# Patient Record
Sex: Female | Born: 1965
Health system: Southern US, Community
[De-identification: ages and names within clinical notes are randomized; demographics above are authoritative.]

## PROBLEM LIST (undated history)

## (undated) DIAGNOSIS — R7303 Prediabetes: Secondary | ICD-10-CM

## (undated) DIAGNOSIS — R718 Other abnormality of red blood cells: Secondary | ICD-10-CM

## (undated) DIAGNOSIS — J45909 Unspecified asthma, uncomplicated: Secondary | ICD-10-CM

## (undated) DIAGNOSIS — E669 Obesity, unspecified: Secondary | ICD-10-CM

## (undated) DIAGNOSIS — D649 Anemia, unspecified: Secondary | ICD-10-CM

## (undated) DIAGNOSIS — R0600 Dyspnea, unspecified: Secondary | ICD-10-CM

## (undated) DIAGNOSIS — E039 Hypothyroidism, unspecified: Secondary | ICD-10-CM

## (undated) DIAGNOSIS — R739 Hyperglycemia, unspecified: Secondary | ICD-10-CM

## (undated) DIAGNOSIS — F329 Major depressive disorder, single episode, unspecified: Secondary | ICD-10-CM

## (undated) DIAGNOSIS — I10 Essential (primary) hypertension: Secondary | ICD-10-CM

## (undated) DIAGNOSIS — Z9221 Personal history of antineoplastic chemotherapy: Secondary | ICD-10-CM

## (undated) DIAGNOSIS — M62838 Other muscle spasm: Secondary | ICD-10-CM

## (undated) HISTORY — DX: Major depressive disorder, single episode, unspecified: F32.9

## (undated) HISTORY — DX: Hypothyroidism, unspecified: E03.9

## (undated) HISTORY — DX: Essential (primary) hypertension: I10

## (undated) HISTORY — DX: Obesity, unspecified: E66.9

## (undated) HISTORY — DX: Other abnormality of red blood cells: R71.8

## (undated) HISTORY — DX: Prediabetes: R73.03

## (undated) HISTORY — DX: Hyperglycemia, unspecified: R73.9

## (undated) HISTORY — DX: Unspecified asthma, uncomplicated: J45.909

## (undated) HISTORY — PX: ABDOMINAL HYSTERECTOMY: SHX81

---
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
## 2007-05-08 ENCOUNTER — Encounter: Admission: RE | Admit: 2007-05-08 | Discharge: 2007-05-08 | Payer: Self-pay | Admitting: Family Medicine

## 2007-07-31 ENCOUNTER — Encounter (INDEPENDENT_AMBULATORY_CARE_PROVIDER_SITE_OTHER): Payer: Self-pay | Admitting: Obstetrics & Gynecology

## 2007-07-31 ENCOUNTER — Ambulatory Visit (HOSPITAL_COMMUNITY): Admission: RE | Admit: 2007-07-31 | Discharge: 2007-08-01 | Payer: Self-pay | Admitting: Obstetrics & Gynecology

## 2011-02-22 NOTE — Op Note (Signed)
Veronica Velazquez, Veronica Velazquez             ACCOUNT NO.:  [id number]   MEDICAL RECORD NO.:  [id number]          PATIENT TYPE:  AMB   LOCATION:  DAY                          FACILITY:  [facility]   PHYSICIAN:  Genia Del, M.D.DATE OF BIRTH:  24-Mar-1966   DATE OF PROCEDURE:  07/31/2007  DATE OF DISCHARGE:                               OPERATIVE REPORT   PREOPERATIVE DIAGNOSIS:  Uterine myomas, menorrhagia, left pelvic pains  and right vaginal cyst.   POSTOPERATIVE DIAGNOSIS:  Uterine myomas, menorrhagia, left pelvic pains  and right vaginal cyst.   PROCEDURE:  Total laparoscopic hysterectomy assisted with Da vinci robot  and removal of right vaginal cyst.   SURGEON:  Dr. Genia Del   ASSISTANT:  Lendon Colonel, MD   PROCEDURE:  Under general anesthesia with endotracheal intubation, the  patient is in lithotomy position.  She is prepped with Betadine on the  abdominal, suprapubic, vulvar and vaginal areas and draped as usual.  The bladder is catheterized and the Foley is left in place.  We then do  a vaginal exam revealing an anteverted uterus about 15 cm, mobile, no  adnexal mass.  We put the speculum in the vagina.  The anterior lip of  the cervix was grasped with a tenaculum.  Dilatation of the cervix up to  Hegar dilator #25 easily.  We do a hysterometry which reveals an  intrauterine cavity at 10 cm, then #8 Rumi is used with the [facility] ring.  This is put in place as usual and a stitch is done between the cervix  and the [facility] ring.  We removed the speculum.  Note that the right vaginal  cyst is seen.  It measures about 4-5 cm in diameter.  It will be removed  at the end of the procedure so we go to abdominal time.  We marked the  skin above the umbilicus, to make an incision about 10 cm above the  fundus, we infiltrate Marcaine 0.25% plain.  We make a 1.5 cm incision  with a scalpel.  We opened the aponeurosis with Mayo scissors under  direct vision and the parietal peritoneum  bluntly with a finger.  A  pursestring stitch of Vicryl 0 is done at the aponeurosis.  The Hasson  is inserted and the camera at that level.  We created pneumoperitoneum.  We visualize the abdominopelvic cavities.  No adhesion is present.  The  uterus is large with an intramural myoma at the fundus probably  measuring around 10 cm.  We have other fibroids.  One is very close to  the cervix on the right lateral side and the one is more posteriorly to  the left.  We measured the trocar sites.  We make a W configuration.  Three robotic trocars are inserted in an assistant port.  All those  trocars are inserted under direct vision.  We then docked the robot as  usual and put the instruments in an Endo shear scissor in the right arm.  The bipolar fenestrated clamp in the left arm and the third robotic arm  has the Prograf.  We start at  the console.  We visualize both ureters,  they are normal anatomic position.  Both ovaries are normal in size and  appearance and they will be left in place.  We start on the right side.  We cauterize and section the right round ligament, the right tube and  the right utero-ovarian ligament.  We go down and stop before the  uterine artery.  We proceed the same way on the left side.  We then  opened the visceral peritoneum over the lower uterine segment and  reclined the bladder downward.  We then switched the third robotic arm  instrument to a tenaculum so that the fibroid on the right lateral side  close to the cervix can be grasped and lifted.  This way we can reach  the right uterine artery.  It is cauterized and sectioned.  We then  cauterize and section the left uterine artery.  The uterus is blanching.  We then opened the anterior vagina after pushing the [facility] ring in as much  as possible.  We used the Endo shears scissor for that part.  We go all  around in a circumferential incision and freed the uterus completely  that way. We then leave the uterus in and  close the vagina.  We change  instruments to the needle holders and the fenestrated bipolar.  We used  Vicryl 0 on a CT one.  We close the vagina in figure-of-eights.  Hemostasis is adequate at all levels.  We decide to morcellate the  uterus laparoscopically.  We therefore remove all instruments and  undocked the robot.  We then continued by laparoscopy with the  morcellator.  The uterus is easily morcellated.  The weight is about 600  grams.  We then irrigate and suction the abdominopelvic cavities.  Hemostasis is good at all levels.  We removed the instruments, removed  the trocars under direct vision.  The pursestring stitch is attached at  the supraumbilical incision.  We close the aponeurosis on the assistant  port with a Vicryl 0.  We close the supraumbilical and the assistant  port in a subcuticular stitch of Vicryl 4-0.  We then put Dermabond on  all incisions.  Hemostasis is adequate at those levels.  We then go to  vaginal time.  The occluder is removed from the vagina.  The patient is  repositioned.  We make a vertical incision at the junction of the hymen  and the vulva with the scalpel.  We then used Allises to hold the  vaginal mucosa.  We dissect the cyst which is relatively large measuring  4 to 5 cm.  At one point the cyst ruptures and a mucusy component is  evacuated.  We completely resect the cyst.  It is sent to pathology.  We  complete hemostasis at the base with figure-of-eight with Vicryl 2-0.  The electrocautery was also used for hemostasis.  We do a rectal exam to  assure that we are away from the rectum.  We then resect the excess  vaginal mucosa and close the vaginal mucosa with a locked running suture  of Vicryl 2-0.  Hemostasis was adequate.  The estimated blood loss was  350 mL total.  The count of instruments and gauzes was complete.  The  patient was brought to recovery room in good stable status.      Genia Del, M.D.  Electronically  Signed     ML/MEDQ  D:  07/31/2007  T:  08/01/2007  Job:  [id number] 

## 2011-07-20 LAB — CBC
HCT: 28.6 — ABNORMAL LOW
HCT: 39.3
Hemoglobin: 13.1
Hemoglobin: 9.6 — ABNORMAL LOW
MCHC: 33.2
RBC: 5.35 — ABNORMAL HIGH
WBC: 10.8 — ABNORMAL HIGH
WBC: 8.2

## 2011-07-20 LAB — COMPREHENSIVE METABOLIC PANEL
ALT: 13
AST: 16
CO2: 27
Chloride: 105
GFR calc Af Amer: >60
Sodium: 140
Total Bilirubin: 0.7

## 2011-07-20 LAB — PREGNANCY, URINE: Preg Test, Ur: NEGATIVE

## 2012-04-26 ENCOUNTER — Other Ambulatory Visit: Payer: Self-pay | Admitting: Internal Medicine

## 2012-08-09 ENCOUNTER — Other Ambulatory Visit: Payer: Self-pay | Admitting: Physician Assistant

## 2012-08-10 NOTE — Telephone Encounter (Signed)
Please call patient. Was told with refill in July they need an office visit. What is the follow up plan?

## 2012-08-17 ENCOUNTER — Telehealth: Payer: Self-pay

## 2012-08-17 NOTE — Telephone Encounter (Signed)
Pt is requesting symbicort refills  [phone]  Limited english  Pt is completely out of medication.   [phone] (H)

## 2012-08-18 NOTE — Telephone Encounter (Signed)
Pt husband called and upset because we have returned call on refilled.  Chart is at nurses station for review.  Pt has not been seen since 5/12

## 2012-08-18 NOTE — Telephone Encounter (Signed)
Patient needs an appointment for any additional refills. Not seen here May 2012.

## 2012-08-19 NOTE — Telephone Encounter (Signed)
Advised pt husband that they must be seen first before we can refill

## 2012-09-12 ENCOUNTER — Ambulatory Visit (INDEPENDENT_AMBULATORY_CARE_PROVIDER_SITE_OTHER): Payer: Managed Care, Other (non HMO) | Admitting: Physician Assistant

## 2012-09-12 VITALS — BP 140/84 | HR 86 | Temp 97.5°F | Resp 18 | Ht 61.5 in | Wt 234.0 lb

## 2012-09-12 DIAGNOSIS — E039 Hypothyroidism, unspecified: Secondary | ICD-10-CM

## 2012-09-12 DIAGNOSIS — F329 Major depressive disorder, single episode, unspecified: Secondary | ICD-10-CM

## 2012-09-12 DIAGNOSIS — Z0000 Encounter for general adult medical examination without abnormal findings: Secondary | ICD-10-CM

## 2012-09-12 DIAGNOSIS — J45909 Unspecified asthma, uncomplicated: Secondary | ICD-10-CM | POA: Insufficient documentation

## 2012-09-12 DIAGNOSIS — Z1231 Encounter for screening mammogram for malignant neoplasm of breast: Secondary | ICD-10-CM

## 2012-09-12 DIAGNOSIS — E669 Obesity, unspecified: Secondary | ICD-10-CM

## 2012-09-12 DIAGNOSIS — Z1239 Encounter for other screening for malignant neoplasm of breast: Secondary | ICD-10-CM

## 2012-09-12 DIAGNOSIS — E785 Hyperlipidemia, unspecified: Secondary | ICD-10-CM

## 2012-09-12 DIAGNOSIS — I10 Essential (primary) hypertension: Secondary | ICD-10-CM | POA: Insufficient documentation

## 2012-09-12 DIAGNOSIS — R718 Other abnormality of red blood cells: Secondary | ICD-10-CM | POA: Insufficient documentation

## 2012-09-12 LAB — POCT URINALYSIS DIPSTICK
Bilirubin, UA: NEGATIVE
Ketones, UA: NEGATIVE
Nitrite, UA: NEGATIVE
Protein, UA: NEGATIVE
Spec Grav, UA: >=1.03
Urobilinogen, UA: 0.2
pH, UA: 5.5

## 2012-09-12 LAB — POCT CBC
Granulocyte percent: 58.3 %G (ref 37–80)
Hemoglobin: 15 g/dL (ref 12.2–16.2)
MCH, POC: 24.6 pg — AB (ref 27–31.2)
MPV: 10.7 fL (ref 0–99.8)
POC Granulocyte: 5.4 (ref 2–6.9)
POC LYMPH PERCENT: 34.4 %L (ref 10–50)
POC MID %: 7.3 %M (ref 0–12)
Platelet Count, POC: 417 10*3/uL (ref 142–424)
RDW, POC: 15.8 %
WBC: 9.3 10*3/uL (ref 4.6–10.2)

## 2012-09-12 LAB — POCT UA - MICROSCOPIC ONLY: Casts, Ur, LPF, POC: NEGATIVE

## 2012-09-12 LAB — GLUCOSE, POCT (MANUAL RESULT ENTRY): POC Glucose: 89 mg/dl (ref 70–99)

## 2012-09-12 MED ORDER — SIMVASTATIN 10 MG PO TABS: 10.0000 mg | ORAL_TABLET | Freq: Every day | ORAL | Status: DC

## 2012-09-12 MED ORDER — ATENOLOL-CHLORTHALIDONE 100-25 MG PO TABS: 1.0000 | ORAL_TABLET | Freq: Every day | ORAL | Status: DC

## 2012-09-12 MED ORDER — ALBUTEROL SULFATE HFA 108 (90 BASE) MCG/ACT IN AERS: 2.0000 | INHALATION_SPRAY | RESPIRATORY_TRACT | Status: DC | PRN

## 2012-09-12 MED ORDER — BUDESONIDE-FORMOTEROL FUMARATE 160-4.5 MCG/ACT IN AERO: 2.0000 | INHALATION_SPRAY | Freq: Two times a day (BID) | RESPIRATORY_TRACT | Status: DC

## 2012-09-12 NOTE — Progress Notes (Signed)
Subjective:    Patient ID: Veronica Velazquez, female    DOB: 11-13-1965, 46 y.o.   MRN: [id number]  HPI This 46 y.o. female presents for Wellness Exam.  She is s/p hysterectomy, and does not want any type of vaginal exam. Her husband translates for [place].  Past Medical History  Diagnosis Date  . Hypertension   . Asthma Uses Symbicort "PRN"  . Hypothyroidism Not on treatment  . Depression Not on treatment, doesn't want it  . Obesity   . Microcytosis   . Hyperglycemia    Past Surgical History  Procedure Date  . Abdominal hysterectomy    Prior to Admission medications   Medication Sig Start Date End Date Taking? Authorizing Provider  atenolol-chlorthalidone (TENORETIC) 100-25 MG per tablet Take 1 tablet by mouth daily. 09/12/12  Yes Teriann Livingood S Sussan Meter, PA-C  budesonide-formoterol (SYMBICORT) 160-4.5 MCG/ACT inhaler Inhale 2 puffs into the lungs 2 (two) times daily. Needs office visit 09/12/12  Yes Delano Scardino S Amita Atayde, PA-C  simvastatin (ZOCOR) 10 MG tablet Take 1 tablet (10 mg total) by mouth at bedtime. 09/12/12  Yes Onyekachi Gathright S Naz Denunzio, PA-C  albuterol (PROVENTIL HFA;VENTOLIN HFA) 108 (90 BASE) MCG/ACT inhaler Inhale 2 puffs into the lungs every 4 (four) hours as needed for wheezing (cough, shortness of breath or wheezing.). 09/12/12   Jusiah Aguayo Tessa Lerner, PA-C   No Known Allergies  History   Social History  . Marital Status: Married    Spouse Name: Ghulam    Number of Children: 3  . Years of Education: 7   Occupational History  . homemaker    Social History Main Topics  . Smoking status: Never Smoker   . Smokeless tobacco: Never Used  . Alcohol Use: No  . Drug Use: No  . Sexually Active: Yes -- Female partner(s)   Other Topics Concern  . Not on file   Social History Narrative   Lives with her husband and two daughters.  They care for their 32 year-old granddaughter during the day while her mother (their daughter) works.    Family History  Problem Relation Age of Onset  . Diabetes  Brother     drug-induced    Review of Systems  Constitutional: Negative.   HENT: Negative.   Eyes: Negative.   Respiratory: Positive for cough, shortness of breath and wheezing.        Unclear how often this occurs-perhaps once a month.  However, it sounds as though she deals with a mild level of shortness of breath daily, which has become her "normal."  She uses Symbicort "PRN."  Cardiovascular: Negative.   Gastrointestinal: Positive for constipation. Negative for nausea, vomiting, abdominal pain, diarrhea, blood in stool, abdominal distention, anal bleeding and rectal pain.  Genitourinary: Negative.        Decreased libido since TAH.  She says that she enjoys sex, but is never interested in it.  Her husband would like to increase the frequency of sex, but she is reluctant.  Musculoskeletal: Negative.   Skin: Negative.   Neurological: Negative.   Hematological: Negative.   Psychiatric/Behavioral: Negative.        Objective:   Physical Exam  Vitals reviewed. Constitutional: She is oriented to person, place, and time. Vital signs are normal. She appears well-developed and well-nourished. No distress.  HENT:  Head: Normocephalic and atraumatic.  Right Ear: Hearing, tympanic membrane, external ear and ear canal normal. No foreign bodies.  Left Ear: Hearing, tympanic membrane, external ear and ear canal normal. No foreign  bodies.  Nose: Nose normal.  Mouth/Throat: Uvula is midline, oropharynx is clear and moist and mucous membranes are normal. No oral lesions. Normal dentition. No dental abscesses or uvula swelling. No oropharyngeal exudate.  Eyes: Conjunctivae normal and EOM are normal. Pupils are equal, round, and reactive to light. Right eye exhibits no discharge. Left eye exhibits no discharge. No scleral icterus.  Fundoscopic exam:      The right eye shows no arteriolar narrowing, no AV nicking, no exudate, no hemorrhage and no papilledema. The right eye shows red reflex.The right  eye shows no venous pulsations.      The left eye shows no arteriolar narrowing, no AV nicking, no exudate, no hemorrhage and no papilledema. The left eye shows red reflex.The left eye shows no venous pulsations. Neck: Trachea normal, normal range of motion and full passive range of motion without pain. Neck supple. No spinous process tenderness and no muscular tenderness present. No mass and no thyromegaly present.  Cardiovascular: Normal rate, regular rhythm, normal heart sounds, intact distal pulses and normal pulses.   Pulmonary/Chest: Effort normal and breath sounds normal. She exhibits no tenderness and no retraction. Right breast exhibits no inverted nipple, no mass, no nipple discharge, no skin change and no tenderness. Left breast exhibits no inverted nipple, no mass, no nipple discharge, no skin change and no tenderness. Breasts are symmetrical.  Abdominal: Soft. Normal appearance and bowel sounds are normal. She exhibits no distension and no mass. There is no hepatosplenomegaly. There is no tenderness. There is no rigidity, no rebound, no guarding, no CVA tenderness, no tenderness at McBurney's point and negative Murphy's sign. No hernia.  Musculoskeletal: She exhibits no edema and no tenderness.       Cervical back: Normal.       Thoracic back: Normal.       Lumbar back: Normal.  Lymphadenopathy:       Head (right side): No tonsillar, no preauricular, no posterior auricular and no occipital adenopathy present.       Head (left side): No tonsillar, no preauricular, no posterior auricular and no occipital adenopathy present.    She has no cervical adenopathy.    She has no axillary adenopathy.       Right: No supraclavicular adenopathy present.       Left: No supraclavicular adenopathy present.  Neurological: She is alert and oriented to person, place, and time. She has normal strength and normal reflexes. No cranial nerve deficit. She exhibits normal muscle tone. Coordination and gait  normal.  Skin: Skin is warm, dry and intact. No rash noted. She is not diaphoretic. No cyanosis or erythema. Nails show no clubbing.  Psychiatric: She has a normal mood and affect. Her speech is normal and behavior is normal. Judgment and thought content normal.    Results for orders placed in visit on 09/12/12  POCT CBC      Component Value Range   WBC 9.3  4.6 - 10.2 K/uL   Lymph, poc 3.2  0.6 - 3.4   POC LYMPH PERCENT 34.4  10 - 50 %L   MID (cbc) 0.7  0 - 0.9   POC MID % 7.3  0 - 12 %M   POC Granulocyte 5.4  2 - 6.9   Granulocyte percent 58.3  37 - 80 %G   RBC 6.09 (*) 4.04 - 5.48 M/uL   Hemoglobin 15.0  12.2 - 16.2 g/dL   HCT, POC 57.8 (*) 46.9 - 47.9 %   MCV 79.8 (*)  80 - 97 fL   MCH, POC 24.6 (*) 27 - 31.2 pg   MCHC 30.9 (*) 31.8 - 35.4 g/dL   RDW, POC 16.1     Platelet Count, POC 417  142 - 424 K/uL   MPV 10.7  0 - 99.8 fL  GLUCOSE, POCT (MANUAL RESULT ENTRY)      Component Value Range   POC Glucose 89  70 - 99 mg/dl  POCT UA - MICROSCOPIC ONLY      Component Value Range   WBC, Ur, HPF, POC 7-14     RBC, urine, microscopic 0-1     Bacteria, U Microscopic 1+     Mucus, UA moderate     Epithelial cells, urine per micros 2-6     Crystals, Ur, HPF, POC neg     Casts, Ur, LPF, POC neg     Yeast, UA neg    POCT URINALYSIS DIPSTICK      Component Value Range   Color, UA yellow     Clarity, UA slightly cloudy     Glucose, UA neg     Bilirubin, UA neg     Ketones, UA neg     Spec Grav, UA >=1.030     Blood, UA neg     pH, UA 5.5     Protein, UA neg     Urobilinogen, UA 0.2     Nitrite, UA neg     Leukocytes, UA small (1+)        Assessment & Plan:   1. Routine general medical examination at a health care facility  POCT glucose (manual entry), POCT UA - Microscopic Only, POCT urinalysis dipstick  2. Asthma  albuterol (PROVENTIL HFA;VENTOLIN HFA) 108 (90 BASE) MCG/ACT inhaler, budesonide-formoterol (SYMBICORT) 160-4.5 MCG/ACT inhaler  3. HTN (hypertension)  POCT  CBC, Comprehensive metabolic panel, atenolol-chlorthalidone (TENORETIC) 100-25 MG per tablet  4. Hypothyroidism  TSH  5. Depression  Declines treatment  6. Obesity  Encouraged lifestyle changes  7. Screening for breast cancer  MM Digital Screening  8. Hyperlipidemia  Lipid panel, simvastatin (ZOCOR) 10 MG tablet   Age appropriate anticipatory guidance provided. RTC 6 months, sooner pending lab results.

## 2012-09-12 NOTE — Patient Instructions (Signed)

## 2012-09-13 ENCOUNTER — Telehealth: Payer: Self-pay

## 2012-09-13 DIAGNOSIS — J45909 Unspecified asthma, uncomplicated: Secondary | ICD-10-CM

## 2012-09-13 LAB — COMPREHENSIVE METABOLIC PANEL
BUN: 13 mg/dL (ref 6–23)
Creat: 0.71 mg/dL (ref 0.50–1.10)
Sodium: 139 mEq/L (ref 135–145)

## 2012-09-13 LAB — LIPID PANEL
HDL: 52 mg/dL (ref >39)
LDL Cholesterol: 157 mg/dL — ABNORMAL HIGH (ref 0–99)
Total CHOL/HDL Ratio: 4.4 Ratio
Triglycerides: 110 mg/dL (ref <150)
VLDL: 22 mg/dL (ref 0–40)

## 2012-09-13 NOTE — Telephone Encounter (Signed)
Pt's pharmacy called and needs [place] to call in a 90 day rx for symbicort per her insurance requirement.  [facility] [address]

## 2012-09-14 ENCOUNTER — Encounter: Payer: Self-pay | Admitting: Physician Assistant

## 2012-09-14 MED ORDER — PRAVASTATIN SODIUM 20 MG PO TABS: 20.0000 mg | ORAL_TABLET | Freq: Every day | ORAL | Status: DC

## 2012-09-14 MED ORDER — BUDESONIDE-FORMOTEROL FUMARATE 160-4.5 MCG/ACT IN AERO: 2.0000 | INHALATION_SPRAY | Freq: Two times a day (BID) | RESPIRATORY_TRACT | Status: DC

## 2012-09-14 NOTE — Addendum Note (Signed)
Addended by: Fernande Bras on: 09/14/2012 07:48 PM   Modules accepted: Orders

## 2012-09-14 NOTE — Telephone Encounter (Signed)
Rx changed from 1 with 5 RF to 3 with 1 refill/ Asiah Befort

## 2012-10-05 ENCOUNTER — Other Ambulatory Visit: Payer: Self-pay | Admitting: *Deleted

## 2012-10-05 DIAGNOSIS — E785 Hyperlipidemia, unspecified: Secondary | ICD-10-CM

## 2012-10-05 MED ORDER — SIMVASTATIN 10 MG PO TABS: 10.0000 mg | ORAL_TABLET | Freq: Every day | ORAL | Status: DC

## 2012-10-10 ENCOUNTER — Other Ambulatory Visit: Payer: Self-pay | Admitting: Radiology

## 2012-12-11 ENCOUNTER — Other Ambulatory Visit: Payer: Self-pay

## 2012-12-11 DIAGNOSIS — I10 Essential (primary) hypertension: Secondary | ICD-10-CM

## 2013-03-30 ENCOUNTER — Other Ambulatory Visit: Payer: Self-pay | Admitting: Physician Assistant

## 2013-04-09 ENCOUNTER — Ambulatory Visit: Payer: Managed Care, Other (non HMO)

## 2013-04-09 ENCOUNTER — Ambulatory Visit (INDEPENDENT_AMBULATORY_CARE_PROVIDER_SITE_OTHER): Payer: Managed Care, Other (non HMO) | Admitting: Internal Medicine

## 2013-04-09 VITALS — BP 132/80 | HR 64 | Temp 98.2°F | Resp 18 | Ht 62.0 in | Wt 226.0 lb

## 2013-04-09 DIAGNOSIS — Z609 Problem related to social environment, unspecified: Secondary | ICD-10-CM

## 2013-04-09 DIAGNOSIS — R0689 Other abnormalities of breathing: Secondary | ICD-10-CM

## 2013-04-09 DIAGNOSIS — J45901 Unspecified asthma with (acute) exacerbation: Secondary | ICD-10-CM

## 2013-04-09 DIAGNOSIS — R0989 Other specified symptoms and signs involving the circulatory and respiratory systems: Secondary | ICD-10-CM

## 2013-04-09 DIAGNOSIS — I10 Essential (primary) hypertension: Secondary | ICD-10-CM

## 2013-04-09 DIAGNOSIS — J45909 Unspecified asthma, uncomplicated: Secondary | ICD-10-CM

## 2013-04-09 DIAGNOSIS — R0602 Shortness of breath: Secondary | ICD-10-CM

## 2013-04-09 DIAGNOSIS — Z79899 Other long term (current) drug therapy: Secondary | ICD-10-CM

## 2013-04-09 DIAGNOSIS — Z789 Other specified health status: Secondary | ICD-10-CM

## 2013-04-09 LAB — POCT CBC
Hemoglobin: 14.9 g/dL (ref 12.2–16.2)
MPV: 10.8 fL (ref 0–99.8)
RBC: 5.73 M/uL — AB (ref 4.04–5.48)
WBC: 8.9 10*3/uL (ref 4.6–10.2)

## 2013-04-09 MED ORDER — BUDESONIDE-FORMOTEROL FUMARATE 160-4.5 MCG/ACT IN AERO: 2.0000 | INHALATION_SPRAY | Freq: Two times a day (BID) | RESPIRATORY_TRACT | Status: DC

## 2013-04-09 MED ORDER — ALBUTEROL SULFATE (2.5 MG/3ML) 0.083% IN NEBU
2.5000 mg | INHALATION_SOLUTION | Freq: Once | RESPIRATORY_TRACT | Status: AC
  Administered 2013-04-09: 2.5 mg via RESPIRATORY_TRACT

## 2013-04-09 MED ORDER — ALBUTEROL SULFATE HFA 108 (90 BASE) MCG/ACT IN AERS: 2.0000 | INHALATION_SPRAY | RESPIRATORY_TRACT | Status: DC | PRN

## 2013-04-09 MED ORDER — IPRATROPIUM BROMIDE 0.02 % IN SOLN
0.5000 mg | Freq: Once | RESPIRATORY_TRACT | Status: AC
  Administered 2013-04-09: 0.5 mg via RESPIRATORY_TRACT

## 2013-04-09 MED ORDER — PREDNISONE 10 MG PO TABS: ORAL_TABLET | ORAL | Status: DC

## 2013-04-09 MED ORDER — METHYLPREDNISOLONE ACETATE 80 MG/ML IJ SUSP
80.0000 mg | Freq: Once | INTRAMUSCULAR | Status: AC
  Administered 2013-04-09: 80 mg via INTRAMUSCULAR

## 2013-04-09 NOTE — Patient Instructions (Signed)

## 2013-04-09 NOTE — Progress Notes (Signed)
  Subjective:    Patient ID: Veronica Velazquez, female    DOB: [birth year], 47 y.o.   MRN: [id number]  HPI 47 y.o. Female present today with sob due to asthma. Symptom has been bothering her for the past 3 days. Has tightness in chest.  Patient has used proair inhaler, symbicort today to ease symptoms, helped 2-3 hours before symptoms returned .    Review of Systems     Objective:   Physical Exam        Assessment & Plan:

## 2013-04-09 NOTE — Progress Notes (Signed)
  Subjective:    Patient ID: Veronica Velazquez, female    DOB: [birth year], 47 y.o.   MRN: [id number]  HPI 3d progressive sob, wheezing, speaks little english. Has asthma,  established provider here. She is on 100mg  atenolol a beta blocker, this needs to be reduced. Triggers for asthma are unclear. May have minor repiratory bug, may have allergy. Has these spells about once per month, Ms. Lannette Donath CPE reviewed in detail. Needs close f/up with her to be sure meds are right. Not using symbicort daily as ordered!, overusing proventil Daughter is helpful Review of Systems Htn, obesity, language issues    Objective:   Physical Exam  Vitals reviewed. Constitutional: She is oriented to person, place, and time. She appears well-nourished.  HENT:  Right Ear: External ear normal.  Left Ear: External ear normal.  Nose: Nose normal.  Mouth/Throat: Oropharynx is clear and moist.  Eyes: EOM are normal.  Neck: Neck supple.  Cardiovascular: Normal rate, regular rhythm and normal heart sounds.   Pulmonary/Chest: Tachypnea noted. She is in respiratory distress. She has wheezes. She has rhonchi. She has no rales.  Neurological: She is alert and oriented to person, place, and time. She exhibits normal muscle tone. Coordination normal.  Psychiatric: She has a normal mood and affect. Her behavior is normal.     PFR 80 l/min Nebulizer feels much better now, no tachypnea or cough PFR 110 L/min, looks better thanthis Do 2nd neb Results for orders placed in visit on 04/09/13  POCT CBC      Result Value Range   WBC 8.9  4.6 - 10.2 K/uL   Lymph, poc 2.5  0.6 - 3.4   POC LYMPH PERCENT 28.5  10 - 50 %L   MID (cbc) 0.6  0 - 0.9   POC MID % 6.9  0 - 12 %M   POC Granulocyte 5.7  2 - 6.9   Granulocyte percent 64.6  37 - 80 %G   RBC 5.73 (*) 4.04 - 5.48 M/uL   Hemoglobin 14.9  12.2 - 16.2 g/dL   HCT, POC 40.9  81.1 - 47.9 %   MCV 81.6  80 - 97 fL   MCH, POC 26.0 (*) 27 - 31.2 pg   MCHC 31.8  31.8 - 35.4  g/dL   RDW, POC 91.4     Platelet Count, POC 368  142 - 424 K/uL   MPV 10.8  0 - 99.8 fL  GLUCOSE, POCT (MANUAL RESULT ENTRY)      Result Value Range   POC Glucose 106 (*) 70 - 99 mg/dl   [facility] reading (PRIMARY) by  Dr Perrin Maltese normal cxr      Assessment & Plan:  Reduce Beta blocker to 1/2 atenolol/chlorthal 100/25 qd Depomedrol 120mg /Prednisone taper RF inhalers/Reviewed each and how to use with daughter and mother/patient RTC tomorrow reeval/To ER if worse tonite

## 2013-05-17 ENCOUNTER — Other Ambulatory Visit: Payer: Self-pay | Admitting: Physician Assistant

## 2013-05-20 ENCOUNTER — Other Ambulatory Visit: Payer: Self-pay

## 2013-05-20 MED ORDER — ATENOLOL-CHLORTHALIDONE 100-25 MG PO TABS: 0.5000 | ORAL_TABLET | Freq: Every day | ORAL | Status: DC

## 2013-08-27 ENCOUNTER — Ambulatory Visit: Payer: Self-pay

## 2013-09-29 ENCOUNTER — Other Ambulatory Visit: Payer: Self-pay | Admitting: Physician Assistant

## 2013-10-29 ENCOUNTER — Other Ambulatory Visit: Payer: Self-pay | Admitting: Physician Assistant

## 2014-01-25 ENCOUNTER — Ambulatory Visit (INDEPENDENT_AMBULATORY_CARE_PROVIDER_SITE_OTHER): Payer: Managed Care, Other (non HMO) | Admitting: Family Medicine

## 2014-01-25 VITALS — BP 138/82 | HR 78 | Temp 98.2°F | Resp 16 | Ht 62.0 in | Wt 228.6 lb

## 2014-01-25 DIAGNOSIS — I10 Essential (primary) hypertension: Secondary | ICD-10-CM

## 2014-01-25 DIAGNOSIS — E785 Hyperlipidemia, unspecified: Secondary | ICD-10-CM

## 2014-01-25 DIAGNOSIS — Z131 Encounter for screening for diabetes mellitus: Secondary | ICD-10-CM

## 2014-01-25 DIAGNOSIS — E039 Hypothyroidism, unspecified: Secondary | ICD-10-CM

## 2014-01-25 DIAGNOSIS — J45909 Unspecified asthma, uncomplicated: Secondary | ICD-10-CM

## 2014-01-25 LAB — POCT CBC
Granulocyte percent: 58.4 % (ref 37–80)
HCT, POC: 43.3 % (ref 37.7–47.9)
Hemoglobin: 13.7 g/dL (ref 12.2–16.2)
Lymph, poc: 2.4 (ref 0.6–3.4)
MCH, POC: 25.6 pg — AB (ref 27–31.2)
MCHC: 31.6 g/dL — AB (ref 31.8–35.4)
MCV: 80.7 fL (ref 80–97)
MID (cbc): 0.5 (ref 0–0.9)
MPV: 11.1 fL (ref 0–99.8)
POC Granulocyte: 4 (ref 2–6.9)
POC LYMPH PERCENT: 34.4 %L (ref 10–50)
POC MID %: 7.2 % (ref 0–12)
Platelet Count, POC: 351 10*3/uL (ref 142–424)
RBC: 5.36 M/uL (ref 4.04–5.48)
RDW, POC: 16.1 %
WBC: 6.9 10*3/uL (ref 4.6–10.2)

## 2014-01-25 LAB — POCT GLYCOSYLATED HEMOGLOBIN (HGB A1C): Hemoglobin A1C: 5.6

## 2014-01-25 MED ORDER — ALBUTEROL SULFATE HFA 108 (90 BASE) MCG/ACT IN AERS: 2.0000 | INHALATION_SPRAY | RESPIRATORY_TRACT | Status: DC | PRN

## 2014-01-25 MED ORDER — ATENOLOL-CHLORTHALIDONE 100-25 MG PO TABS: 0.5000 | ORAL_TABLET | Freq: Every day | ORAL | Status: DC

## 2014-01-25 MED ORDER — SIMVASTATIN 10 MG PO TABS: 10.0000 mg | ORAL_TABLET | Freq: Every day | ORAL | Status: DC

## 2014-01-25 MED ORDER — BUDESONIDE-FORMOTEROL FUMARATE 160-4.5 MCG/ACT IN AERO: 2.0000 | INHALATION_SPRAY | Freq: Two times a day (BID) | RESPIRATORY_TRACT | Status: DC

## 2014-01-25 NOTE — Progress Notes (Signed)
Chief Complaint:  Chief Complaint  Patient presents with  . medication refills    traveling to [place]. needs refills to cover 6-9 months; would like a refill on depression meds( long time ago) and levothyroxine (2013)  . medication refills    as well on medication list    HPI: Veronica Velazquez is a 48 y.o. female who is here for: She and her husband will be going to near [place], [place] for 1 month in 2 weeks.   1. HTN-on tenoretic 100/25 mg she takes 2-3 times per week. She does not take BP at home.  She works 11 hour shifts and soemtimes she forgets tot ake her meds because of tiredness.  2. Hyporthyroid -she has been out of her meds for 2-3 years, She was on Levothyroxine 50 mcg daily  3. Asthma sxs - she takes her albuterol inhaler prn, she does not usually take it more than 2-3 days every 4-6 months.  4. She is also noncompliant with lipitor 5. She has anxiety when she goes into a bug building sometimes, seh sits in the rooms she feels scared. She She denise shurting herself, eating more or sleeping more. 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 Hypertension   . Asthma   . Hypothyroidism   . Depression   . Obesity   . Microcytosis   . Hyperglycemia    Past Surgical History  Procedure Laterality Date  . Abdominal hysterectomy     History   Social History  . Marital Status: Married    Spouse Name: Veronica Velazquez    Number of Children: 3  . Years of Education: 7   Occupational History  . homemaker    Social History Main Topics  . Smoking status: Never Smoker   . Smokeless tobacco: Never Used  . Alcohol Use: No  . Drug Use: No  . Sexual Activity: Yes    Partners: Male   Other Topics Concern  . None   Social History Narrative   Lives with her husband and two daughters.  They care for their 86 year-old granddaughter during the day while her mother (their daughter) works.   Family History  Problem Relation  Age of Onset  . Diabetes Brother     drug-induced   No Known Allergies Prior to Admission medications   Medication Sig Start Date End Date Taking? Authorizing Provider  albuterol (PROVENTIL HFA;VENTOLIN HFA) 108 (90 BASE) MCG/ACT inhaler Inhale 2 puffs into the lungs every 4 (four) hours as needed for wheezing (cough, shortness of breath or wheezing.). 04/09/13  Yes Orma Flaming, MD  atenolol-chlorthalidone (TENORETIC) 100-25 MG per tablet Take 0.5 tablets by mouth daily. 05/20/13  Yes Chelle S Jeffery, PA-C  simvastatin (ZOCOR) 10 MG tablet Take 1 tablet (10 mg total) by mouth daily. PATIENT NEEDS OFFICE VISIT FOR ADDITIONAL REFILLS - 2nd NOTICE 10/29/13  Yes Ryan M Dunn, PA-C  budesonide-formoterol [facility]) 160-4.5 MCG/ACT inhaler Inhale 2 puffs into the lungs 2 (two) times daily. 04/09/13   Orma Flaming, MD  pravastatin (PRAVACHOL) 20 MG tablet Take 1 tablet (20 mg total) by mouth daily. 09/14/12   Chelle Janalee Dane, PA-C  predniSONE (DELTASONE) 10 MG tablet 6-6-5-5-4-4-3-3-2-2-1-1 po pc for breathing 04/09/13   Orma Flaming, MD     ROS: The patient denies fevers, chills, night sweats, unintentional weight loss, chest pain, palpitations, wheezing, dyspnea on exertion, nausea, vomiting, abdominal  pain, dysuria, hematuria, melena, numbness, weakness, or tingling.   All other systems have been reviewed and were otherwise negative with the exception of those mentioned in the HPI and as above.    PHYSICAL EXAM: Filed Vitals:   01/25/14 1728  BP: 138/82  Pulse: 78  Temp: 98.2 F (36.8 C)  Resp: 16   Filed Vitals:   01/25/14 1728  Height: 5\' 2"  (1.575 m)  Weight: 228 lb 9.6 oz (103.692 kg)   Body mass index is 41.8 kg/(m^2).  General: Alert, no acute distress HEENT:  Normocephalic, atraumatic, oropharynx patent. EOMI, PERRLA, no thrypidmegaly Cardiovascular:  Regular rate and rhythm, no rubs murmurs or gallops.  No Carotid bruits, radial pulse intact. No pedal edema.  Respiratory:  Clear to auscultation bilaterally.  No wheezes, rales, or rhonchi.  No cyanosis, no use of accessory musculature GI: No organomegaly, abdomen is soft and non-tender, positive bowel sounds.  No masses. Skin: No rashes. Neurologic: Facial musculature symmetric. Psychiatric: Patient is appropriate throughout our interaction. Lymphatic: No cervical lymphadenopathy Musculoskeletal: Gait intact.   LABS: Results for orders placed in visit on 01/25/14  POCT CBC      Result Value Ref Range   WBC 6.9  4.6 - 10.2 K/uL   Lymph, poc 2.4  0.6 - 3.4   POC LYMPH PERCENT 34.4  10 - 50 %L   MID (cbc) 0.5  0 - 0.9   POC MID % 7.2  0 - 12 %M   POC Granulocyte 4.0  2 - 6.9   Granulocyte percent 58.4  37 - 80 %G   RBC 5.36  4.04 - 5.48 M/uL   Hemoglobin 13.7  12.2 - 16.2 g/dL   HCT, POC 43.3  37.7 - 47.9 %   MCV 80.7  80 - 97 fL   MCH, POC 25.6 (*) 27 - 31.2 pg   MCHC 31.6 (*) 31.8 - 35.4 g/dL   RDW, POC 16.1     Platelet Count, POC 351  142 - 424 K/uL   MPV 11.1  0 - 99.8 fL  POCT GLYCOSYLATED HEMOGLOBIN (HGB A1C)      Result Value Ref Range   Hemoglobin A1C 5.6       EKG/XRAY:   Primary read interpreted by Dr. Marin Comment at [facility].   ASSESSMENT/PLAN: Encounter Diagnoses  Name Primary?  . HTN (hypertension) Yes  . Other and unspecified hyperlipidemia   . Unspecified hypothyroidism   . Screening for diabetes mellitus   . Unspecified asthma(493.90)    Noncompliant [name] female who is here for med refills , she will be leaving for [place] and would like her medicines. She will be gone for over 1 month.  Refilled meds Will hold off on thyroid medicine since she has not taken it in 2-3 years and I do not know where her TSH level is F/u prn, otherwise in 3 months for fasting blood work when she return from [place]  Gross sideeffects, risk and benefits, and alternatives of medications d/w patient. Patient is aware that all medications have potential sideeffects and we are unable to predict every  sideeffect or drug-drug interaction that may occur.  Glenford Bayley, DO 01/25/2014 6:49 PM

## 2014-01-26 LAB — COMPREHENSIVE METABOLIC PANEL
AST: 14 U/L (ref 0–37)
Albumin: 4 g/dL (ref 3.5–5.2)
Alkaline Phosphatase: 81 U/L (ref 39–117)
BUN: 13 mg/dL (ref 6–23)
Creat: 0.6 mg/dL (ref 0.50–1.10)

## 2014-01-26 LAB — COMPREHENSIVE METABOLIC PANEL WITH GFR
ALT: 10 U/L (ref 0–35)
CO2: 26 meq/L (ref 19–32)
Calcium: 9.2 mg/dL (ref 8.4–10.5)
Chloride: 104 meq/L (ref 96–112)
Glucose, Bld: 103 mg/dL — ABNORMAL HIGH (ref 70–99)
Potassium: 3.9 meq/L (ref 3.5–5.3)
Sodium: 139 meq/L (ref 135–145)
Total Bilirubin: 0.3 mg/dL (ref 0.2–1.2)
Total Protein: 6.8 g/dL (ref 6.0–8.3)

## 2014-01-26 LAB — TSH: TSH: 7.266 u[IU]/mL — ABNORMAL HIGH (ref 0.350–4.500)

## 2014-01-26 LAB — LDL CHOLESTEROL, DIRECT: Direct LDL: 162 mg/dL — ABNORMAL HIGH

## 2014-01-28 ENCOUNTER — Telehealth: Payer: Self-pay

## 2014-01-28 MED ORDER — FLUTICASONE-SALMETEROL 100-50 MCG/DOSE IN AEPB: 1.0000 | INHALATION_SPRAY | Freq: Two times a day (BID) | RESPIRATORY_TRACT | Status: DC

## 2014-01-28 NOTE — Telephone Encounter (Signed)
Lets try advair, she has been on symbicort prior

## 2014-01-28 NOTE — Addendum Note (Signed)
Addended by: Elwyn Reach A on: 01/28/2014 03:29 PM   Modules accepted: Orders

## 2014-01-28 NOTE — Telephone Encounter (Signed)
Notified pt on VM about change in medication and new Rx sent in.

## 2014-01-28 NOTE — Telephone Encounter (Signed)
PA is needed for symbicort. When I completed form on covermymeds, it advised that pt must have tried/failed or have contraindication for trying both of the preferred alternatives before PA would be approved. Pt, according to EPIC and paper records has only tried Asmanex (which is not even one of the preferred) before symbicort. The two altern are Advair and Dulera. Dr Marin Comment, do you want to Rx one of these?

## 2014-01-29 ENCOUNTER — Telehealth: Payer: Self-pay

## 2014-01-29 DIAGNOSIS — F411 Generalized anxiety disorder: Secondary | ICD-10-CM

## 2014-01-29 DIAGNOSIS — E039 Hypothyroidism, unspecified: Secondary | ICD-10-CM

## 2014-01-29 NOTE — Telephone Encounter (Signed)
PATIENTS DAUGHTER CALLED AND ASKED FOR REFILL ON PRESCRIPTION FOR DEPRESSION MEDICATION AND THYROID MEDICATION, DAUGHTER DOES NOT KNOW WHAT THE NAMES OF THESE MEDICATIONS ARE.

## 2014-01-30 ENCOUNTER — Encounter: Payer: Self-pay | Admitting: Family Medicine

## 2014-01-30 MED ORDER — HYDROXYZINE HCL 10 MG PO TABS: 10.0000 mg | ORAL_TABLET | Freq: Three times a day (TID) | ORAL | Status: DC | PRN

## 2014-01-30 MED ORDER — LEVOTHYROXINE SODIUM 25 MCG PO TABS: ORAL_TABLET | ORAL | Status: DC

## 2014-01-30 NOTE — Telephone Encounter (Signed)
LM on phone re labs and paln. I do not want her to be on depression meds at this time, she has anxiety issues and she is noncomplaint. Will put her on low dose Levothryoxine and see how she does. She was previously on 50 mcg based on a old tattered bottle that she had, I will start her on 25 mcg and see how she does since she is noncompliant and will be traveling. She has never been on depression medicine. We briefly talked about anxiety and I think she has more anxiety about certain things. I can give her  vistaril for now so she can take as needed. In 2 months when she returns for fasting blood owrk for lipids, TSH and also anxiety/depression re-evaluation.

## 2014-01-30 NOTE — Telephone Encounter (Signed)
Pt has not ever been prescribed an antidepressive or thyroid medication through our office.  I do see in the the last OV Dr. Marin Comment was going to refill these medications. Please advise.

## 2014-01-30 NOTE — Telephone Encounter (Signed)
Dr. Marin Comment - this pt is requesting RF of thyroid and depression medication.  TSH was elevated at 4/18 visit, but I don't see where you discussed depression medication, and I don't see any antidepressants on her med list.  Please advise

## 2014-04-29 ENCOUNTER — Other Ambulatory Visit: Payer: Self-pay | Admitting: Family Medicine

## 2014-05-24 ENCOUNTER — Other Ambulatory Visit: Payer: Self-pay | Admitting: Family Medicine

## 2014-06-01 ENCOUNTER — Ambulatory Visit (INDEPENDENT_AMBULATORY_CARE_PROVIDER_SITE_OTHER): Payer: Managed Care, Other (non HMO) | Admitting: Family Medicine

## 2014-06-01 VITALS — BP 130/88 | HR 65 | Temp 97.8°F | Resp 16 | Ht 62.0 in | Wt 224.0 lb

## 2014-06-01 DIAGNOSIS — I10 Essential (primary) hypertension: Secondary | ICD-10-CM

## 2014-06-01 DIAGNOSIS — Z8249 Family history of ischemic heart disease and other diseases of the circulatory system: Secondary | ICD-10-CM

## 2014-06-01 DIAGNOSIS — E785 Hyperlipidemia, unspecified: Secondary | ICD-10-CM

## 2014-06-01 DIAGNOSIS — B351 Tinea unguium: Secondary | ICD-10-CM

## 2014-06-01 DIAGNOSIS — Z23 Encounter for immunization: Secondary | ICD-10-CM

## 2014-06-01 DIAGNOSIS — M79676 Pain in unspecified toe(s): Secondary | ICD-10-CM

## 2014-06-01 DIAGNOSIS — J45909 Unspecified asthma, uncomplicated: Secondary | ICD-10-CM

## 2014-06-01 DIAGNOSIS — M79609 Pain in unspecified limb: Secondary | ICD-10-CM

## 2014-06-01 DIAGNOSIS — E669 Obesity, unspecified: Secondary | ICD-10-CM

## 2014-06-01 LAB — COMPREHENSIVE METABOLIC PANEL
ALK PHOS: 72 U/L (ref 39–117)
ALT: 10 U/L (ref 0–35)
AST: 13 U/L (ref 0–37)
Albumin: 3.9 g/dL (ref 3.5–5.2)
BUN: 10 mg/dL (ref 6–23)
CO2: 25 mEq/L (ref 19–32)
Calcium: 9.4 mg/dL (ref 8.4–10.5)
Chloride: 103 mEq/L (ref 96–112)
Creat: 0.52 mg/dL (ref 0.50–1.10)
Glucose, Bld: 111 mg/dL — ABNORMAL HIGH (ref 70–99)
POTASSIUM: 3.6 meq/L (ref 3.5–5.3)
SODIUM: 137 meq/L (ref 135–145)
TOTAL PROTEIN: 6.6 g/dL (ref 6.0–8.3)
Total Bilirubin: 0.5 mg/dL (ref 0.2–1.2)

## 2014-06-01 LAB — LIPID PANEL
CHOL/HDL RATIO: 3.7 ratio
Cholesterol: 194 mg/dL (ref 0–200)
HDL: 53 mg/dL (ref >39)
LDL Cholesterol: 118 mg/dL — ABNORMAL HIGH (ref 0–99)
Triglycerides: 114 mg/dL (ref <150)
VLDL: 23 mg/dL (ref 0–40)

## 2014-06-01 LAB — TSH: TSH: 4.442 u[IU]/mL (ref 0.350–4.500)

## 2014-06-01 NOTE — Patient Instructions (Addendum)
I am so sorry about the loss of your brother!  We will refer you to cardiology to make sure that your heart is doing well. They will likely do a "stress test" of your heart to look for any early blockages in your heart arteries.  If you are not able to make your cardiology appt you may certainly call and reschedule.    As soon as you are cleared by cardiology I want you to start an exercise program of walking 20- 30 minutes a day (working up to more) most days of the week.  We will also discuss your other labs and any medication changes that are needed.   Assuming your liver blood tests look ok I will call in lamisil for your toenails- you can take this for 12 weeks.   We will likely also have you go ahead and start taking medication for your thyroid, but I will call you with these results first.

## 2014-06-01 NOTE — Progress Notes (Addendum)
[facility] [address] [place] [address] [phone]  Date:  06/01/2014   Name:  Veronica Velazquez   DOB:  03/04/1966   MRN:  [id number]  PCP:  No PCP Per Patient    Chief Complaint: Hypertension   History of Present Illness:  Veronica Velazquez is a 48 y.o. very pleasant female patient who presents with the following:  She is here today for follow-up.  She was here a few months ago for medication refills. She takes medications for HTN. She also has been told she is at risk for DM but does not actually have DM.  There is a family history of DM.  Most recent A1c was 5.6 in April.  Also, she has levothyroxine but does not take it.  Her brother died 3 weeks ago of an MI.  There have been other cases of early CAD in her family.  This has her concerned and she would like to make sure that her heart is ok.   She is fasting this am.   No chest pain as far as we know.  She does not get much exercise.    Veronica Velazquez is from [place], her english is limited.  Her daughter helps with translation today.   Patient Active Problem List   Diagnosis Date Noted  . Asthma 09/12/2012  . HTN (hypertension) 09/12/2012  . Hypothyroidism   . Depression   . Obesity   . Microcytosis     Past Medical History  Diagnosis Date  . Hypertension   . Asthma   . Hypothyroidism   . Depression   . Obesity   . Microcytosis   . Hyperglycemia     Past Surgical History  Procedure Laterality Date  . Abdominal hysterectomy      History  Substance Use Topics  . Smoking status: Never Smoker   . Smokeless tobacco: Never Used  . Alcohol Use: No    Family History  Problem Relation Age of Onset  . Diabetes Brother     drug-induced    No Known Allergies  Medication list has been reviewed and updated.  Current Outpatient Prescriptions on File Prior to Visit  Medication Sig Dispense Refill  . atenolol-chlorthalidone (TENORETIC) 100-25 MG per tablet Take 0.5 tablets by mouth  daily.  90 tablet  1  . Fluticasone-Salmeterol (ADVAIR) 100-50 MCG/DOSE AEPB Inhale 1 puff into the lungs 2 (two) times daily.  3 each  1  . levothyroxine (SYNTHROID, LEVOTHROID) 25 MCG tablet Take 1 tablet (25 mcg total) by mouth daily before breakfast. Do not take with other meds. PATIENT NEEDS OFFICE VISIT/LABS FOR ADDITIONAL REFILLS  30 tablet  0  . levothyroxine (SYNTHROID, LEVOTHROID) 25 MCG tablet TAKE 1 TAB DAILY IN THE MORNING WITH WATER ON AN EMPTY STOMACH, DO NOT TAKE WITH OTHER MEDS  90 tablet  0  . simvastatin (ZOCOR) 10 MG tablet Take 1 tablet (10 mg total) by mouth daily.  90 tablet  1  . albuterol (PROVENTIL HFA;VENTOLIN HFA) 108 (90 BASE) MCG/ACT inhaler Inhale 2 puffs into the lungs every 4 (four) hours as needed for wheezing (cough, shortness of breath or wheezing.).  1 Inhaler  5  . budesonide-formoterol (SYMBICORT) 160-4.5 MCG/ACT inhaler Inhale 2 puffs into the lungs 2 (two) times daily.  3 Inhaler  1  . hydrOXYzine (ATARAX/VISTARIL) 10 MG tablet Take 1 tablet (10 mg total) by mouth 3 (three) times daily as needed. For anxiety. May make you sleepy.  [place]  tablet  2   No current facility-administered medications on file prior to visit.    Review of Systems:  As per HPI- otherwise negative.   Physical Examination: Filed Vitals:   06/01/14 0827  BP: 130/88  Pulse: 65  Temp: 97.8 F (36.6 C)  Resp: 16   Filed Vitals:   06/01/14 0827  Height: 5\' 2"  (1.575 m)  Weight: 224 lb (101.606 kg)   Body mass index is 40.96 kg/(m^2). Ideal Body Weight: Weight in (lb) to have BMI = 25: 136.4  GEN: WDWN, NAD, Non-toxic, A & O x 3, obese, looks well but is still tearful when thinking about the death of her brother.   HEENT: Atraumatic, Normocephalic. Neck supple. No masses, No LAD. Ears and Nose: No external deformity. CV: RRR, No M/G/R. No JVD. No thrill. No extra heart sounds. PULM: CTA B, no wheezes, crackles, rhonchi. No retractions. No resp. distress. No accessory muscle  use. ABD: S, NT, ND EXTR: No c/c/e NEURO Normal gait.  PSYCH: Normally interactive. Conversant. Not depressed or anxious appearing.  Calm demeanor.  She is also concerned about some thickened, crumbling toenails.  Mostly the great toes are affected.  They can be uncomfortable for her   Assessment and Plan: Family history of premature CAD - Plan: Lipid panel, Ambulatory referral to Cardiology  Obesity, unspecified - Plan: TSH, Ambulatory referral to Cardiology  Essential hypertension - Plan: TSH, Ambulatory referral to Cardiology  Immunization due - Plan: Flu Vaccine QUAD 36+ mos IM  Pain due to onychomycosis of toenail - Plan: Comprehensive metabolic panel  Will refer to cardiology for possible stress test given her risk factors and fhx of early CAD BP control is ok, refill medications Check lipids Flu shot.   She would like to take lamisil for fungal toenails- assuming her LFTS are ok I will rx this for her See patient instructions for more details.    Signed Lamar Blinks, MD  [phone]- called 8/24 and spoke with her daughter.  See lab letter.  Ok to use lamisil for her toenails for 12 weeks. Will send in all refills as requested.

## 2014-06-02 ENCOUNTER — Encounter: Payer: Self-pay | Admitting: Family Medicine

## 2014-06-02 MED ORDER — ATENOLOL-CHLORTHALIDONE 100-25 MG PO TABS: 0.5000 | ORAL_TABLET | Freq: Every day | ORAL | Status: DC

## 2014-06-02 MED ORDER — TERBINAFINE HCL 250 MG PO TABS: 250.0000 mg | ORAL_TABLET | Freq: Every day | ORAL | Status: DC

## 2014-06-02 MED ORDER — FLUTICASONE-SALMETEROL 100-50 MCG/DOSE IN AEPB: 1.0000 | INHALATION_SPRAY | Freq: Two times a day (BID) | RESPIRATORY_TRACT | Status: DC

## 2014-06-02 MED ORDER — ALBUTEROL SULFATE HFA 108 (90 BASE) MCG/ACT IN AERS: 2.0000 | INHALATION_SPRAY | RESPIRATORY_TRACT | Status: DC | PRN

## 2014-06-02 MED ORDER — SIMVASTATIN 10 MG PO TABS: 10.0000 mg | ORAL_TABLET | Freq: Every day | ORAL | Status: DC

## 2014-06-02 NOTE — Addendum Note (Signed)
Addended by: Lamar Blinks C on: 06/02/2014 09:29 AM   Modules accepted: Orders, Medications

## 2014-08-28 ENCOUNTER — Other Ambulatory Visit: Payer: Self-pay | Admitting: Family Medicine

## 2014-10-17 ENCOUNTER — Other Ambulatory Visit: Payer: Self-pay | Admitting: Family Medicine

## 2015-02-03 ENCOUNTER — Other Ambulatory Visit: Payer: Self-pay | Admitting: Physician Assistant

## 2015-02-06 ENCOUNTER — Other Ambulatory Visit: Payer: Self-pay | Admitting: Family Medicine

## 2015-03-06 ENCOUNTER — Other Ambulatory Visit: Payer: Self-pay | Admitting: Physician Assistant

## 2015-03-26 ENCOUNTER — Other Ambulatory Visit: Payer: Self-pay | Admitting: Physician Assistant

## 2015-05-15 ENCOUNTER — Ambulatory Visit (INDEPENDENT_AMBULATORY_CARE_PROVIDER_SITE_OTHER): Payer: Managed Care, Other (non HMO) | Admitting: Family Medicine

## 2015-05-15 VITALS — BP 120/68 | HR 69 | Temp 97.4°F | Resp 18 | Ht 62.5 in | Wt 230.0 lb

## 2015-05-15 DIAGNOSIS — Z130 Encounter for screening for diseases of the blood and blood-forming organs and certain disorders involving the immune mechanism: Secondary | ICD-10-CM | POA: Diagnosis not present

## 2015-05-15 DIAGNOSIS — I10 Essential (primary) hypertension: Secondary | ICD-10-CM | POA: Diagnosis not present

## 2015-05-15 DIAGNOSIS — J4520 Mild intermittent asthma, uncomplicated: Secondary | ICD-10-CM

## 2015-05-15 DIAGNOSIS — E785 Hyperlipidemia, unspecified: Secondary | ICD-10-CM

## 2015-05-15 DIAGNOSIS — M791 Myalgia, unspecified site: Secondary | ICD-10-CM

## 2015-05-15 LAB — COMPREHENSIVE METABOLIC PANEL
ALT: 13 U/L (ref 6–29)
AST: 16 U/L (ref 10–35)
Albumin: 3.9 g/dL (ref 3.6–5.1)
Alkaline Phosphatase: 72 U/L (ref 33–130)
BUN: 20 mg/dL (ref 7–25)
CO2: 26 mmol/L (ref 20–31)
Calcium: 9.7 mg/dL (ref 8.6–10.4)
Chloride: 102 mmol/L (ref 98–110)
Creat: 0.63 mg/dL (ref 0.50–1.05)
Glucose, Bld: 90 mg/dL (ref 65–99)
Potassium: 4.2 mmol/L (ref 3.5–5.3)
Sodium: 136 mmol/L (ref 135–146)
Total Bilirubin: 0.4 mg/dL (ref 0.2–1.2)
Total Protein: 6.9 g/dL (ref 6.1–8.1)

## 2015-05-15 LAB — LIPID PANEL
Cholesterol: 240 mg/dL — ABNORMAL HIGH (ref 125–200)
HDL: 55 mg/dL (ref >=46)
LDL Cholesterol: 145 mg/dL — ABNORMAL HIGH (ref <130)
Total CHOL/HDL Ratio: 4.4 Ratio (ref <=5.0)
Triglycerides: 200 mg/dL — ABNORMAL HIGH (ref <150)
VLDL: 40 mg/dL — ABNORMAL HIGH (ref <30)

## 2015-05-15 LAB — CBC
HCT: 38.9 % (ref 36.0–46.0)
Hemoglobin: 13.3 g/dL (ref 12.0–15.0)
MCH: 26 pg (ref 26.0–34.0)
MCHC: 34.2 g/dL (ref 30.0–36.0)
MCV: 76 fL — AB (ref 78.0–100.0)
MPV: 10.2 fL (ref 8.6–12.4)
Platelets: 332 10*3/uL (ref 150–400)
RBC: 5.12 MIL/uL — ABNORMAL HIGH (ref 3.87–5.11)
RDW: 14.9 % (ref 11.5–15.5)
WBC: 8.8 10*3/uL (ref 4.0–10.5)

## 2015-05-15 LAB — CK: CK TOTAL: 102 U/L (ref 7–177)

## 2015-05-15 MED ORDER — LISINOPRIL 5 MG PO TABS: 5.0000 mg | ORAL_TABLET | Freq: Every day | ORAL | Status: DC

## 2015-05-15 MED ORDER — ALBUTEROL SULFATE HFA 108 (90 BASE) MCG/ACT IN AERS: 2.0000 | INHALATION_SPRAY | RESPIRATORY_TRACT | Status: DC | PRN

## 2015-05-15 MED ORDER — ATENOLOL-CHLORTHALIDONE 100-25 MG PO TABS: ORAL_TABLET | ORAL | Status: DC

## 2015-05-15 MED ORDER — SIMVASTATIN 10 MG PO TABS: 10.0000 mg | ORAL_TABLET | Freq: Every day | ORAL | Status: DC

## 2015-05-15 NOTE — Patient Instructions (Signed)
We will refer you to physical therapy to work on your left shoulder stiffness I will be in touch with your labs Continue your current medications for your blood pressure and cholesterol  It would also be a good idea to schedule a complete physical soon to go over your general health   Please call and schedule a mammogram.  There are several options in town, one good choice is:  [facility] ?  Address: [address], [place], [place] [address]  [phone]

## 2015-05-15 NOTE — Progress Notes (Signed)
[facility] [address], [place] [address] [phone]  Date:  05/15/2015   Name:  Veronica Velazquez   DOB:  [birth year]   MRN:  [id number]  PCP:  No PCP Per Patient    Chief Complaint: Medication Refill   History of Present Illness:  Veronica Velazquez is a 49 y.o. very pleasant female patient who presents with the following:  She is here for refills today.  Doing well.  She has had tea and an orange so far today Dr. Einar Gip has her on lisinopril 10 mg as well- needs this refilled She uses albuterol maybe once a month for wheezing- she is NOT using advair She otherwise has no complaints today No recent CPE She also has noted some aching in her bilateral upper arms for a few weeks. No other muscle or body aches   BP Readings from Last 3 Encounters:  05/15/15 120/68  06/01/14 130/88  01/25/14 138/82    Patient Active Problem List   Diagnosis Date Noted  . Asthma 09/12/2012  . HTN (hypertension) 09/12/2012  . Hypothyroidism   . Depression   . Obesity   . Microcytosis     Past Medical History  Diagnosis Date  . Hypertension   . Asthma   . Hypothyroidism   . Depression   . Obesity   . Microcytosis   . Hyperglycemia     Past Surgical History  Procedure Laterality Date  . Abdominal hysterectomy      History  Substance Use Topics  . Smoking status: Never Smoker   . Smokeless tobacco: Never Used  . Alcohol Use: No    Family History  Problem Relation Age of Onset  . Diabetes Brother     drug-induced    No Known Allergies  Medication list has been reviewed and updated.  Current Outpatient Prescriptions on File Prior to Visit  Medication Sig Dispense Refill  . albuterol (PROVENTIL HFA;VENTOLIN HFA) 108 (90 BASE) MCG/ACT inhaler Inhale 2 puffs into the lungs every 4 (four) hours as needed for wheezing (cough, shortness of breath or wheezing.). 1 Inhaler 5  . atenolol-chlorthalidone (TENORETIC) 100-25 MG per tablet TAKE 1/2 TABLET BY  MOUTH EVERY DAY "NO MORE REFILLS WITHOUT OFFICE VISIT" 15 tablet 0  . atenolol-chlorthalidone (TENORETIC) 100-25 MG per tablet TAKE 1/2 TABLET BY MOUTH EVERY DAY (PATIENT NEEDS OFFICE VISIT FOR FURTHER REFILLS) 7 tablet 0  . Fluticasone-Salmeterol (ADVAIR) 100-50 MCG/DOSE AEPB Inhale 1 puff into the lungs 2 (two) times daily. 3 each 3  . hydrOXYzine (ATARAX/VISTARIL) 10 MG tablet Take 1 tablet (10 mg total) by mouth 3 (three) times daily as needed. For anxiety. May make you sleepy. 30 tablet 2  . simvastatin (ZOCOR) 10 MG tablet Take 1 tablet (10 mg total) by mouth daily. 90 tablet 3  . terbinafine (LAMISIL) 250 MG tablet Take 1 tablet (250 mg total) by mouth daily. Take for 12 weeks for toenail fungus 90 tablet 0   No current facility-administered medications on file prior to visit.    Review of Systems:  As per HPI- otherwise negative.   Physical Examination: Filed Vitals:   05/15/15 1327  BP: 120/68  Pulse: 69  Temp: 97.4 F (36.3 C)  Resp: 18   Filed Vitals:   05/15/15 1327  Height: 5' 2.5" (1.588 m)  Weight: 230 lb (104.327 kg)   Body mass index is 41.37 kg/(m^2). Ideal Body Weight: Weight in (lb) to have BMI = 25: 138.6  GEN:  WDWN, NAD, Non-toxic, A & O x 3, obese, looks well HEENT: Atraumatic, Normocephalic. Neck supple. No masses, No LAD. Ears and Nose: No external deformity. CV: RRR, No M/G/R. No JVD. No thrill. No extra heart sounds. PULM: CTA B, no wheezes, crackles, rhonchi. No retractions. No resp. distress. No accessory muscle use. EXTR: No c/c/e NEURO Normal gait.  PSYCH: Normally interactive. Conversant. Not depressed or anxious appearing.  Calm demeanor.  Left shoulder: mild restriction of shoulder flexion and abduction No tenderness with palpation of bilateral arm muscles    Assessment and Plan: Essential hypertension - Plan: atenolol-chlorthalidone (TENORETIC) 100-25 MG per tablet, Comprehensive metabolic panel, Hemoglobin A1c, lisinopril  (PRINIVIL,ZESTRIL) 5 MG tablet  Dyslipidemia - Plan: simvastatin (ZOCOR) 10 MG tablet, Lipid panel  Asthma, chronic, mild intermittent, uncomplicated - Plan: albuterol (PROVENTIL HFA;VENTOLIN HFA) 108 (90 BASE) MCG/ACT inhaler  Muscle ache - Plan: CK, Ambulatory referral to Physical Therapy  Screening for deficiency anemia - Plan: CBC  Refilled her medications, check labs as above Encouraged her to come in for a CPE in the next few months  Check CK to ensure no problem with rhabdo as she is on statin  Signed Lamar Blinks, MD

## 2015-05-16 ENCOUNTER — Encounter: Payer: Self-pay | Admitting: Family Medicine

## 2015-05-16 LAB — HEMOGLOBIN A1C
HEMOGLOBIN A1C: 6.2 % — AB (ref <5.7)
Mean Plasma Glucose: 131 mg/dL — ABNORMAL HIGH (ref <117)

## 2016-01-21 ENCOUNTER — Telehealth: Payer: Self-pay

## 2016-01-21 DIAGNOSIS — I10 Essential (primary) hypertension: Secondary | ICD-10-CM

## 2016-01-21 NOTE — Telephone Encounter (Signed)
Pt is needing a refill on lisinopril-she states that the pharmacy has been sending a request over and we have been denying it but i do not see any record of the request  Best [phone]

## 2016-01-25 MED ORDER — LISINOPRIL 5 MG PO TABS: 5.0000 mg | ORAL_TABLET | Freq: Every day | ORAL | Status: DC

## 2016-01-25 NOTE — Telephone Encounter (Signed)
Rx sent 

## 2016-01-29 ENCOUNTER — Other Ambulatory Visit: Payer: Self-pay | Admitting: Family Medicine

## 2016-02-01 ENCOUNTER — Other Ambulatory Visit: Payer: Self-pay | Admitting: Family Medicine

## 2016-02-02 ENCOUNTER — Other Ambulatory Visit: Payer: Self-pay

## 2016-02-02 DIAGNOSIS — J4520 Mild intermittent asthma, uncomplicated: Secondary | ICD-10-CM

## 2016-02-02 MED ORDER — ALBUTEROL SULFATE HFA 108 (90 BASE) MCG/ACT IN AERS: 2.0000 | INHALATION_SPRAY | RESPIRATORY_TRACT | Status: DC | PRN

## 2016-05-13 ENCOUNTER — Other Ambulatory Visit: Payer: Self-pay

## 2016-05-13 DIAGNOSIS — E785 Hyperlipidemia, unspecified: Secondary | ICD-10-CM

## 2016-05-13 MED ORDER — SIMVASTATIN 10 MG PO TABS: 10.0000 mg | ORAL_TABLET | Freq: Every day | ORAL | 0 refills | Status: DC

## 2016-05-26 ENCOUNTER — Other Ambulatory Visit: Payer: Self-pay | Admitting: Urgent Care

## 2016-05-26 DIAGNOSIS — I10 Essential (primary) hypertension: Secondary | ICD-10-CM

## 2016-07-16 ENCOUNTER — Ambulatory Visit: Payer: Managed Care, Other (non HMO)

## 2016-07-21 ENCOUNTER — Other Ambulatory Visit: Payer: Self-pay | Admitting: Physician Assistant

## 2016-07-21 DIAGNOSIS — I10 Essential (primary) hypertension: Secondary | ICD-10-CM

## 2016-07-21 NOTE — Telephone Encounter (Signed)
05/2015 last ov and labs. Has an appt. For 07/16/16 and she cancelled it.. Daughter sameera contacted and left message mom need ov to get med refilled. Call asap [phone] for appointment.

## 2016-07-22 NOTE — Telephone Encounter (Signed)
Tried to call to schedule appointment. She stated that her daughter was handling it.

## 2016-07-27 ENCOUNTER — Ambulatory Visit (INDEPENDENT_AMBULATORY_CARE_PROVIDER_SITE_OTHER): Payer: Managed Care, Other (non HMO)

## 2016-07-27 ENCOUNTER — Ambulatory Visit (INDEPENDENT_AMBULATORY_CARE_PROVIDER_SITE_OTHER): Payer: Managed Care, Other (non HMO) | Admitting: Physician Assistant

## 2016-07-27 VITALS — BP 130/80 | HR 79 | Temp 98.5°F | Resp 16 | Ht 62.5 in | Wt 249.0 lb

## 2016-07-27 DIAGNOSIS — J4520 Mild intermittent asthma, uncomplicated: Secondary | ICD-10-CM

## 2016-07-27 DIAGNOSIS — Z760 Encounter for issue of repeat prescription: Secondary | ICD-10-CM | POA: Diagnosis not present

## 2016-07-27 DIAGNOSIS — R059 Cough, unspecified: Secondary | ICD-10-CM

## 2016-07-27 DIAGNOSIS — R05 Cough: Secondary | ICD-10-CM

## 2016-07-27 DIAGNOSIS — I10 Essential (primary) hypertension: Secondary | ICD-10-CM

## 2016-07-27 DIAGNOSIS — J40 Bronchitis, not specified as acute or chronic: Secondary | ICD-10-CM | POA: Diagnosis not present

## 2016-07-27 DIAGNOSIS — E785 Hyperlipidemia, unspecified: Secondary | ICD-10-CM | POA: Diagnosis not present

## 2016-07-27 LAB — POCT CBC
Granulocyte percent: 65.7 % (ref 37–80)
HCT, POC: 38.5 % (ref 37.7–47.9)
Hemoglobin: 13.4 g/dL (ref 12.2–16.2)
Lymph, poc: 2.3 (ref 0.6–3.4)
MCH, POC: 25.7 pg — AB (ref 27–31.2)
MCHC: 34.7 g/dL (ref 31.8–35.4)
MCV: 74.3 fL — AB (ref 80–97)
MID (cbc): 0.7 (ref 0–0.9)
MPV: 9.3 fL (ref 0–99.8)
POC Granulocyte: 5.8 (ref 2–6.9)
POC LYMPH PERCENT: 26.5 % (ref 10–50)
POC MID %: 7.8 %M (ref 0–12)
Platelet Count, POC: 315 10*3/uL (ref 142–424)
RBC: 5.19 M/uL (ref 4.04–5.48)
RDW, POC: 14 %
WBC: 8.8 10*3/uL (ref 4.6–10.2)

## 2016-07-27 MED ORDER — SIMVASTATIN 10 MG PO TABS: 10.0000 mg | ORAL_TABLET | Freq: Every day | ORAL | 3 refills | Status: DC

## 2016-07-27 MED ORDER — ATENOLOL-CHLORTHALIDONE 100-25 MG PO TABS: 0.5000 | ORAL_TABLET | Freq: Every day | ORAL | 2 refills | Status: DC

## 2016-07-27 MED ORDER — AZITHROMYCIN 250 MG PO TABS: ORAL_TABLET | ORAL | 0 refills | Status: DC

## 2016-07-27 MED ORDER — LISINOPRIL 5 MG PO TABS: 5.0000 mg | ORAL_TABLET | Freq: Every day | ORAL | 1 refills | Status: DC

## 2016-07-27 MED ORDER — ALBUTEROL SULFATE HFA 108 (90 BASE) MCG/ACT IN AERS: 2.0000 | INHALATION_SPRAY | RESPIRATORY_TRACT | 0 refills | Status: DC | PRN

## 2016-07-27 MED ORDER — HYDROCODONE-HOMATROPINE 5-1.5 MG/5ML PO SYRP: 5.0000 mL | ORAL_SOLUTION | Freq: Two times a day (BID) | ORAL | 0 refills | Status: DC | PRN

## 2016-07-27 NOTE — Patient Instructions (Addendum)
Please schedule an annual appointment in the next three months. You are due for your PAP, mammogram, colonoscopy and routine lab work.  Please see the below information regarding cholesterol. At your last visit, your lipids were elevated. I will contact you with the results of your labs. We may need to increase your current dose of cholesterol medication.  Please work on getting regular exercise, about 20 minutes of walking a day and eating a heart healthy diet.    Fat and Cholesterol Restricted Diet Getting too much fat and cholesterol in your diet may cause health problems. Following this diet helps keep your fat and cholesterol at normal levels. This can keep you from getting sick. WHAT TYPES OF FAT SHOULD I CHOOSE?  Choose monosaturated and polyunsaturated fats. These are found in foods such as olive oil, canola oil, flaxseeds, walnuts, almonds, and seeds.  Eat more omega-3 fats. Good choices include salmon, mackerel, sardines, tuna, flaxseed oil, and ground flaxseeds.  Limit saturated fats. These are in animal products such as meats, butter, and cream. They can also be in plant products such as palm oil, palm kernel oil, and coconut oil.   Avoid foods with partially hydrogenated oils in them. These contain trans fats. Examples of foods that have trans fats are stick margarine, some tub margarines, cookies, crackers, and other baked goods. WHAT GENERAL GUIDELINES DO I NEED TO FOLLOW?   Check food labels. Look for the words "trans fat" and "saturated fat."  When preparing a meal:  Fill half of your plate with vegetables and green salads.  Fill one fourth of your plate with whole grains. Look for the word "whole" as the first word in the ingredient list.  Fill one fourth of your plate with lean protein foods.  Limit fruit to two servings a day. Choose fruit instead of juice.  Eat more foods with soluble fiber. Examples of foods with this type of fiber are apples, broccoli, carrots,  beans, peas, and barley. Try to get 20-30 g (grams) of fiber per day.  Eat more home-cooked foods. Eat less at restaurants and buffets.  Limit or avoid alcohol.  Limit foods high in starch and sugar.  Limit fried foods.  Cook foods without frying them. Baking, boiling, grilling, and broiling are all great options.  Lose weight if you are overweight. Losing even a small amount of weight can help your overall health. It can also help prevent diseases such as diabetes and heart disease. WHAT FOODS CAN I EAT? Grains Whole grains, such as whole wheat or whole grain breads, crackers, cereals, and pasta. Unsweetened oatmeal, bulgur, barley, quinoa, or brown rice. Corn or whole wheat flour tortillas. Vegetables Fresh or frozen vegetables (raw, steamed, roasted, or grilled). Green salads. Fruits All fresh, canned (in natural juice), or frozen fruits. Meat and Other Protein Products Ground beef (85% or leaner), grass-fed beef, or beef trimmed of fat. Skinless chicken or [place]. Ground chicken or [place]. Pork trimmed of fat. All fish and seafood. Eggs. Dried beans, peas, or lentils. Unsalted nuts or seeds. Unsalted canned or dry beans. Dairy Low-fat dairy products, such as skim or 1% milk, 2% or reduced-fat cheeses, low-fat ricotta or cottage cheese, or plain low-fat yogurt. Fats and Oils Tub margarines without trans fats. Light or reduced-fat mayonnaise and salad dressings. Avocado. Olive, canola, sesame, or safflower oils. Natural peanut or almond butter (choose ones without added sugar and oil). The items listed above may not be a complete list of recommended foods or beverages. Contact  your dietitian for more options. WHAT FOODS ARE NOT RECOMMENDED? Grains White bread. White pasta. White rice. Cornbread. Bagels, pastries, and croissants. Crackers that contain trans fat. Vegetables White potatoes. Corn. Creamed or fried vegetables. Vegetables in a cheese sauce. Fruits Dried fruits. Canned  fruit in light or heavy syrup. Fruit juice. Meat and Other Protein Products Fatty cuts of meat. Ribs, chicken wings, bacon, sausage, bologna, salami, chitterlings, fatback, hot dogs, bratwurst, and packaged luncheon meats. Liver and organ meats. Dairy Whole or 2% milk, cream, half-and-half, and cream cheese. Whole milk cheeses. Whole-fat or sweetened yogurt. Full-fat cheeses. Nondairy creamers and whipped toppings. Processed cheese, cheese spreads, or cheese curds. Sweets and Desserts Corn syrup, sugars, honey, and molasses. Candy. Jam and jelly. Syrup. Sweetened cereals. Cookies, pies, cakes, donuts, muffins, and ice cream. Fats and Oils Butter, stick margarine, lard, shortening, ghee, or bacon fat. Coconut, palm kernel, or palm oils. Beverages Alcohol. Sweetened drinks (such as sodas, lemonade, and fruit drinks or punches). The items listed above may not be a complete list of foods and beverages to avoid. Contact your dietitian for more information.   This information is not intended to replace advice given to you by your health care provider. Make sure you discuss any questions you have with your health care provider.   Document Released: 03/27/2012 Document Revised: 10/17/2014 Document Reviewed: 12/26/2013 Elsevier Interactive Patient Education 2016 [employer].    IF you received an x-ray today, you will receive an invoice from [facility]. Please contact [facility] at [phone] with questions or concerns regarding your invoice.   IF you received labwork today, you will receive an invoice from [employer]. Please contact [facility] at [phone] with questions or concerns regarding your invoice.   Our billing staff will not be able to assist you with questions regarding bills from these companies.  You will be contacted with the lab results as soon as they are available. The fastest way to get your results is to activate your My Chart  account. Instructions are located on the last page of this paperwork. If you have not heard from [place] regarding the results in 2 weeks, please contact this office.     We recommend that you schedule a mammogram for breast cancer screening. Typically, you do not need a referral to do this. Please contact a local imaging center to schedule your mammogram.  [facility] - [phone]  *ask for the Radiology Department [facility] ([place]) - [phone] or [phone]  [facility] - [phone] [place] [phone] [facility] - [phone]  *ask for the [facility] - [phone]  *ask for the Radiology Department [facility] - [phone]  *ask for the [place] - [phone]

## 2016-07-27 NOTE — Progress Notes (Signed)
Veronica Velazquez  MRN: [id number] DOB: 1966-08-21  PCP: No PCP Per Patient  Subjective:  Pt is a 50 year old female, history of hypertension, asthma, hyperlipidemia, who presents to clinic for medication refill and cough. Her primary language is Urdu. She is here today with her husband.  Hypertension - Well controlled with Tenoretic 100-25 mg, lisinopril 5 mg. Blood pressure today is 130/80. Reports no side effects from medication. Denies chest pain, headache, lightheadedness, palpitations.   Hyperlipidemia - Simvastatin 10mg . Last labs were drawn in 2016 and were elevated   Asthma - Controlled with albuterol and hydroxyzine 10mg . Only uses it when she is sick, which is every few months.   Cough x 2 weeks. Non-productive. Describes is as "dry". Notes associated SOB. Has gotten worse over the course of two weeks. Denies fever, chills, chest pain, headache. Has not taken anything for it. She would like cough medication.   Review of Systems  Constitutional: Negative for chills, fatigue and fever.  HENT: Positive for sore throat. Negative for congestion, rhinorrhea, sinus pressure and sneezing.   Respiratory: Positive for cough, chest tightness and shortness of breath.   Cardiovascular: Negative for chest pain and palpitations.  Gastrointestinal: Negative for abdominal pain, diarrhea, nausea and vomiting.  Neurological: Negative for dizziness, weakness, light-headedness and headaches.    Patient Active Problem List   Diagnosis Date Noted  . Asthma 09/12/2012  . HTN (hypertension) 09/12/2012  . Hypothyroidism   . Depression   . Obesity   . Microcytosis     Current Outpatient Prescriptions on File Prior to Visit  Medication Sig Dispense Refill  . albuterol (PROVENTIL HFA;VENTOLIN HFA) 108 (90 Base) MCG/ACT inhaler Inhale 2 puffs into the lungs every 4 (four) hours as needed for wheezing (cough, shortness of breath or wheezing.). 1 Inhaler 0  . atenolol-chlorthalidone (TENORETIC)  100-25 MG tablet TAKE 1/2 TABLET BY MOUTH EVERY DAY 45 tablet 0  . hydrOXYzine (ATARAX/VISTARIL) 10 MG tablet Take 1 tablet (10 mg total) by mouth 3 (three) times daily as needed. For anxiety. May make you sleepy. 30 tablet 2  . lisinopril (PRINIVIL,ZESTRIL) 5 MG tablet Take 1 tablet (5 mg total) by mouth daily. 90 tablet 1  . simvastatin (ZOCOR) 10 MG tablet Take 1 tablet (10 mg total) by mouth daily. 30 tablet 0   No current facility-administered medications on file prior to visit.     No Known Allergies  Objective:  BP 130/80 (BP Location: Right Arm, Patient Position: Sitting, Cuff Size: Large)   Pulse 79   Temp 98.5 F (36.9 C) (Oral)   Resp 16   Ht 5' 2.5" (1.588 m)   Wt 249 lb (112.9 kg)   SpO2 97%   BMI 44.82 kg/m   Physical Exam  Constitutional: She is oriented to person, place, and time and well-developed, well-nourished, and in no distress. No distress.  Cardiovascular: Normal rate, regular rhythm and normal heart sounds.   Pulmonary/Chest: Effort normal. No respiratory distress. She has wheezes in the right lower field, the left upper field and the left middle field. She has rhonchi in the left middle field.  Neurological: She is alert and oriented to person, place, and time. GCS score is 15.  Skin: Skin is warm and dry.  Psychiatric: Mood, memory, affect and judgment normal.  Vitals reviewed.  Results for orders placed or performed in visit on 07/27/16  POCT CBC  Result Value Ref Range   WBC 8.8 4.6 - 10.2 K/uL   Lymph, poc  2.3 0.6 - 3.4   POC LYMPH PERCENT 26.5 10 - 50 %L   MID (cbc) 0.7 0 - 0.9   POC MID % 7.8 0 - 12 %M   POC Granulocyte 5.8 2 - 6.9   Granulocyte percent 65.7 37 - 80 %G   RBC 5.19 4.04 - 5.48 M/uL   Hemoglobin 13.4 12.2 - 16.2 g/dL   HCT, POC 38.5 37.7 - 47.9 %   MCV 74.3 (A) 80 - 97 fL   MCH, POC 25.7 (A) 27 - 31.2 pg   MCHC 34.7 31.8 - 35.4 g/dL   RDW, POC 14.0 %   Platelet Count, POC 315 142 - 424 K/uL   MPV 9.3 0 - 99.8 fL   Dg  Chest 2 View  Result Date: 07/27/2016 CLINICAL DATA:  Subacute onset of cough.  Initial encounter. EXAM: CHEST  2 VIEW COMPARISON:  Chest radiograph performed 04/09/2013 FINDINGS: The lungs are well-aerated and clear. There is no evidence of focal opacification, pleural effusion or pneumothorax. The heart is borderline normal in size. No acute osseous abnormalities are seen. IMPRESSION: No acute cardiopulmonary process seen. Electronically Signed   By: Garald Balding M.D.   On: 07/27/2016 18:20    Assessment and Plan :  1. Medication refill 2. Hyperlipidemia, unspecified hyperlipidemia type - Lipid panel - Labs pending. Dicussed with patient importance of controlling lipid levels. Will assess need for medication dose change. Will refill medication at this time. Encouraged her to eat a heart healthy diet low in saturated fats. Information printed out for patient.  Encouraged patient to schedule an annual check-up as she is overdue for health maintenance.  - simvastatin (ZOCOR) 10 MG tablet; Take 1 tablet (10 mg total) by mouth daily.  Dispense: 30 tablet; Refill: 3  3. Essential hypertension - Well controlled, will refill medications.  - COMPLETE METABOLIC PANEL WITH GFR - lisinopril (PRINIVIL,ZESTRIL) 5 MG tablet; Take 1 tablet (5 mg total) by mouth daily.  Dispense: 90 tablet; Refill: 1 - atenolol-chlorthalidone (TENORETIC) 100-25 MG tablet; Take 0.5 tablets by mouth daily.  Dispense: 45 tablet; Refill: 2  4. Mild intermittent asthma without complication - albuterol (PROVENTIL HFA;VENTOLIN HFA) 108 (90 Base) MCG/ACT inhaler; Inhale 2 puffs into the lungs every 4 (four) hours as needed for wheezing (cough, shortness of breath or wheezing.).  Dispense: 1 Inhaler; Refill: 0  5. Cough 6. Bronchitis - POCT CBC - DG Chest 2 View; Future - HYDROcodone-homatropine (HYCODAN) 5-1.5 MG/5ML syrup; Take 5 mLs by mouth every 12 (twelve) hours as needed for cough.  Dispense: 100 mL; Refill: 0 -  azithromycin (ZITHROMAX) 250 MG tablet; Take 2 tabs PO x 1 dose, then 1 tab PO QD x 4 days  Dispense: 6 tablet; Refill: 0    Mercer Pod, PA-C  [facility] 07/27/2016 5:21 PM

## 2016-07-28 LAB — COMPLETE METABOLIC PANEL WITHOUT GFR
Albumin: 3.8 g/dL (ref 3.6–5.1)
BUN: 12 mg/dL (ref 7–25)
Calcium: 9.1 mg/dL (ref 8.6–10.4)
Creat: 0.61 mg/dL (ref 0.50–1.05)
GFR, Est Non African American: >89 mL/min (ref >=60)
Potassium: 4 mmol/L (ref 3.5–5.3)
Sodium: 138 mmol/L (ref 135–146)
Total Bilirubin: 0.3 mg/dL (ref 0.2–1.2)

## 2016-07-28 LAB — LIPID PANEL
Cholesterol: 232 mg/dL — ABNORMAL HIGH (ref 125–200)
HDL: 50 mg/dL (ref >=46)
LDL Cholesterol: 150 mg/dL — ABNORMAL HIGH (ref <130)
Total CHOL/HDL Ratio: 4.6 ratio (ref <=5.0)
Triglycerides: 159 mg/dL — ABNORMAL HIGH (ref <150)
VLDL: 32 mg/dL — ABNORMAL HIGH (ref <30)

## 2016-07-28 LAB — COMPLETE METABOLIC PANEL WITH GFR
ALT: 10 U/L (ref 6–29)
AST: 12 U/L (ref 10–35)
Alkaline Phosphatase: 100 U/L (ref 33–130)
CO2: 27 mmol/L (ref 20–31)
Chloride: 102 mmol/L (ref 98–110)
GFR, Est African American: >89 mL/min (ref >=60)
Glucose, Bld: 185 mg/dL — ABNORMAL HIGH (ref 65–99)
Total Protein: 6.8 g/dL (ref 6.1–8.1)

## 2016-07-31 ENCOUNTER — Encounter: Payer: Self-pay | Admitting: Physician Assistant

## 2016-07-31 MED ORDER — SIMVASTATIN 20 MG PO TABS: 20.0000 mg | ORAL_TABLET | Freq: Every day | ORAL | 1 refills | Status: DC

## 2016-07-31 NOTE — Progress Notes (Signed)
Please call patient and let her know her lipid levels are elevated. She is to start taking 20mg  of Simvistatin... Which is 2 pills (she currently has a 10mg  prescription). I sent in a 20mg  Simvistatin to her pharmacy. Once she fills this prescription, make sure she takes one pill/day. Thank you!

## 2016-08-01 ENCOUNTER — Telehealth: Payer: Self-pay

## 2016-08-01 NOTE — Telephone Encounter (Signed)
Pharmacy confirming simvastatin increased to 20mg  with pharmacy. See lab result note

## 2016-08-05 ENCOUNTER — Telehealth: Payer: Self-pay | Admitting: Emergency Medicine

## 2016-08-05 NOTE — Telephone Encounter (Signed)
-----   Message from [facility], PA-C sent at 07/31/2016  1:56 PM EDT ----- Please call patient and let her know her lipid levels are elevated. She is to start taking 20mg  of Simvistatin... Which is 2 pills (she currently has a 10mg  prescription). I sent in a 20mg  Simvistatin to her pharmacy. Once she fills this prescription , make sure she takes one pill/day. Thank you!

## 2016-12-16 ENCOUNTER — Ambulatory Visit (INDEPENDENT_AMBULATORY_CARE_PROVIDER_SITE_OTHER): Payer: Managed Care, Other (non HMO) | Admitting: Urgent Care

## 2016-12-16 ENCOUNTER — Ambulatory Visit (INDEPENDENT_AMBULATORY_CARE_PROVIDER_SITE_OTHER): Payer: Managed Care, Other (non HMO)

## 2016-12-16 ENCOUNTER — Encounter: Payer: Self-pay | Admitting: Urgent Care

## 2016-12-16 VITALS — BP 164/100 | HR 103 | Temp 98.6°F | Resp 18 | Ht 62.5 in | Wt 245.6 lb

## 2016-12-16 DIAGNOSIS — R05 Cough: Secondary | ICD-10-CM

## 2016-12-16 DIAGNOSIS — I10 Essential (primary) hypertension: Secondary | ICD-10-CM | POA: Diagnosis not present

## 2016-12-16 DIAGNOSIS — R030 Elevated blood-pressure reading, without diagnosis of hypertension: Secondary | ICD-10-CM

## 2016-12-16 DIAGNOSIS — R062 Wheezing: Secondary | ICD-10-CM | POA: Diagnosis not present

## 2016-12-16 DIAGNOSIS — R0602 Shortness of breath: Secondary | ICD-10-CM

## 2016-12-16 DIAGNOSIS — J4530 Mild persistent asthma, uncomplicated: Secondary | ICD-10-CM

## 2016-12-16 DIAGNOSIS — R059 Cough, unspecified: Secondary | ICD-10-CM

## 2016-12-16 LAB — POCT CBC
Granulocyte percent: 85.4 %G — AB (ref 37–80)
HCT, POC: 39.5 % (ref 37.7–47.9)
HEMOGLOBIN: 13.5 g/dL (ref 12.2–16.2)
LYMPH, POC: 1 (ref 0.6–3.4)
MCH, POC: 25.6 pg — AB (ref 27–31.2)
MCHC: 34.3 g/dL (ref 31.8–35.4)
MCV: 74.6 fL — AB (ref 80–97)
MID (cbc): 0.6 (ref 0–0.9)
MPV: 8.6 fL (ref 0–99.8)
PLATELET COUNT, POC: 331 10*3/uL (ref 142–424)
POC GRANULOCYTE: 8.9 — AB (ref 2–6.9)
POC LYMPH %: 9.2 % — AB (ref 10–50)
POC MID %: 5.4 %M (ref 0–12)
RBC: 5.29 M/uL (ref 4.04–5.48)
RDW, POC: 14.8 %
WBC: 10.4 10*3/uL — AB (ref 4.6–10.2)

## 2016-12-16 LAB — GLUCOSE, POCT (MANUAL RESULT ENTRY): POC GLUCOSE: 106 mg/dL — AB (ref 70–99)

## 2016-12-16 MED ORDER — HYDROCODONE-HOMATROPINE 5-1.5 MG/5ML PO SYRP: 5.0000 mL | ORAL_SOLUTION | Freq: Every evening | ORAL | 0 refills | Status: DC | PRN

## 2016-12-16 MED ORDER — BENZONATATE 100 MG PO CAPS: 100.0000 mg | ORAL_CAPSULE | Freq: Three times a day (TID) | ORAL | 0 refills | Status: DC | PRN

## 2016-12-16 MED ORDER — IPRATROPIUM BROMIDE 0.02 % IN SOLN
0.5000 mg | Freq: Once | RESPIRATORY_TRACT | Status: AC
  Administered 2016-12-16: 0.5 mg via RESPIRATORY_TRACT

## 2016-12-16 MED ORDER — LEVALBUTEROL HCL 1.25 MG/0.5ML IN NEBU
1.2500 mg | INHALATION_SOLUTION | Freq: Once | RESPIRATORY_TRACT | Status: AC
  Administered 2016-12-16: 1.25 mg via RESPIRATORY_TRACT

## 2016-12-16 NOTE — Progress Notes (Signed)
MRN: [id number] DOB: 1966/01/18  Subjective:   Veronica Velazquez is a 51 y.o. female presenting for chief complaint of Asthma (short of breath since yesterday )  Reports sudden onset last night of shob, wheezing. Patient used her albuterol inhaler 4 times without relief. Has a history of asthma, uses albuterol seldomly. Denies fever, sore throat, chest pain, n/v, abdominal pain, diaphoresis, lower leg swelling. She did not take her BP medication this morning. Denies smoking cigarettes.   Veronica Velazquez has a current medication list which includes the following prescription(s): albuterol, atenolol-chlorthalidone, hydroxyzine, lisinopril, and simvastatin. Also has No Known Allergies.  Veronica Velazquez  has a past medical history of Asthma; Depression; Hyperglycemia; Hypertension; Hypothyroidism; Microcytosis; and Obesity. Also  has a past surgical history that includes Abdominal hysterectomy.  Objective:   Vitals: BP (!) 164/100 (BP Location: Left Arm, Patient Position: Sitting, Cuff Size: Large)   Pulse (!) 103   Temp 98.6 F (37 C) (Oral)   Resp 18   Ht 5' 2.5" (1.588 m)   Wt 245 lb 9.6 oz (111.4 kg)   SpO2 95%   BMI 44.20 kg/m   Physical Exam  Constitutional: She is oriented to person, place, and time. She appears well-developed and well-nourished.  HENT:  Mouth/Throat: Oropharynx is clear and moist.  Eyes: No scleral icterus.  Cardiovascular: Normal rate, regular rhythm and intact distal pulses.  Exam reveals no gallop and no friction rub.   No murmur heard. Pulmonary/Chest: No respiratory distress. She has wheezes (diffuse wheezing). She has no rales.  Neurological: She is alert and oriented to person, place, and time.  Skin: Skin is warm and dry.   ECG interpretation -   Dg Chest 2 View  Result Date: 12/16/2016 CLINICAL DATA:  Cough for the past month. Shortness of breath and wheezing. EXAM: CHEST  2 VIEW COMPARISON:  07/27/2016; 04/09/2013 FINDINGS: Grossly unchanged cardiac silhouette  and mediastinal contours. Mild tortuosity of the thoracic aorta. No focal airspace opacities. No pleural effusion or pneumothorax. No evidence of edema. No acute osseus abnormalities. IMPRESSION: No acute cardiopulmonary disease. Specifically, no evidence of pneumonia. Electronically Signed   By: Sandi Mariscal M.D.   On: 12/16/2016 11:07   Results for orders placed or performed in visit on 12/16/16 (from the past 24 hour(s))  POCT CBC     Status: Abnormal   Collection Time: 12/16/16 11:01 AM  Result Value Ref Range   WBC 10.4 (A) 4.6 - 10.2 K/uL   Lymph, poc 1.0 0.6 - 3.4   POC LYMPH PERCENT 9.2 (A) 10 - 50 %L   MID (cbc) 0.6 0 - 0.9   POC MID % 5.4 0 - 12 %M   POC Granulocyte 8.9 (A) 2 - 6.9   Granulocyte percent 85.4 (A) 37 - 80 %G   RBC 5.29 4.04 - 5.48 M/uL   Hemoglobin 13.5 12.2 - 16.2 g/dL   HCT, POC 39.5 37.7 - 47.9 %   MCV 74.6 (A) 80 - 97 fL   MCH, POC 25.6 (A) 27 - 31.2 pg   MCHC 34.3 31.8 - 35.4 g/dL   RDW, POC 14.8 %   Platelet Count, POC 331 142 - 424 K/uL   MPV 8.6 0 - 99.8 fL  POCT glucose (manual entry)     Status: Abnormal   Collection Time: 12/16/16 11:25 AM  Result Value Ref Range   POC Glucose 106 (A) 70 - 99 mg/dl   Assessment and Plan :   1. Mild persistent extrinsic asthma without complication  2. Wheezing 3. Shortness of breath 4. Cough - Labs pending, will manage patient conservatively for asthma attack. Schedule albuterol inhaler every 4-6 hours. Use cough suppression medications. RTC in 3 days if no improvement.  5. Essential hypertension 6. Elevated blood pressure reading - Recheck BP in 2 weeks. Emphasized compliance with BP medications.  7. Morbid obesity ([place]) - Obtained glucose in case patient needs short steroid course.  Veronica Eagles, PA-C [facility] [phone] 12/16/2016  10:08 AM

## 2016-12-16 NOTE — Patient Instructions (Addendum)
Asthma, Adult Asthma is a recurring condition in which the airways tighten and narrow. Asthma can make it difficult to breathe. It can cause coughing, wheezing, and shortness of breath. Asthma episodes, also called asthma attacks, range from minor to life-threatening. Asthma cannot be cured, but medicines and lifestyle changes can help control it. What are the causes? Asthma is believed to be caused by inherited (genetic) and environmental factors, but its exact cause is unknown. Asthma may be triggered by allergens, lung infections, or irritants in the air. Asthma triggers are different for each person. Common triggers include:  Animal dander.  Dust mites.  Cockroaches.  Pollen from trees or grass.  Mold.  Smoke.  Air pollutants such as dust, household cleaners, hair sprays, aerosol sprays, paint fumes, strong chemicals, or strong odors.  Cold air, weather changes, and winds (which increase molds and pollens in the air).  Strong emotional expressions such as crying or laughing hard.  Stress.  Certain medicines (such as aspirin) or types of drugs (such as beta-blockers).  Sulfites in foods and drinks. Foods and drinks that may contain sulfites include dried fruit, potato chips, and sparkling grape juice.  Infections or inflammatory conditions such as the flu, a cold, or an inflammation of the nasal membranes (rhinitis).  Gastroesophageal reflux disease (GERD).  Exercise or strenuous activity.  What are the signs or symptoms? Symptoms may occur immediately after asthma is triggered or many hours later. Symptoms include:  Wheezing.  Excessive nighttime or early morning coughing.  Frequent or severe coughing with a common cold.  Chest tightness.  Shortness of breath.  How is this diagnosed? The diagnosis of asthma is made by a review of your medical history and a physical exam. Tests may also be performed. These may include:  Lung function studies. These tests show how  much air you breathe in and out.  Allergy tests.  Imaging tests such as X-rays.  How is this treated? Asthma cannot be cured, but it can usually be controlled. Treatment involves identifying and avoiding your asthma triggers. It also involves medicines. There are 2 classes of medicine used for asthma treatment:  Controller medicines. These prevent asthma symptoms from occurring. They are usually taken every day.  Reliever or rescue medicines. These quickly relieve asthma symptoms. They are used as needed and provide short-term relief.  Your health care provider will help you create an asthma action plan. An asthma action plan is a written plan for managing and treating your asthma attacks. It includes a list of your asthma triggers and how they may be avoided. It also includes information on when medicines should be taken and when their dosage should be changed. An action plan may also involve the use of a device called a peak flow meter. A peak flow meter measures how well the lungs are working. It helps you monitor your condition. Follow these instructions at home:  Take medicines only as directed by your health care provider. Speak with your health care provider if you have questions about how or when to take the medicines.  Use a peak flow meter as directed by your health care provider. Record and keep track of readings.  Understand and use the action plan to help minimize or stop an asthma attack without needing to seek medical care.  Control your home environment in the following ways to help prevent asthma attacks: ? Do not smoke. Avoid being exposed to secondhand smoke. ? Change your heating and air conditioning filter regularly. ? Limit   your use of fireplaces and wood stoves. ? Get rid of pests (such as roaches and mice) and their droppings. ? Throw away plants if you see mold on them. ? Clean your floors and dust regularly. Use unscented cleaning products. ? Try to have someone  else vacuum for you regularly. Stay out of rooms while they are being vacuumed and for a short while afterward. If you vacuum, use a dust mask from a hardware store, a double-layered or microfilter vacuum cleaner bag, or a vacuum cleaner with a HEPA filter. ? Replace carpet with wood, tile, or vinyl flooring. Carpet can trap dander and dust. ? Use allergy-proof pillows, mattress covers, and box spring covers. ? Wash bed sheets and blankets every week in hot water and dry them in a dryer. ? Use blankets that are made of polyester or cotton. ? Clean bathrooms and kitchens with bleach. If possible, have someone repaint the walls in these rooms with mold-resistant paint. Keep out of the rooms that are being cleaned and painted. ? Wash hands frequently. Contact a health care provider if:  You have wheezing, shortness of breath, or a cough even if taking medicine to prevent attacks.  The colored mucus you cough up (sputum) is thicker than usual.  Your sputum changes from clear or white to yellow, green, gray, or bloody.  You have any problems that may be related to the medicines you are taking (such as a rash, itching, swelling, or trouble breathing).  You are using a reliever medicine more than 2-3 times per week.  Your peak flow is still at 50-79% of your personal best after following your action plan for 1 hour.  You have a fever. Get help right away if:  You seem to be getting worse and are unresponsive to treatment during an asthma attack.  You are short of breath even at rest.  You get short of breath when doing very little physical activity.  You have difficulty eating, drinking, or talking due to asthma symptoms.  You develop chest pain.  You develop a fast heartbeat.  You have a bluish color to your lips or fingernails.  You are light-headed, dizzy, or faint.  Your peak flow is less than 50% of your personal best. This information is not intended to replace advice given to  you by your health care provider. Make sure you discuss any questions you have with your health care provider. Document Released: 09/26/2005 Document Revised: 03/09/2016 Document Reviewed: 04/25/2013 Elsevier Interactive Patient Education  2017 [employer]     IF you received an x-ray today, you will receive an invoice from [facility]. Please contact [facility] at [phone] with questions or concerns regarding your invoice.   IF you received labwork today, you will receive an invoice from [facility]. Please contact [facility] at [phone] with questions or concerns regarding your invoice.   Our billing staff will not be able to assist you with questions regarding bills from these companies.  You will be contacted with the lab results as soon as they are available. The fastest way to get your results is to activate your My Chart account. Instructions are located on the last page of this paperwork. If you have not heard from us regarding the results in 2 weeks, please contact this office.     

## 2016-12-19 ENCOUNTER — Ambulatory Visit: Payer: Managed Care, Other (non HMO)

## 2016-12-20 ENCOUNTER — Encounter: Payer: Self-pay | Admitting: Urgent Care

## 2016-12-20 LAB — BRAIN NATRIURETIC PEPTIDE: BNP: 79.6 pg/mL (ref 0.0–100.0)

## 2016-12-26 ENCOUNTER — Ambulatory Visit: Payer: Managed Care, Other (non HMO)

## 2017-01-29 ENCOUNTER — Other Ambulatory Visit: Payer: Self-pay | Admitting: Physician Assistant

## 2017-01-29 DIAGNOSIS — I10 Essential (primary) hypertension: Secondary | ICD-10-CM

## 2017-04-28 ENCOUNTER — Other Ambulatory Visit: Payer: Self-pay | Admitting: Physician Assistant

## 2017-04-28 DIAGNOSIS — I10 Essential (primary) hypertension: Secondary | ICD-10-CM

## 2017-05-02 ENCOUNTER — Other Ambulatory Visit: Payer: Self-pay | Admitting: Physician Assistant

## 2017-05-02 DIAGNOSIS — I10 Essential (primary) hypertension: Secondary | ICD-10-CM

## 2017-05-03 ENCOUNTER — Telehealth: Payer: Self-pay | Admitting: Family Medicine

## 2017-05-03 NOTE — Telephone Encounter (Signed)
Left message with pt daughter Arvilla Meres for pt to schedule a OV with [facility] for med refill daughter states that she will call back to schedule mothers appointment

## 2017-05-03 NOTE — Telephone Encounter (Signed)
Left message with pt daughter Veronica Velazquez for pt to schedule a OV with [facility] for med refill daughter states that she will call back to schedule mothers appointment

## 2017-05-06 ENCOUNTER — Other Ambulatory Visit: Payer: Self-pay | Admitting: Physician Assistant

## 2017-05-06 DIAGNOSIS — I10 Essential (primary) hypertension: Secondary | ICD-10-CM

## 2017-06-14 ENCOUNTER — Other Ambulatory Visit: Payer: Self-pay | Admitting: Physician Assistant

## 2017-06-14 DIAGNOSIS — I10 Essential (primary) hypertension: Secondary | ICD-10-CM

## 2017-07-25 ENCOUNTER — Other Ambulatory Visit: Payer: Self-pay | Admitting: Physician Assistant

## 2017-07-25 DIAGNOSIS — J4520 Mild intermittent asthma, uncomplicated: Secondary | ICD-10-CM

## 2017-07-26 ENCOUNTER — Other Ambulatory Visit: Payer: Self-pay | Admitting: Physician Assistant

## 2017-07-26 DIAGNOSIS — J4520 Mild intermittent asthma, uncomplicated: Secondary | ICD-10-CM

## 2017-07-26 MED ORDER — ALBUTEROL SULFATE HFA 108 (90 BASE) MCG/ACT IN AERS: 2.0000 | INHALATION_SPRAY | RESPIRATORY_TRACT | 0 refills | Status: DC | PRN

## 2017-07-28 ENCOUNTER — Ambulatory Visit (INDEPENDENT_AMBULATORY_CARE_PROVIDER_SITE_OTHER): Payer: 59

## 2017-07-28 ENCOUNTER — Encounter: Payer: Self-pay | Admitting: Physician Assistant

## 2017-07-28 ENCOUNTER — Ambulatory Visit (INDEPENDENT_AMBULATORY_CARE_PROVIDER_SITE_OTHER): Payer: 59 | Admitting: Physician Assistant

## 2017-07-28 VITALS — BP 130/84 | HR 69 | Resp 16 | Ht 62.5 in | Wt 248.8 lb

## 2017-07-28 DIAGNOSIS — J4521 Mild intermittent asthma with (acute) exacerbation: Secondary | ICD-10-CM | POA: Diagnosis not present

## 2017-07-28 DIAGNOSIS — R062 Wheezing: Secondary | ICD-10-CM

## 2017-07-28 DIAGNOSIS — Z131 Encounter for screening for diabetes mellitus: Secondary | ICD-10-CM

## 2017-07-28 LAB — POCT GLYCOSYLATED HEMOGLOBIN (HGB A1C): Hemoglobin A1C: 6.7

## 2017-07-28 MED ORDER — IPRATROPIUM BROMIDE 0.02 % IN SOLN
0.5000 mg | Freq: Once | RESPIRATORY_TRACT | Status: AC
  Administered 2017-07-28: 0.5 mg via RESPIRATORY_TRACT

## 2017-07-28 MED ORDER — PREDNISONE 50 MG PO TABS: ORAL_TABLET | ORAL | 0 refills | Status: DC

## 2017-07-28 MED ORDER — METHYLPREDNISOLONE SODIUM SUCC 125 MG IJ SOLR
125.0000 mg | Freq: Once | INTRAMUSCULAR | Status: AC
  Administered 2017-07-28: 125 mg via INTRAMUSCULAR

## 2017-07-28 MED ORDER — ALBUTEROL SULFATE (2.5 MG/3ML) 0.083% IN NEBU
2.5000 mg | INHALATION_SOLUTION | Freq: Once | RESPIRATORY_TRACT | Status: AC
  Administered 2017-07-28: 2.5 mg via RESPIRATORY_TRACT

## 2017-07-28 MED ORDER — METHYLPREDNISOLONE SODIUM SUCC 125 MG IJ SOLR
125.0000 mg | Freq: Once | INTRAMUSCULAR | Status: DC
Start: 2017-07-28 — End: 2017-07-28

## 2017-07-28 NOTE — Progress Notes (Signed)
07/29/2017 3:23 PM   DOB: Nov 21, 1965 / MRN: [id number]  SUBJECTIVE:  Veronica Velazquez is a 51 y.o. female presenting for cough that started about 10-11 days ago.  Denies fever, night sweats and chills.    She has No Known Allergies.   She  has a past medical history of Asthma; Depression; Hyperglycemia; Hypertension; Hypothyroidism; Microcytosis; and Obesity.    She  reports that she has never smoked. She has never used smokeless tobacco. She reports that she does not drink alcohol or use drugs. She  reports that she currently engages in sexual activity and has had female partners. The patient  has a past surgical history that includes Abdominal hysterectomy.  Her family history includes Diabetes in her brother.  Review of Systems  Constitutional: Negative for chills, diaphoresis and fever.  Eyes: Negative.   Respiratory: Positive for shortness of breath and wheezing. Negative for cough, hemoptysis and sputum production.   Cardiovascular: Negative for chest pain, orthopnea and leg swelling.  Gastrointestinal: Negative for nausea.  Skin: Negative for rash.  Neurological: Negative for dizziness, sensory change, speech change, focal weakness and headaches.    The problem list and medications were reviewed and updated by myself where necessary and exist elsewhere in the encounter.   OBJECTIVE:  BP 130/84 (BP Location: Right Arm, Patient Position: Sitting, Cuff Size: Large)   Pulse 69   Resp 16   Ht 5' 2.5" (1.588 m)   Wt 248 lb 12.8 oz (112.9 kg)   SpO2 94%   BMI 44.78 kg/m   Physical Exam  Constitutional: She is active.  Non-toxic appearance.  Cardiovascular: Normal rate.   Pulmonary/Chest: Effort normal. No tachypnea. No respiratory distress. She has wheezes (Resolved with nebs x 2. ). She has no rales. She exhibits no tenderness.  Neurological: She is alert.  Skin: Skin is warm and dry. She is not diaphoretic. No pallor.    Results for orders placed or performed in  visit on 07/28/17 (from the past 72 hour(s))  POCT glycosylated hemoglobin (Hb A1C)     Status: None   Collection Time: 07/28/17  5:42 PM  Result Value Ref Range   Hemoglobin A1C 6.7     Dg Chest 2 View  Result Date: 07/28/2017 CLINICAL DATA:  Wheezing. EXAM: CHEST  2 VIEW COMPARISON:  Chest radiograph December 16, 2016 FINDINGS: Cardiomediastinal silhouette is normal. No pleural effusions or focal consolidations. Mild bronchitic changes. Trachea projects midline and there is no pneumothorax. Soft tissue planes and included osseous structures are non-suspicious. Mild degenerative change of the thoracic spine. IMPRESSION: Mild bronchitic changes.  No focal consolidation. Electronically Signed   By: Elon Alas M.D.   On: 07/28/2017 16:40    Lab Results  Component Value Date   HGBA1C 6.7 07/28/2017    ASSESSMENT AND PLAN:  Veronica Velazquez was seen today for wheezing.  Diagnoses and all orders for this visit:  Mild intermittent asthma with acute exacerbation: Early DM2 and not a contraindication to pred.  Will treat that etiology and see her back in a few weeks to get therapy started for DM2.  -     Discontinue: methylPREDNISolone sodium succinate (SOLU-MEDROL) 125 mg/2 mL injection 125 mg; Inject 2 mLs (125 mg total) into the vein once. -     predniSONE (DELTASONE) 50 MG tablet; Take one tab each morning for 3 days.  Wheezing -     DG Chest 2 View -     albuterol (PROVENTIL) (2.5 MG/3ML) 0.083% nebulizer  solution 2.5 mg; Take 3 mLs (2.5 mg total) by nebulization once. -     ipratropium (ATROVENT) nebulizer solution 0.5 mg; Take 2.5 mLs (0.5 mg total) by nebulization once. -     Discontinue: methylPREDNISolone sodium succinate (SOLU-MEDROL) 125 mg/2 mL injection 125 mg; Inject 2 mLs (125 mg total) into the vein once. -     methylPREDNISolone sodium succinate (SOLU-MEDROL) 125 mg/2 mL injection 125 mg; Inject 2 mLs (125 mg total) into the muscle once.  Screening for diabetes mellitus -      POCT glycosylated hemoglobin (Hb A1C)    The patient is advised to call or return to clinic if she does not see an improvement in symptoms, or to seek the care of the closest emergency department if she worsens with the above plan.   Philis Fendt, MHS, PA-C [facility] 07/29/2017 3:23 PM

## 2017-07-28 NOTE — Patient Instructions (Addendum)
Your A1c has progressed to diabetes.  I would like to see you back next week to start some medication.     IF you received an x-ray today, you will receive an invoice from [facility]. Please contact [facility] at [phone] with questions or concerns regarding your invoice.   IF you received labwork today, you will receive an invoice from [facility]. Please contact [facility] at [phone] with questions or concerns regarding your invoice.   Our billing staff will not be able to assist you with questions regarding bills from these companies.  You will be contacted with the lab results as soon as they are available. The fastest way to get your results is to activate your My Chart account. Instructions are located on the last page of this paperwork. If you have not heard from [place] regarding the results in 2 weeks, please contact this office.

## 2017-07-29 ENCOUNTER — Other Ambulatory Visit: Payer: Self-pay | Admitting: Physician Assistant

## 2017-07-29 ENCOUNTER — Telehealth: Payer: Self-pay | Admitting: Physician Assistant

## 2017-07-29 DIAGNOSIS — E785 Hyperlipidemia, unspecified: Secondary | ICD-10-CM

## 2017-07-29 NOTE — Telephone Encounter (Signed)
Pt was seen on 07/28/17 and simvastatin was not refilled or called in to the pharmacy [facility]   Best number [phone]

## 2017-07-31 NOTE — Telephone Encounter (Signed)
PT IS CALLING BACK ABOUT MED REFILL SIMVASTATIN THE PT IS OUT OF MEDICINE

## 2017-08-01 NOTE — Telephone Encounter (Signed)
Refilled on 10/22 needs OV for additional refills

## 2017-08-05 ENCOUNTER — Ambulatory Visit (INDEPENDENT_AMBULATORY_CARE_PROVIDER_SITE_OTHER): Payer: 59 | Admitting: Family Medicine

## 2017-08-05 VITALS — BP 178/99 | HR 90 | Temp 98.3°F | Resp 16 | Ht 62.0 in | Wt 250.0 lb

## 2017-08-05 DIAGNOSIS — J4520 Mild intermittent asthma, uncomplicated: Secondary | ICD-10-CM

## 2017-08-05 DIAGNOSIS — E039 Hypothyroidism, unspecified: Secondary | ICD-10-CM | POA: Diagnosis not present

## 2017-08-05 DIAGNOSIS — I10 Essential (primary) hypertension: Secondary | ICD-10-CM

## 2017-08-05 DIAGNOSIS — E785 Hyperlipidemia, unspecified: Secondary | ICD-10-CM

## 2017-08-05 DIAGNOSIS — E119 Type 2 diabetes mellitus without complications: Secondary | ICD-10-CM

## 2017-08-05 MED ORDER — ATENOLOL-CHLORTHALIDONE 100-25 MG PO TABS: 0.5000 | ORAL_TABLET | Freq: Every day | ORAL | 1 refills | Status: DC

## 2017-08-05 MED ORDER — ALBUTEROL SULFATE HFA 108 (90 BASE) MCG/ACT IN AERS: 2.0000 | INHALATION_SPRAY | RESPIRATORY_TRACT | 0 refills | Status: DC | PRN

## 2017-08-05 MED ORDER — LISINOPRIL 20 MG PO TABS: 20.0000 mg | ORAL_TABLET | Freq: Every day | ORAL | 1 refills | Status: DC

## 2017-08-05 MED ORDER — SIMVASTATIN 20 MG PO TABS: 20.0000 mg | ORAL_TABLET | Freq: Every day | ORAL | 1 refills | Status: DC

## 2017-08-05 NOTE — Patient Instructions (Signed)
     IF you received an x-ray today, you will receive an invoice from [facility]. Please contact [facility] at [phone] with questions or concerns regarding your invoice.   IF you received labwork today, you will receive an invoice from [facility]. Please contact [facility] at [phone] with questions or concerns regarding your invoice.   Our billing staff will not be able to assist you with questions regarding bills from these companies.  You will be contacted with the lab results as soon as they are available. The fastest way to get your results is to activate your My Chart account. Instructions are located on the last page of this paperwork. If you have not heard from us regarding the results in 2 weeks, please contact this office.     

## 2017-08-06 LAB — LIPID PANEL
Chol/HDL Ratio: 4.6 ratio — ABNORMAL HIGH (ref 0.0–4.4)
Cholesterol, Total: 246 mg/dL — ABNORMAL HIGH (ref 100–199)
HDL: 54 mg/dL (ref >39)
LDL Calculated: 161 mg/dL — ABNORMAL HIGH (ref 0–99)
Triglycerides: 153 mg/dL — ABNORMAL HIGH (ref 0–149)
VLDL Cholesterol Cal: 31 mg/dL (ref 5–40)

## 2017-08-06 LAB — COMPREHENSIVE METABOLIC PANEL
ALT: 13 IU/L (ref 0–32)
AST: 13 IU/L (ref 0–40)
Albumin/Globulin Ratio: 1.3 (ref 1.2–2.2)
Albumin: 4 g/dL (ref 3.5–5.5)
Alkaline Phosphatase: 102 IU/L (ref 39–117)
BUN/Creatinine Ratio: 16 (ref 9–23)
BUN: 11 mg/dL (ref 6–24)
Bilirubin Total: 0.3 mg/dL (ref 0.0–1.2)
CO2: 26 mmol/L (ref 20–29)
Calcium: 9.7 mg/dL (ref 8.7–10.2)
Chloride: 97 mmol/L (ref 96–106)
Creatinine, Ser: 0.7 mg/dL (ref 0.57–1.00)
GFR calc Af Amer: 114 mL/min/{1.73_m2} (ref >59)
GFR calc non Af Amer: 99 mL/min/{1.73_m2} (ref >59)
Globulin, Total: 3 g/dL (ref 1.5–4.5)
Glucose: 105 mg/dL — ABNORMAL HIGH (ref 65–99)
Potassium: 4.3 mmol/L (ref 3.5–5.2)
Sodium: 136 mmol/L (ref 134–144)
Total Protein: 7 g/dL (ref 6.0–8.5)

## 2017-08-06 LAB — CBC WITH DIFFERENTIAL/PLATELET
Basophils Absolute: 0 10*3/uL (ref 0.0–0.2)
Basos: 0 %
EOS (ABSOLUTE): 0.5 10*3/uL — ABNORMAL HIGH (ref 0.0–0.4)
Eos: 5 %
Hematocrit: 42.3 % (ref 34.0–46.6)
Hemoglobin: 14 g/dL (ref 11.1–15.9)
Immature Grans (Abs): 0 10*3/uL (ref 0.0–0.1)
Immature Granulocytes: 0 %
Lymphocytes Absolute: 3.2 10*3/uL — ABNORMAL HIGH (ref 0.7–3.1)
Lymphs: 34 %
MCH: 25.5 pg — ABNORMAL LOW (ref 26.6–33.0)
MCHC: 33.1 g/dL (ref 31.5–35.7)
MCV: 77 fL — ABNORMAL LOW (ref 79–97)
Monocytes Absolute: 0.7 10*3/uL (ref 0.1–0.9)
Monocytes: 8 %
Neutrophils Absolute: 4.8 10*3/uL (ref 1.4–7.0)
Neutrophils: 53 %
Platelets: 385 10*3/uL — ABNORMAL HIGH (ref 150–379)
RBC: 5.5 x10E6/uL — ABNORMAL HIGH (ref 3.77–5.28)
RDW: 15.2 % (ref 12.3–15.4)
WBC: 9.3 10*3/uL (ref 3.4–10.8)

## 2017-08-06 LAB — TSH: TSH: 4.71 u[IU]/mL — ABNORMAL HIGH (ref 0.450–4.500)

## 2017-08-08 ENCOUNTER — Encounter: Payer: Self-pay | Admitting: Family Medicine

## 2017-08-08 NOTE — Progress Notes (Signed)
10/30/20186:14 PM  Veronica Velazquez April 18, 1966, 51 y.o. female [id number]  Chief Complaint  Patient presents with  . Follow-up    diabetes per last office note  . Medication Refill    states she needs all- pt unsure what medications she takes and what for    HPI:   Patient is a 51 y.o. female with past medical history significant for HTN and DM2 who presents today for med refill  Patient's last a1c 07/28/17 of 6.7, diet controlled Patient reports taking meds as prescribed She wonders why she was taken off her thyroid medication, used to take levothyroxine 13mcg Has gained weight, mostly sedentary Reports asthma very intermittent, has not used albuterol in months, current inhaler expired, needs refilled Has no acute concerns today  Depression screen [facility] 2/9 08/05/2017 07/28/2017 12/16/2016  Decreased Interest 0 0 0  Down, Depressed, Hopeless 0 0 0  PHQ - 2 Score 0 0 0    No Known Allergies  Prior to Admission medications   Medication Sig Start Date End Date Taking? Authorizing Provider  albuterol (PROVENTIL HFA;VENTOLIN HFA) 108 (90 Base) MCG/ACT inhaler Inhale 2 puffs into the lungs every 4 (four) hours as needed for wheezing (cough, shortness of breath or wheezing.). 08/05/17  Yes Rutherford Guys, MD  atenolol-chlorthalidone (TENORETIC) 100-25 MG tablet Take 0.5 tablets by mouth daily. 08/05/17  Yes Rutherford Guys, MD  lisinopril (PRINIVIL,ZESTRIL) 5 MG tablet Take 1 tablet (5 mg total) by mouth daily. 08/05/17  Yes Rutherford Guys, MD  simvastatin (ZOCOR) 20 MG tablet Take 1 tablet (20 mg total) by mouth daily. 08/05/17  Yes Rutherford Guys, MD    Past Medical History:  Diagnosis Date  . Asthma   . Depression   . Hyperglycemia   . Hypertension   . Hypothyroidism   . Microcytosis   . Obesity     Past Surgical History:  Procedure Laterality Date  . ABDOMINAL HYSTERECTOMY      Social History  Substance Use Topics  . Smoking status: Never Smoker  .  Smokeless tobacco: Never Used  . Alcohol use No    Family History  Problem Relation Age of Onset  . Diabetes Brother        drug-induced    Review of Systems  Constitutional: Negative for chills and fever.  Respiratory: Negative for cough and shortness of breath.   Cardiovascular: Negative for chest pain, palpitations and leg swelling.  Gastrointestinal: Negative for abdominal pain, nausea and vomiting.     OBJECTIVE:  Blood pressure (!) 178/99, pulse 90, temperature 98.3 F (36.8 C), temperature source Oral, resp. rate 16, height 5\' 2"  (1.575 m), weight 250 lb (113.4 kg), SpO2 96 %.  Wt Readings from Last 3 Encounters:  08/05/17 250 lb (113.4 kg)  07/28/17 248 lb 12.8 oz (112.9 kg)  12/16/16 245 lb 9.6 oz (111.4 kg)    Physical Exam  Constitutional: She is oriented to person, place, and time and well-developed, well-nourished, and in no distress.  HENT:  Head: Normocephalic and atraumatic.  Mouth/Throat: Oropharynx is clear and moist. No oropharyngeal exudate.  Eyes: Pupils are equal, round, and reactive to light. EOM are normal. No scleral icterus.  Neck: Neck supple.  Cardiovascular: Normal rate, regular rhythm and normal heart sounds.  Exam reveals no gallop and no friction rub.   No murmur heard. Pulmonary/Chest: Effort normal and breath sounds normal. She has no wheezes. She has no rales.  Musculoskeletal: She exhibits no edema.  Neurological: She  is alert and oriented to person, place, and time. Gait normal.  Skin: Skin is warm and dry.    ASSESSMENT and PLAN  1. Essential hypertension, benign BP above goal. Discussed importance of med compliance, increasing lisinopril from 5mg  to 20mg .  importance of low carb/low salt diet, regular exercise and healthy weight.  - CBC with Differential - Comprehensive metabolic panel - Lipid panel - atenolol-chlorthalidone (TENORETIC) 100-25 MG tablet; Take 0.5 tablets by mouth daily. - lisinopril (PRINIVIL,ZESTRIL) 20 MG  tablet; Take 1 tablet (20 mg total) by mouth daily.  2. Hypothyroidism, unspecified type - TSH  3. Type 2 diabetes mellitus without complication, without long-term current use of insulin (HCC) A1c this month at goal. See above for LFM modifications. - CBC with Differential - Comprehensive metabolic panel - Lipid panel  4. Hyperlipidemia, unspecified hyperlipidemia type - simvastatin (ZOCOR) 20 MG tablet; Take 1 tablet (20 mg total) by mouth daily.  5. Mild intermittent asthma without complication - albuterol (PROVENTIL HFA;VENTOLIN HFA) 108 (90 Base) MCG/ACT inhaler; Inhale 2 puffs into the lungs every 4 (four) hours as needed for wheezing (cough, shortness of breath or wheezing.).  Return in about 4 weeks (around 09/02/2017).    Rutherford Guys, MD [facility] at [place],  [address] Ph.  [phone] Fax [phone]

## 2017-08-11 ENCOUNTER — Encounter: Payer: Self-pay | Admitting: Family Medicine

## 2017-08-12 ENCOUNTER — Other Ambulatory Visit: Payer: Self-pay | Admitting: Physician Assistant

## 2017-08-12 DIAGNOSIS — I10 Essential (primary) hypertension: Secondary | ICD-10-CM

## 2018-02-01 ENCOUNTER — Encounter: Payer: Self-pay | Admitting: Family Medicine

## 2018-02-01 ENCOUNTER — Other Ambulatory Visit: Payer: Self-pay

## 2018-02-01 ENCOUNTER — Ambulatory Visit (INDEPENDENT_AMBULATORY_CARE_PROVIDER_SITE_OTHER): Payer: 59 | Admitting: Family Medicine

## 2018-02-01 VITALS — BP 122/82 | HR 82 | Temp 102.0°F | Ht 61.81 in | Wt 245.4 lb

## 2018-02-01 DIAGNOSIS — J029 Acute pharyngitis, unspecified: Secondary | ICD-10-CM | POA: Diagnosis not present

## 2018-02-01 DIAGNOSIS — R509 Fever, unspecified: Secondary | ICD-10-CM

## 2018-02-01 DIAGNOSIS — I10 Essential (primary) hypertension: Secondary | ICD-10-CM

## 2018-02-01 DIAGNOSIS — R7303 Prediabetes: Secondary | ICD-10-CM | POA: Diagnosis not present

## 2018-02-01 DIAGNOSIS — E785 Hyperlipidemia, unspecified: Secondary | ICD-10-CM

## 2018-02-01 DIAGNOSIS — J4520 Mild intermittent asthma, uncomplicated: Secondary | ICD-10-CM

## 2018-02-01 DIAGNOSIS — R062 Wheezing: Secondary | ICD-10-CM

## 2018-02-01 LAB — POCT CBC
Granulocyte percent: 77 %G (ref 37–80)
HCT, POC: 43.5 % (ref 37.7–47.9)
Hemoglobin: 13.7 g/dL (ref 12.2–16.2)
Lymph, poc: 1.4 (ref 0.6–3.4)
MCH, POC: 23.9 pg — AB (ref 27–31.2)
MCHC: 31.6 g/dL — AB (ref 31.8–35.4)
MCV: 75.8 fL — AB (ref 80–97)
MID (cbc): 0.9 (ref 0–0.9)
MPV: 9.2 fL (ref 0–99.8)
POC Granulocyte: 7.5 — AB (ref 2–6.9)
POC LYMPH PERCENT: 14.2 %L (ref 10–50)
POC MID %: 8.8 %M (ref 0–12)
Platelet Count, POC: 330 10*3/uL (ref 142–424)
RBC: 5.73 M/uL — AB (ref 4.04–5.48)
RDW, POC: 15.3 %
WBC: 9.8 10*3/uL (ref 4.6–10.2)

## 2018-02-01 LAB — POCT RAPID STREP A (OFFICE): Rapid Strep A Screen: NEGATIVE

## 2018-02-01 MED ORDER — ALBUTEROL SULFATE (2.5 MG/3ML) 0.083% IN NEBU
2.5000 mg | INHALATION_SOLUTION | Freq: Once | RESPIRATORY_TRACT | Status: AC
  Administered 2018-02-01: 2.5 mg via RESPIRATORY_TRACT

## 2018-02-01 MED ORDER — ATENOLOL-CHLORTHALIDONE 100-25 MG PO TABS: 0.5000 | ORAL_TABLET | Freq: Every day | ORAL | 1 refills | Status: DC

## 2018-02-01 MED ORDER — PREDNISONE 20 MG PO TABS: 40.0000 mg | ORAL_TABLET | Freq: Every day | ORAL | 0 refills | Status: DC

## 2018-02-01 MED ORDER — AZITHROMYCIN 250 MG PO TABS: ORAL_TABLET | ORAL | 0 refills | Status: AC

## 2018-02-01 MED ORDER — BENZONATATE 100 MG PO CAPS: 100.0000 mg | ORAL_CAPSULE | Freq: Three times a day (TID) | ORAL | 0 refills | Status: DC | PRN

## 2018-02-01 MED ORDER — SIMVASTATIN 20 MG PO TABS: 20.0000 mg | ORAL_TABLET | Freq: Every day | ORAL | 0 refills | Status: DC

## 2018-02-01 MED ORDER — ALBUTEROL SULFATE HFA 108 (90 BASE) MCG/ACT IN AERS: 2.0000 | INHALATION_SPRAY | RESPIRATORY_TRACT | 3 refills | Status: DC | PRN

## 2018-02-01 NOTE — Progress Notes (Signed)
4/25/201912:15 PM  Veronica Velazquez 09/14/1966, 52 y.o. female [id number]  Chief Complaint  Patient presents with  . Nasal Congestion    having trouble breathing since yesterday, does have asthma  . Medication Refill    proair, atenolol, Simvastin    HPI:   Patient is a 52 y.o. female with past medical history significant for asthma, HTN, HLP, prediabetes who presents today for 2 days of productive cough, SOB, wheezing, sore throat.   She has been using her albuterol about every 4 hours with partial relief.  She is febrile now in clinic.  She reports productive cough, white/yellow sputum She has been sneezing and having nasal congestion, but denies any sinus pain Denies any ear pain Denies any GI or urinary symptoms  She is also requesting refills of chronic meds Denies any side effects Last a1c oct 2018, 6.7  Has mild intermittent asthma, never hosp Last exacerbation in oct 2018  Depression screen [facility] 2/9 02/01/2018 08/05/2017 07/28/2017  Decreased Interest 0 0 0  Down, Depressed, Hopeless 0 0 0  PHQ - 2 Score 0 0 0    No Known Allergies  Prior to Admission medications   Medication Sig Start Date End Date Taking? Authorizing Provider  albuterol (PROVENTIL HFA;VENTOLIN HFA) 108 (90 Base) MCG/ACT inhaler Inhale 2 puffs into the lungs every 4 (four) hours as needed for wheezing (cough, shortness of breath or wheezing.). 08/05/17  Yes Rutherford Guys, MD  atenolol-chlorthalidone (TENORETIC) 100-25 MG tablet Take 0.5 tablets by mouth daily. 08/05/17  Yes Rutherford Guys, MD  lisinopril (PRINIVIL,ZESTRIL) 20 MG tablet Take 1 tablet (20 mg total) by mouth daily. 08/05/17  Yes Rutherford Guys, MD  simvastatin (ZOCOR) 20 MG tablet Take 1 tablet (20 mg total) by mouth daily. 08/05/17  Yes Rutherford Guys, MD    Past Medical History:  Diagnosis Date  . Asthma   . Depression   . Hyperglycemia   . Hypertension   . Hypothyroidism   . Microcytosis   . Obesity   .  Prediabetes     Past Surgical History:  Procedure Laterality Date  . ABDOMINAL HYSTERECTOMY      Social History   Tobacco Use  . Smoking status: Never Smoker  . Smokeless tobacco: Never Used  Substance Use Topics  . Alcohol use: No    Family History  Problem Relation Age of Onset  . Diabetes Brother        drug-induced    Review of Systems  Constitutional: Positive for fever. Negative for chills.  HENT: Positive for congestion and sore throat. Negative for ear pain and sinus pain.   Respiratory: Positive for cough, sputum production, shortness of breath and wheezing.   Cardiovascular: Negative for chest pain, palpitations and leg swelling.  Gastrointestinal: Negative for abdominal pain, diarrhea, nausea and vomiting.  Genitourinary: Negative for dysuria and hematuria.   Per hpi  OBJECTIVE:  Blood pressure 122/82, pulse 82, temperature (!) 102 F (38.9 C), temperature source Oral, height 5' 1.81" (1.57 m), weight 245 lb 6.4 oz (111.3 kg), SpO2 93 %.  Physical Exam  Constitutional: She is oriented to person, place, and time.  HENT:  Head: Normocephalic and atraumatic.  Right Ear: Hearing, tympanic membrane, external ear and ear canal normal.  Left Ear: Hearing, tympanic membrane, external ear and ear canal normal.  Mouth/Throat: Oropharynx is clear and moist.  Eyes: Pupils are equal, round, and reactive to light. EOM are normal.  Neck: Neck supple.  Cardiovascular: Normal  rate, regular rhythm and normal heart sounds. Exam reveals no gallop and no friction rub.  No murmur heard. Pulmonary/Chest: Effort normal. She has wheezes. She has no rhonchi. She has no rales.  Wheezing completely resolved after neb treatment  Lymphadenopathy:    She has no cervical adenopathy.  Neurological: She is alert and oriented to person, place, and time.  Skin: Skin is warm and dry.    Results for orders placed or performed in visit on 02/01/18 (from the past 24 hour(s))  Rapid Strep  A     Status: None   Collection Time: 02/01/18 12:34 PM  Result Value Ref Range   Rapid Strep A Screen Negative Negative  POCT CBC     Status: Abnormal   Collection Time: 02/01/18 12:42 PM  Result Value Ref Range   WBC 9.8 4.6 - 10.2 K/uL   Lymph, poc 1.4 0.6 - 3.4   POC LYMPH PERCENT 14.2 10 - 50 %L   MID (cbc) 0.9 0 - 0.9   POC MID % 8.8 0 - 12 %M   POC Granulocyte 7.5 (A) 2 - 6.9   Granulocyte percent 77.0 37 - 80 %G   RBC 5.73 (A) 4.04 - 5.48 M/uL   Hemoglobin 13.7 12.2 - 16.2 g/dL   HCT, POC 43.5 37.7 - 47.9 %   MCV 75.8 (A) 80 - 97 fL   MCH, POC 23.9 (A) 27 - 31.2 pg   MCHC 31.6 (A) 31.8 - 35.4 g/dL   RDW, POC 15.3 %   Platelet Count, POC 330 142 - 424 K/uL   MPV 9.2 0 - 99.8 fL     ASSESSMENT and PLAN  1. Wheezing - albuterol (PROVENTIL) (2.5 MG/3ML) 0.083% nebulizer solution 2.5 mg Discussed with patient and daughter that this is most likely viral illness exacerbating her asthma, but given her risk factors and high fever will treat empirically for pneumonia. New meds r/se/b reviewed. RTC precautions given. Otherwise lab orders and refills per below.   2. Fever, unspecified - POCT CBC  3. Sore throat - Rapid Strep A - Culture, Group A Strep  4. Essential hypertension, benign At goal, cont current meds - atenolol-chlorthalidone (TENORETIC) 100-25 MG tablet; Take 0.5 tablets by mouth daily. - Comprehensive metabolic panel  5. Hyperlipidemia, unspecified hyperlipidemia type - simvastatin (ZOCOR) 20 MG tablet; Take 1 tablet (20 mg total) by mouth daily. - Comprehensive metabolic panel - Lipid panel  6. Mild intermittent asthma without complication - albuterol (PROVENTIL HFA;VENTOLIN HFA) 108 (90 Base) MCG/ACT inhaler; Inhale 2 puffs into the lungs every 4 (four) hours as needed for wheezing (cough, shortness of breath or wheezing.).  7. Pre-diabetes - Hemoglobin A1c  Other orders - azithromycin (ZITHROMAX) 250 MG tablet; Take 2 tablets (500 mg total) by  mouth daily for 1 day, THEN 1 tablet (250 mg total) daily for 4 days. - benzonatate (TESSALON) 100 MG capsule; Take 1-2 capsules (100-200 mg total) by mouth 3 (three) times daily as needed for cough. - predniSONE (DELTASONE) 20 MG tablet; Take 2 tablets (40 mg total) by mouth daily with breakfast.  Return in about 5 days (around 02/06/2018).    Rutherford Guys, MD [facility] at [facility] [place], [place] [address] Ph.  [phone] Fax [phone]

## 2018-02-01 NOTE — Patient Instructions (Addendum)
1. I am treating your current symptoms as a pneumonia with an asthma exacerbation, I have sent an antibiotic (azithromycin), prednisone and medication for cough (tessalon pearles). This still might be a viral etiology but given risk factors will treat as above. Please return to clinic is unable to manage fever with over the counter tylenol and ibuprofen, having worsening cough and SOB, overall feeling worse.      IF you received an x-ray today, you will receive an invoice from [facility]. Please contact [facility] at [phone] with questions or concerns regarding your invoice.   IF you received labwork today, you will receive an invoice from [facility]. Please contact [facility] at [phone] with questions or concerns regarding your invoice.   Our billing staff will not be able to assist you with questions regarding bills from these companies.  You will be contacted with the lab results as soon as they are available. The fastest way to get your results is to activate your My Chart account. Instructions are located on the last page of this paperwork. If you have not heard from [place] regarding the results in 2 weeks, please contact this office.

## 2018-02-02 LAB — LIPID PANEL
Chol/HDL Ratio: 4.2 ratio (ref 0.0–4.4)
Cholesterol, Total: 248 mg/dL — ABNORMAL HIGH (ref 100–199)
HDL: 59 mg/dL (ref >39)
LDL Calculated: 165 mg/dL — ABNORMAL HIGH (ref 0–99)
Triglycerides: 120 mg/dL (ref 0–149)
VLDL Cholesterol Cal: 24 mg/dL (ref 5–40)

## 2018-02-02 LAB — COMPREHENSIVE METABOLIC PANEL
ALT: 13 IU/L (ref 0–32)
AST: 16 IU/L (ref 0–40)
Albumin/Globulin Ratio: 1.4 (ref 1.2–2.2)
Albumin: 4.4 g/dL (ref 3.5–5.5)
Alkaline Phosphatase: 109 IU/L (ref 39–117)
BUN/Creatinine Ratio: 14 (ref 9–23)
BUN: 9 mg/dL (ref 6–24)
Bilirubin Total: 0.4 mg/dL (ref 0.0–1.2)
CO2: 24 mmol/L (ref 20–29)
Calcium: 9.8 mg/dL (ref 8.7–10.2)
Chloride: 99 mmol/L (ref 96–106)
Creatinine, Ser: 0.66 mg/dL (ref 0.57–1.00)
GFR calc Af Amer: 116 mL/min/{1.73_m2} (ref >59)
GFR calc non Af Amer: 100 mL/min/{1.73_m2} (ref >59)
Globulin, Total: 3.1 g/dL (ref 1.5–4.5)
Glucose: 113 mg/dL — ABNORMAL HIGH (ref 65–99)
Potassium: 4.4 mmol/L (ref 3.5–5.2)
Sodium: 137 mmol/L (ref 134–144)
Total Protein: 7.5 g/dL (ref 6.0–8.5)

## 2018-02-02 LAB — HEMOGLOBIN A1C
Est. average glucose Bld gHb Est-mCnc: 146 mg/dL
Hgb A1c MFr Bld: 6.7 % — ABNORMAL HIGH (ref 4.8–5.6)

## 2018-02-03 LAB — CULTURE, GROUP A STREP: Strep A Culture: NEGATIVE

## 2018-02-07 ENCOUNTER — Other Ambulatory Visit: Payer: Self-pay | Admitting: Family Medicine

## 2018-02-07 DIAGNOSIS — I10 Essential (primary) hypertension: Secondary | ICD-10-CM

## 2018-02-07 DIAGNOSIS — E785 Hyperlipidemia, unspecified: Secondary | ICD-10-CM

## 2018-02-13 ENCOUNTER — Telehealth: Payer: Self-pay | Admitting: Family Medicine

## 2018-02-13 NOTE — Telephone Encounter (Signed)
-----   Message from Rutherford Guys, MD sent at 02/13/2018  8:20 AM EDT ----- please schedule patient to discuss lab results. Thanks. You will need urdu speaking interpreter.

## 2018-02-13 NOTE — Telephone Encounter (Signed)
Tried calling pt--no answer and no voicemail--please schedule patient with Dr Pamella Pert to discuss lab results if they call back.

## 2018-10-22 ENCOUNTER — Ambulatory Visit: Payer: Self-pay | Admitting: *Deleted

## 2018-10-22 ENCOUNTER — Ambulatory Visit: Payer: 59 | Admitting: Emergency Medicine

## 2018-10-22 ENCOUNTER — Encounter: Payer: Self-pay | Admitting: Emergency Medicine

## 2018-10-22 ENCOUNTER — Other Ambulatory Visit: Payer: Self-pay

## 2018-10-22 VITALS — BP 126/82 | HR 74 | Temp 102.0°F | Resp 18 | Ht 61.0 in | Wt 283.0 lb

## 2018-10-22 DIAGNOSIS — R509 Fever, unspecified: Secondary | ICD-10-CM | POA: Diagnosis not present

## 2018-10-22 DIAGNOSIS — Z8709 Personal history of other diseases of the respiratory system: Secondary | ICD-10-CM

## 2018-10-22 DIAGNOSIS — J101 Influenza due to other identified influenza virus with other respiratory manifestations: Secondary | ICD-10-CM | POA: Insufficient documentation

## 2018-10-22 DIAGNOSIS — R062 Wheezing: Secondary | ICD-10-CM | POA: Insufficient documentation

## 2018-10-22 DIAGNOSIS — J029 Acute pharyngitis, unspecified: Secondary | ICD-10-CM | POA: Diagnosis not present

## 2018-10-22 LAB — POCT INFLUENZA A/B
INFLUENZA A, POC: POSITIVE — AB
INFLUENZA B, POC: NEGATIVE

## 2018-10-22 LAB — POCT RAPID STREP A (OFFICE): RAPID STREP A SCREEN: NEGATIVE

## 2018-10-22 MED ORDER — OSELTAMIVIR PHOSPHATE 75 MG PO CAPS: 75.0000 mg | ORAL_CAPSULE | Freq: Two times a day (BID) | ORAL | 0 refills | Status: AC

## 2018-10-22 MED ORDER — PREDNISONE 20 MG PO TABS: 40.0000 mg | ORAL_TABLET | Freq: Every day | ORAL | 0 refills | Status: AC

## 2018-10-22 NOTE — Telephone Encounter (Signed)
Daughter is calling- mother is not with her- daughter is her translator and she is giving information- appointment given on information from daughter.  Reason for Disposition . Asthma medicine (nebulizer or inhaler) is needed more frequently than q 4 hours to keep you comfortable    Unsure of current status of patient- daughter is calling for mother and she is not with her. Mother does not speak [place]. Appointment given with perimeters that if she is worse when she sees her- she should cancel appointment and take her to ED.  Answer Assessment - Initial Assessment Questions 1. RESPIRATORY STATUS: "Describe your breathing?" (e.g., wheezing, shortness of breath, unable to speak, severe coughing)      Patient is using inhaler for asthma attack- she has a clod and she is having trouble with her breathing 2. ONSET: "When did this asthma attack begin?"      SOB since Saturday got worse yestreday 3. TRIGGER: "What do you think triggered this attack?" (e.g., URI, exposure to pollen or other allergen, tobacco smoke)      URI- cold 4. PEAK EXPIRATORY FLOW RATE (PEFR): "Do you use a peak flow meter?" If so, ask: "What's the current peak flow? What's your personal best peak flow?"      no 5. SEVERITY: "How bad is this attack?"    - MILD: No SOB at rest, mild SOB with walking, speaks normally in sentences, can lay down, no retractions, pulse < 100. (GREEN Zone: PEFR 80-100%)   - MODERATE: SOB at rest, SOB with minimal exertion and prefers to sit, cannot lie down flat, speaks in phrases, mild retractions, audible wheezing, pulse 100-120. (YELLOW Zone: PEFR 50-80%)    - SEVERE: Very SOB at rest, speaks in single words, struggling to breathe, sitting hunched forward, retractions, usually loud wheezing, sometimes minimal wheezing because of decreased air movement, pulse > 120. (RED Zone: PEFR < 50%).      moderate 6. MEDICATIONS (Inhaler or nebs): "What are your asthma medications?" and "What treatments have you  given so far?"    - Quick-relief: albuterol, metaproterenol, salbutamol, or other inhaled or nebulized beta-agonist medicines   - Long-term-control: steroids, cromolyn, or other anti-inflammatory medicines.     Quick relief inhaler 7. OTHER SYMPTOMS: "Do you have any other symptoms? (e.g., runny nose, chest pain, fever)     Cough and runny nose-  Fever during the week end- not sure what it is now 8. PREGNANCY: "Is there any chance you are pregnant?" "When was your last menstrual period?"     n/a  Protocols used: ASTHMA ATTACK-A-AH

## 2018-10-22 NOTE — Patient Instructions (Addendum)
   If you have lab work done today you will be contacted with your lab results within the next 2 weeks.  If you have not heard from us then please contact us. The fastest way to get your results is to register for My Chart.   IF you received an x-ray today, you will receive an invoice from [facility]. Please contact [facility] at [phone] with questions or concerns regarding your invoice.   IF you received labwork today, you will receive an invoice from [facility]. Please contact [facility] at [phone] with questions or concerns regarding your invoice.   Our billing staff will not be able to assist you with questions regarding bills from these companies.  You will be contacted with the lab results as soon as they are available. The fastest way to get your results is to activate your My Chart account. Instructions are located on the last page of this paperwork. If you have not heard from us regarding the results in 2 weeks, please contact this office.     Influenza, Adult Influenza is also called "the flu." It is an infection in the lungs, nose, and throat (respiratory tract). It is caused by a virus. The flu causes symptoms that are similar to symptoms of a cold. It also causes a high fever and body aches. The flu spreads easily from person to person (is contagious). Getting a flu shot (influenza vaccination) every year is the best way to prevent the flu. What are the causes? This condition is caused by the influenza virus. You can get the virus by:  Breathing in droplets that are in the air from the cough or sneeze of a person who has the virus.  Touching something that has the virus on it (is contaminated) and then touching your mouth, nose, or eyes. What increases the risk? Certain things may make you more likely to get the flu. These include:  Not washing your hands often.  Having close contact with many people during cold and flu season.  Touching your  mouth, eyes, or nose without first washing your hands.  Not getting a flu shot every year. You may have a higher risk for the flu, along with serious problems such as a lung infection (pneumonia), if you:  Are older than 65.  Are pregnant.  Have a weakened disease-fighting system (immune system) because of a disease or taking certain medicines.  Have a long-term (chronic) illness, such as: ? Heart, kidney, or lung disease. ? Diabetes. ? Asthma.  Have a liver disorder.  Are very overweight (morbidly obese).  Have anemia. This is a condition that affects your red blood cells. What are the signs or symptoms? Symptoms usually begin suddenly and last 4-14 days. They may include:  Fever and chills.  Headaches, body aches, or muscle aches.  Sore throat.  Cough.  Runny or stuffy (congested) nose.  Chest discomfort.  Not wanting to eat as much as normal (poor appetite).  Weakness or feeling tired (fatigue).  Dizziness.  Feeling sick to your stomach (nauseous) or throwing up (vomiting). How is this treated? If the flu is found early, you can be treated with medicine that can help reduce how bad the illness is and how long it lasts (antiviral medicine). This may be given by mouth (orally) or through an IV tube. Taking care of yourself at home can help your symptoms get better. Your doctor may suggest:  Taking over-the-counter medicines.  Drinking plenty of fluids. The flu often   goes away on its own. If you have very bad symptoms or other problems, you may be treated in a hospital. Follow these instructions at home:     Activity  Rest as needed. Get plenty of sleep.  Stay home from work or school as told by your doctor. ? Do not leave home until you do not have a fever for 24 hours without taking medicine. ? Leave home only to visit your doctor. Eating and drinking  Take an ORS (oral rehydration solution). This is a drink that is sold at pharmacies and  stores.  Drink enough fluid to keep your pee (urine) pale yellow.  Drink clear fluids in small amounts as you are able. Clear fluids include: ? Water. ? Ice chips. ? Fruit juice that has water added (diluted fruit juice). ? Low-calorie sports drinks.  Eat bland, easy-to-digest foods in small amounts as you are able. These foods include: ? Bananas. ? Applesauce. ? Rice. ? Lean meats. ? Toast. ? Crackers.  Do not eat or drink: ? Fluids that have a lot of sugar or caffeine. ? Alcohol. ? Spicy or fatty foods. General instructions  Take over-the-counter and prescription medicines only as told by your doctor.  Use a cool mist humidifier to add moisture to the air in your home. This can make it easier for you to breathe.  Cover your mouth and nose when you cough or sneeze.  Wash your hands with soap and water often, especially after you cough or sneeze. If you cannot use soap and water, use alcohol-based hand sanitizer.  Keep all follow-up visits as told by your doctor. This is important. How is this prevented?   Get a flu shot every year. You may get the flu shot in late summer, fall, or winter. Ask your doctor when you should get your flu shot.  Avoid contact with people who are sick during fall and winter (cold and flu season). Contact a doctor if:  You get new symptoms.  You have: ? Chest pain. ? Watery poop (diarrhea). ? A fever.  Your cough gets worse.  You start to have more mucus.  You feel sick to your stomach.  You throw up. Get help right away if you:  Have shortness of breath.  Have trouble breathing.  Have skin or nails that turn a bluish color.  Have very bad pain or stiffness in your neck.  Get a sudden headache.  Get sudden pain in your face or ear.  Cannot eat or drink without throwing up. Summary  Influenza ("the flu") is an infection in the lungs, nose, and throat. It is caused by a virus.  Take over-the-counter and prescription  medicines only as told by your doctor.  Getting a flu shot every year is the best way to avoid getting the flu. This information is not intended to replace advice given to you by your health care provider. Make sure you discuss any questions you have with your health care provider. Document Released: 07/05/2008 Document Revised: 03/14/2018 Document Reviewed: 03/14/2018 Elsevier Interactive Patient Education  2019 [employer]  

## 2018-10-22 NOTE — Progress Notes (Signed)
Veronica Velazquez 53 y.o.   Chief Complaint  Patient presents with  . Cough    pt states she has had cough x 2 [facility] with [facility]   . Fever    with fatigue x 2 days     HISTORY OF PRESENT ILLNESS: This is a 53 y.o. female with history of asthma complaining of a 2-day history of cough and fever with generalized achiness.  No other significant symptoms.  HPI   Prior to Admission medications   Medication Sig Start Date End Date Taking? Authorizing Provider  albuterol (PROVENTIL HFA;VENTOLIN HFA) 108 (90 Base) MCG/ACT inhaler Inhale 2 puffs into the lungs every 4 (four) hours as needed for wheezing (cough, shortness of breath or wheezing.). 02/01/18  Yes Rutherford Guys, MD  atenolol-chlorthalidone (TENORETIC) 100-25 MG tablet Take 0.5 tablets by mouth daily. 02/01/18  Yes Rutherford Guys, MD  lisinopril (PRINIVIL,ZESTRIL) 20 MG tablet TAKE 1 TABLET BY MOUTH EVERY DAY 02/07/18  Yes Rutherford Guys, MD  simvastatin (ZOCOR) 20 MG tablet TAKE 1 TABLET BY MOUTH EVERY DAY 02/07/18  Yes Rutherford Guys, MD    No Known Allergies  Patient Active Problem List   Diagnosis Date Noted  . Asthma 09/12/2012  . HTN (hypertension) 09/12/2012  . Hypothyroidism   . Depression   . Obesity   . Microcytosis     Past Medical History:  Diagnosis Date  . Asthma   . Depression   . Hyperglycemia   . Hypertension   . Hypothyroidism   . Microcytosis   . Obesity   . Prediabetes     Past Surgical History:  Procedure Laterality Date  . ABDOMINAL HYSTERECTOMY      Social History   Socioeconomic History  . Marital status: Married    Spouse name: Ghulam  . Number of children: 3  . Years of education: 7  . Highest education level: Not on file  Occupational History  . Occupation: homemaker  Social Needs  . Financial resource strain: Not on file  . Food insecurity:    Worry: Not on file    Inability: Not on file  . Transportation needs:    Medical: Not on file    Non-medical: Not on file    Tobacco Use  . Smoking status: Never Smoker  . Smokeless tobacco: Never Used  Substance and Sexual Activity  . Alcohol use: No  . Drug use: No  . Sexual activity: Yes    Partners: Male  Lifestyle  . Physical activity:    Days per week: Not on file    Minutes per session: Not on file  . Stress: Not on file  Relationships  . Social connections:    Talks on phone: Not on file    Gets together: Not on file    Attends religious service: Not on file    Active member of club or organization: Not on file    Attends meetings of clubs or organizations: Not on file    Relationship status: Not on file  . Intimate partner violence:    Fear of current or ex partner: Not on file    Emotionally abused: Not on file    Physically abused: Not on file    Forced sexual activity: Not on file  Other Topics Concern  . Not on file  Social History Narrative   Lives with her husband and two daughters.  They care for their 54 year-old granddaughter during the day while her mother (their daughter)  works.    Family History  Problem Relation Age of Onset  . Diabetes Brother        drug-induced     Review of Systems  Constitutional: Positive for chills, fever and malaise/fatigue.  HENT: Negative for congestion, nosebleeds and sore throat.   Eyes: Negative.   Respiratory: Positive for cough and wheezing. Negative for hemoptysis, sputum production and shortness of breath.   Cardiovascular: Negative.  Negative for chest pain and palpitations.  Gastrointestinal: Negative for abdominal pain, nausea and vomiting.  Musculoskeletal: Positive for myalgias.  Skin: Negative.   Neurological: Negative.  Negative for dizziness and headaches.  Endo/Heme/Allergies: Negative.   All other systems reviewed and are negative.   Vitals:   10/22/18 1610  BP: 126/82  Pulse: 74  Resp: 18  Temp: (!) 102 F (38.9 C)  SpO2: 93%    Physical Exam Vitals signs reviewed.  Constitutional:      Appearance: She is  obese.  HENT:     Head: Normocephalic and atraumatic.     Nose: Nose normal.     Mouth/Throat:     Mouth: Mucous membranes are moist.     Pharynx: Oropharynx is clear.  Eyes:     Extraocular Movements: Extraocular movements intact.     Conjunctiva/sclera: Conjunctivae normal.     Pupils: Pupils are equal, round, and reactive to light.  Neck:     Musculoskeletal: Normal range of motion and neck supple.  Cardiovascular:     Rate and Rhythm: Normal rate and regular rhythm.     Heart sounds: Normal heart sounds.  Pulmonary:     Effort: Pulmonary effort is normal.     Breath sounds: Wheezing present. No rhonchi or rales.  Musculoskeletal: Normal range of motion.  Skin:    General: Skin is warm and dry.     Capillary Refill: Capillary refill takes less than 2 seconds.  Neurological:     General: No focal deficit present.     Mental Status: She is alert and oriented to person, place, and time.  Psychiatric:        Mood and Affect: Mood normal.        Behavior: Behavior normal.   A total of 25 minutes was spent in the room with the patient, greater than 50% of which was in counseling/coordination of care regarding diagnosis, treatment, medications, and need for follow-up if no better or worse.   Results for orders placed or performed in visit on 10/22/18 (from the past 24 hour(s))  POCT rapid strep A     Status: None   Collection Time: 10/22/18  4:22 PM  Result Value Ref Range   Rapid Strep A Screen Negative Negative  POCT Influenza A/B     Status: Abnormal   Collection Time: 10/22/18  4:23 PM  Result Value Ref Range   Influenza A, POC Positive (A) Negative   Influenza B, POC Negative Negative    ASSESSMENT & PLAN: Nolah was seen today for cough and fever.  Diagnoses and all orders for this visit:  Influenza A -     oseltamivir (TAMIFLU) 75 MG capsule; Take 1 capsule (75 mg total) by mouth 2 (two) times daily for 5 days.  Fever, unspecified -     POCT Influenza  A/B  Sore throat -     POCT rapid strep A  History of asthma  Wheezing -     predniSONE (DELTASONE) 20 MG tablet; Take 2 tablets (40 mg total) by mouth daily  with breakfast for 5 days.    Patient Instructions       If you have lab work done today you will be contacted with your lab results within the next 2 weeks.  If you have not heard from [place] then please contact us. The fastest way to get your results is to register for My Chart.   IF you received an x-ray today, you will receive an invoice from [facility]. Please contact [facility] at [phone] with questions or concerns regarding your invoice.   IF you received labwork today, you will receive an invoice from [facility]. Please contact [facility] at [phone] with questions or concerns regarding your invoice.   Our billing staff will not be able to assist you with questions regarding bills from these companies.  You will be contacted with the lab results as soon as they are available. The fastest way to get your results is to activate your My Chart account. Instructions are located on the last page of this paperwork. If you have not heard from [place] regarding the results in 2 weeks, please contact this office.      Influenza, Adult Influenza is also called "the flu." It is an infection in the lungs, nose, and throat (respiratory tract). It is caused by a virus. The flu causes symptoms that are similar to symptoms of a cold. It also causes a high fever and body aches. The flu spreads easily from person to person (is contagious). Getting a flu shot (influenza vaccination) every year is the best way to prevent the flu. What are the causes? This condition is caused by the influenza virus. You can get the virus by:  Breathing in droplets that are in the air from the cough or sneeze of a person who has the virus.  Touching something that has the virus on it (is contaminated) and then touching your mouth, nose,  or eyes. What increases the risk? Certain things may make you more likely to get the flu. These include:  Not washing your hands often.  Having close contact with many people during cold and flu season.  Touching your mouth, eyes, or nose without first washing your hands.  Not getting a flu shot every year. You may have a higher risk for the flu, along with serious problems such as a lung infection (pneumonia), if you:  Are older than 65.  Are pregnant.  Have a weakened disease-fighting system (immune system) because of a disease or taking certain medicines.  Have a long-term (chronic) illness, such as: ? Heart, kidney, or lung disease. ? Diabetes. ? Asthma.  Have a liver disorder.  Are very overweight (morbidly obese).  Have anemia. This is a condition that affects your red blood cells. What are the signs or symptoms? Symptoms usually begin suddenly and last 4-14 days. They may include:  Fever and chills.  Headaches, body aches, or muscle aches.  Sore throat.  Cough.  Runny or stuffy (congested) nose.  Chest discomfort.  Not wanting to eat as much as normal (poor appetite).  Weakness or feeling tired (fatigue).  Dizziness.  Feeling sick to your stomach (nauseous) or throwing up (vomiting). How is this treated? If the flu is found early, you can be treated with medicine that can help reduce how bad the illness is and how long it lasts (antiviral medicine). This may be given by mouth (orally) or through an IV tube. Taking care of yourself at home can help your symptoms get better. Your doctor  may suggest:  Taking over-the-counter medicines.  Drinking plenty of fluids. The flu often goes away on its own. If you have very bad symptoms or other problems, you may be treated in a hospital. Follow these instructions at home:     Activity  Rest as needed. Get plenty of sleep.  Stay home from work or school as told by your doctor. ? Do not leave home until  you do not have a fever for 24 hours without taking medicine. ? Leave home only to visit your doctor. Eating and drinking  Take an ORS (oral rehydration solution). This is a drink that is sold at pharmacies and stores.  Drink enough fluid to keep your pee (urine) pale yellow.  Drink clear fluids in small amounts as you are able. Clear fluids include: ? Water. ? Ice chips. ? Fruit juice that has water added (diluted fruit juice). ? Low-calorie sports drinks.  Eat bland, easy-to-digest foods in small amounts as you are able. These foods include: ? Bananas. ? Applesauce. ? Rice. ? Lean meats. ? Toast. ? Crackers.  Do not eat or drink: ? Fluids that have a lot of sugar or caffeine. ? Alcohol. ? Spicy or fatty foods. General instructions  Take over-the-counter and prescription medicines only as told by your doctor.  Use a cool mist humidifier to add moisture to the air in your home. This can make it easier for you to breathe.  Cover your mouth and nose when you cough or sneeze.  Wash your hands with soap and water often, especially after you cough or sneeze. If you cannot use soap and water, use alcohol-based hand sanitizer.  Keep all follow-up visits as told by your doctor. This is important. How is this prevented?   Get a flu shot every year. You may get the flu shot in late summer, fall, or winter. Ask your doctor when you should get your flu shot.  Avoid contact with people who are sick during fall and winter (cold and flu season). Contact a doctor if:  You get new symptoms.  You have: ? Chest pain. ? Watery poop (diarrhea). ? A fever.  Your cough gets worse.  You start to have more mucus.  You feel sick to your stomach.  You throw up. Get help right away if you:  Have shortness of breath.  Have trouble breathing.  Have skin or nails that turn a bluish color.  Have very bad pain or stiffness in your neck.  Get a sudden headache.  Get sudden pain  in your face or ear.  Cannot eat or drink without throwing up. Summary  Influenza ("the flu") is an infection in the lungs, nose, and throat. It is caused by a virus.  Take over-the-counter and prescription medicines only as told by your doctor.  Getting a flu shot every year is the best way to avoid getting the flu. This information is not intended to replace advice given to you by your health care provider. Make sure you discuss any questions you have with your health care provider. Document Released: 07/05/2008 Document Revised: 03/14/2018 Document Reviewed: 03/14/2018 Elsevier Interactive Patient Education  2019 Elsevier Inc.      Agustina Caroli, MD [facility]

## 2018-11-08 ENCOUNTER — Other Ambulatory Visit: Payer: Self-pay | Admitting: Family Medicine

## 2018-11-08 DIAGNOSIS — E785 Hyperlipidemia, unspecified: Secondary | ICD-10-CM

## 2018-11-08 NOTE — Telephone Encounter (Signed)
Requested Prescriptions  Pending Prescriptions Disp Refills  . simvastatin (ZOCOR) 20 MG tablet [Pharmacy Med Name: SIMVASTATIN 20 MG TABLET] 90 tablet 0    Sig: TAKE 1 TABLET BY MOUTH EVERY DAY     Cardiovascular:  Antilipid - Statins Failed - 11/08/2018  2:03 AM      Failed - Total Cholesterol in normal range and within 360 days    Cholesterol, Total  Date Value Ref Range Status  02/01/2018 248 (H) 100 - 199 mg/dL Final         Failed - LDL in normal range and within 360 days    LDL Calculated  Date Value Ref Range Status  02/01/2018 165 (H) 0 - 99 mg/dL Final         Failed - Valid encounter within last 12 months    Recent Outpatient Visits          2 weeks ago Influenza A   [facility], Ines Bloomer, MD   9 months ago Wheezing   [facility], Lilia Argue, MD   1 year ago Essential hypertension, benign   [facility], Lilia Argue, MD   1 year ago Mild intermittent asthma with acute exacerbation   [facility] at [place], PA-C   1 year ago Mild persistent extrinsic asthma without complication   [facility] at [facility], PA-C             Passed - HDL in normal range and within 360 days    HDL  Date Value Ref Range Status  02/01/2018 59 >39 mg/dL Final         Passed - Triglycerides in normal range and within 360 days    Triglycerides  Date Value Ref Range Status  02/01/2018 120 0 - 149 mg/dL Final         Passed - Patient is not pregnant

## 2018-12-22 ENCOUNTER — Ambulatory Visit (INDEPENDENT_AMBULATORY_CARE_PROVIDER_SITE_OTHER): Payer: 59

## 2018-12-22 ENCOUNTER — Other Ambulatory Visit: Payer: Self-pay

## 2018-12-22 ENCOUNTER — Ambulatory Visit: Payer: 59 | Admitting: Osteopathic Medicine

## 2018-12-22 ENCOUNTER — Encounter: Payer: Self-pay | Admitting: Osteopathic Medicine

## 2018-12-22 VITALS — BP 122/82 | HR 96 | Temp 98.1°F | Resp 18 | Ht 61.0 in | Wt 252.4 lb

## 2018-12-22 DIAGNOSIS — J4521 Mild intermittent asthma with (acute) exacerbation: Secondary | ICD-10-CM

## 2018-12-22 DIAGNOSIS — R05 Cough: Secondary | ICD-10-CM

## 2018-12-22 DIAGNOSIS — R059 Cough, unspecified: Secondary | ICD-10-CM

## 2018-12-22 MED ORDER — IPRATROPIUM-ALBUTEROL 0.5-2.5 (3) MG/3ML IN SOLN
3.0000 mL | Freq: Once | RESPIRATORY_TRACT | Status: AC
  Administered 2018-12-22: 3 mL via RESPIRATORY_TRACT

## 2018-12-22 MED ORDER — PREDNISONE 20 MG PO TABS: 20.0000 mg | ORAL_TABLET | Freq: Two times a day (BID) | ORAL | 0 refills | Status: DC

## 2018-12-22 MED ORDER — GUAIFENESIN-CODEINE 100-10 MG/5ML PO SYRP: 5.0000 mL | ORAL_SOLUTION | Freq: Four times a day (QID) | ORAL | 0 refills | Status: DC | PRN

## 2018-12-22 MED ORDER — ALBUTEROL SULFATE HFA 108 (90 BASE) MCG/ACT IN AERS: 2.0000 | INHALATION_SPRAY | RESPIRATORY_TRACT | 3 refills | Status: DC | PRN

## 2018-12-22 NOTE — Progress Notes (Signed)
HPI: Veronica Velazquez is a 53 y.o. female who  has a past medical history of Asthma, Depression, Hyperglycemia, Hypertension, Hypothyroidism, Microcytosis, Obesity, and Prediabetes.  she presents to [facility] today, 12/22/18,  for chief complaint of: Chief Complaint  Patient presents with  . Asthma    cough, sob, wheezing x 3 day     . Context: hx asthma, albuterol use every few months . Location/Quality: wheezing/tightness in chest, sinus congestion, cough . Duration: 3 days   Patient is accompanied by husband who assists with history-taking.   Past medical, surgical, social and family history reviewed:  Patient Active Problem List   Diagnosis Date Noted  . Fever, unspecified 10/22/2018  . Sore throat 10/22/2018  . Influenza A 10/22/2018  . History of asthma 10/22/2018  . Wheezing 10/22/2018  . Asthma 09/12/2012  . HTN (hypertension) 09/12/2012  . Hypothyroidism   . Depression   . Obesity   . Microcytosis     Past Surgical History:  Procedure Laterality Date  . ABDOMINAL HYSTERECTOMY      Social History   Tobacco Use  . Smoking status: Never Smoker  . Smokeless tobacco: Never Used  Substance Use Topics  . Alcohol use: No    Family History  Problem Relation Age of Onset  . Diabetes Brother        drug-induced     Current medication list and allergy/intolerance information reviewed:    Current Outpatient Medications  Medication Sig Dispense Refill  . albuterol (PROVENTIL HFA;VENTOLIN HFA) 108 (90 Base) MCG/ACT inhaler Inhale 2 puffs into the lungs every 3 (three) hours as needed for wheezing (cough, shortness of breath or wheezing.). 1 Inhaler 3  . atenolol-chlorthalidone (TENORETIC) 100-25 MG tablet Take 0.5 tablets by mouth daily. 45 tablet 1  . lisinopril (PRINIVIL,ZESTRIL) 20 MG tablet TAKE 1 TABLET BY MOUTH EVERY DAY 90 tablet 1  . simvastatin (ZOCOR) 20 MG tablet TAKE 1 TABLET BY MOUTH EVERY DAY 90 tablet 0  .  guaiFENesin-codeine (ROBITUSSIN AC) 100-10 MG/5ML syrup Take 5-10 mLs by mouth 4 (four) times daily as needed for cough. 180 mL 0  . predniSONE (DELTASONE) 20 MG tablet Take 1 tablet (20 mg total) by mouth 2 (two) times daily with a meal. 10 tablet 0   No current facility-administered medications for this visit.     No Known Allergies    Review of Systems:  Constitutional:  No  fever, no chills, +recent illness, No unintentional weight changes. +significant fatigue.   HEENT: No  headache, no vision change, no hearing change, +sore throat, +sinus pressure  Cardiac: No  chest pain, No  pressure, No palpitations  Respiratory:  No  shortness of breath. +Cough  Gastrointestinal: No  abdominal pain, No  nausea,  Musculoskeletal: No new myalgia/arthralgia  Skin: No  Rash  Neurologic: No  weakness, No  dizziness  Exam:  BP 122/82 (BP Location: Left Arm, Patient Position: Sitting, Cuff Size: Large)   Pulse 96   Temp 98.1 F (36.7 C) (Oral)   Resp 18   Ht 5\' 1"  (1.549 m)   Wt 252 lb 6.4 oz (114.5 kg)   SpO2 95%   BMI 47.69 kg/m   Constitutional: VS see above. General Appearance: alert, well-developed, well-nourished, NAD  Eyes: Normal lids and conjunctive, non-icteric sclera  Ears, Nose, Mouth, Throat: MMM, Normal external inspection ears/nares/mouth/lips/gums. TM normal bilaterally. Pharynx/tonsils +erythema, no exudate. Nasal mucosa congested.   Neck: No masses, trachea midline. No tenderness/mass appreciated. No  lymphadenopathy  Respiratory: Normal respiratory effort. +diffuse wheeze, no rhonchi, no rales  Cardiovascular: S1/S2 normal, no murmur, no rub/gallop auscultated. RRR. No lower extremity edema.   Musculoskeletal: Gait normal.   Neurological: Normal balance/coordination. No tremor.   Skin: warm, dry, intact.   Psychiatric: Normal judgment/insight. Normal mood and affect.   Dg Chest 2 View  Result Date: 12/22/2018 CLINICAL DATA:  Acute cough EXAM: CHEST -  2 VIEW COMPARISON:  07/28/2017 and prior radiograph FINDINGS: The cardiomediastinal silhouette is unremarkable. Mild peribronchial thickening is unchanged. There is no evidence of focal airspace disease, pulmonary edema, suspicious pulmonary nodule/mass, pleural effusion, or pneumothorax. No acute bony abnormalities are identified. IMPRESSION: No evidence of acute cardiopulmonary disease. Electronically Signed   By: Margarette Canada M.D.   On: 12/22/2018 09:14       ASSESSMENT/PLAN:   Mild intermittent asthma with acute exacerbation - Plan: DG Chest 2 View, ipratropium-albuterol (DUONEB) 0.5-2.5 (3) MG/3ML nebulizer solution 3 mL  Cough - Plan: DG Chest 2 View, ipratropium-albuterol (DUONEB) 0.5-2.5 (3) MG/3ML nebulizer solution 3 mL  Mild intermittent asthma without complication - Plan: albuterol (PROVENTIL HFA;VENTOLIN HFA) 108 (90 Base) MCG/ACT inhaler    Patient Instructions   Medications & Home Remedies for Upper Respiratory Illness   Aches/Pains, Fever, Headache OTC Acetaminophen (Tylenol) OTC Ibuprofen (Motrin)    Sinus Congestion Prescription Atrovent as directed OTC Nasal Saline if desired to rinse OTC Phenylephrine (Sudafed)  OTC Diphenhydramine (Benadryl)    Cough & Sore Throat Prescription cough pills or syrups as directed OTC Dextromethorphan OTC Lozenges w/ Benzocaine + Menthol (Cepacol) Honey - as much as you want! Teas which "coat the throat" - look for ingredients Elm Bark, Licorice Root, Marshmallow Root   Other Prescription Oral Steroids to decrease inflammation and help asthma          If you have lab work done today you will be contacted with your lab results within the next 2 weeks.  If you have not heard from [place] then please contact us. The fastest way to get your results is to register for My Chart.   IF you received an x-ray today, you will receive an invoice from [facility]. Please contact [facility] at [phone] with  questions or concerns regarding your invoice.   IF you received labwork today, you will receive an invoice from [facility]. Please contact [facility] at [phone] with questions or concerns regarding your invoice.   Our billing staff will not be able to assist you with questions regarding bills from these companies.  You will be contacted with the lab results as soon as they are available. The fastest way to get your results is to activate your My Chart account. Instructions are located on the last page of this paperwork. If you have not heard from [place] regarding the results in 2 weeks, please contact this office.         Visit summary with medication list and pertinent instructions was printed for patient to review. All questions at time of visit were answered - patient instructed to contact office with any additional concerns. ER/RTC precautions were reviewed with the patient.   Follow-up plan: Return if symptoms worsen or fail to improve.  Please note: voice recognition software was used to produce this document, and typos may escape review. Please contact Dr. Sheppard Coil for any needed clarifications.

## 2018-12-22 NOTE — Patient Instructions (Addendum)
Medications & Home Remedies for Upper Respiratory Illness   Aches/Pains, Fever, Headache OTC Acetaminophen (Tylenol) OTC Ibuprofen (Motrin)    Sinus Congestion Prescription Atrovent as directed OTC Nasal Saline if desired to rinse OTC Phenylephrine (Sudafed)  OTC Diphenhydramine (Benadryl)    Cough & Sore Throat Prescription cough pills or syrups as directed OTC Dextromethorphan OTC Lozenges w/ Benzocaine + Menthol (Cepacol) Honey - as much as you want! Teas which "coat the throat" - look for ingredients Elm Bark, Licorice Root, Marshmallow Root   Other Prescription Oral Steroids to decrease inflammation and help asthma          If you have lab work done today you will be contacted with your lab results within the next 2 weeks.  If you have not heard from [place] then please contact us. The fastest way to get your results is to register for My Chart.   IF you received an x-ray today, you will receive an invoice from [facility]. Please contact [facility] at [phone] with questions or concerns regarding your invoice.   IF you received labwork today, you will receive an invoice from [place]. Please contact [facility] at [phone] with questions or concerns regarding your invoice.   Our billing staff will not be able to assist you with questions regarding bills from these companies.  You will be contacted with the lab results as soon as they are available. The fastest way to get your results is to activate your My Chart account. Instructions are located on the last page of this paperwork. If you have not heard from [place] regarding the results in 2 weeks, please contact this office.

## 2019-02-25 ENCOUNTER — Other Ambulatory Visit: Payer: Self-pay | Admitting: Family Medicine

## 2019-02-25 DIAGNOSIS — E785 Hyperlipidemia, unspecified: Secondary | ICD-10-CM

## 2019-03-01 ENCOUNTER — Other Ambulatory Visit: Payer: Self-pay | Admitting: Family Medicine

## 2019-03-01 DIAGNOSIS — I10 Essential (primary) hypertension: Secondary | ICD-10-CM

## 2019-05-02 ENCOUNTER — Other Ambulatory Visit: Payer: Self-pay | Admitting: Family Medicine

## 2019-05-02 DIAGNOSIS — E785 Hyperlipidemia, unspecified: Secondary | ICD-10-CM

## 2019-05-02 NOTE — Telephone Encounter (Signed)
Forwarding medication refill to PCP for review. 

## 2019-07-22 ENCOUNTER — Telehealth: Payer: Self-pay | Admitting: General Practice

## 2019-07-22 NOTE — Telephone Encounter (Signed)
Routing to Provider, do not see where we are listed as PCP.

## 2019-07-22 NOTE — Telephone Encounter (Signed)
Medication Refill - Medication:  benzonatate  Has the patient contacted their pharmacy? yes (Agent: If no, request that the patient contact the pharmacy for the refill.) (Agent: If yes, when and what did the pharmacy advise?)  Preferred Pharmacy (with phone number or street name):  [id number] [address], [place] - [place] [phone] (Phone) [phone] (Fax)   Agent: Please be advised that RX refills may take up to 3 business days. We ask that you follow-up with your pharmacy.

## 2019-07-22 NOTE — Telephone Encounter (Signed)
Benzonatate medication not on current list. Please advise

## 2019-07-23 NOTE — Telephone Encounter (Signed)
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 I am NOT the PCP. Needs visit if concerning symptoms.

## 2019-07-23 NOTE — Telephone Encounter (Signed)
Called pt, no answer and unable to receive voicemails

## 2019-07-24 ENCOUNTER — Other Ambulatory Visit: Payer: Self-pay | Admitting: Family Medicine

## 2019-07-24 NOTE — Telephone Encounter (Signed)
Refill request benzonatate 100 mg cap. Med not on med list and no provider listed.  Was last seen at PCP.

## 2019-07-25 ENCOUNTER — Ambulatory Visit: Payer: 59 | Admitting: Emergency Medicine

## 2019-07-25 ENCOUNTER — Telehealth: Payer: Self-pay | Admitting: *Deleted

## 2019-07-25 ENCOUNTER — Encounter: Payer: Self-pay | Admitting: Emergency Medicine

## 2019-07-25 ENCOUNTER — Other Ambulatory Visit: Payer: Self-pay

## 2019-07-25 VITALS — BP 136/80 | HR 76 | Temp 98.4°F | Ht 65.0 in | Wt 255.0 lb

## 2019-07-25 DIAGNOSIS — R05 Cough: Secondary | ICD-10-CM

## 2019-07-25 DIAGNOSIS — R059 Cough, unspecified: Secondary | ICD-10-CM

## 2019-07-25 DIAGNOSIS — J4541 Moderate persistent asthma with (acute) exacerbation: Secondary | ICD-10-CM | POA: Diagnosis not present

## 2019-07-25 MED ORDER — IPRATROPIUM BROMIDE 0.02 % IN SOLN
0.5000 mg | Freq: Once | RESPIRATORY_TRACT | Status: AC
  Administered 2019-07-25: 17:00:00 0.5 mg via RESPIRATORY_TRACT

## 2019-07-25 MED ORDER — PREDNISONE 20 MG PO TABS: 40.0000 mg | ORAL_TABLET | Freq: Every day | ORAL | 0 refills | Status: AC

## 2019-07-25 MED ORDER — AZITHROMYCIN 250 MG PO TABS: ORAL_TABLET | ORAL | 0 refills | Status: DC

## 2019-07-25 MED ORDER — BENZONATATE 200 MG PO CAPS: 200.0000 mg | ORAL_CAPSULE | Freq: Two times a day (BID) | ORAL | 0 refills | Status: DC | PRN

## 2019-07-25 MED ORDER — ALBUTEROL SULFATE (2.5 MG/3ML) 0.083% IN NEBU
2.5000 mg | INHALATION_SOLUTION | Freq: Once | RESPIRATORY_TRACT | Status: AC
  Administered 2019-07-25: 17:00:00 2.5 mg via RESPIRATORY_TRACT

## 2019-07-25 NOTE — Telephone Encounter (Signed)
Called patient unable to leave message, mail box is full. Patient needs a medication for constipation. I spoke to Dr Mitchel Honour and he recommended an over the counter medication.

## 2019-07-25 NOTE — Patient Instructions (Addendum)
   If you have lab work done today you will be contacted with your lab results within the next 2 weeks.  If you have not heard from us then please contact us. The fastest way to get your results is to register for My Chart.   IF you received an x-ray today, you will receive an invoice from [facility]. Please contact [facility] at [phone] with questions or concerns regarding your invoice.   IF you received labwork today, you will receive an invoice from [facility]. Please contact [facility] at [phone] with questions or concerns regarding your invoice.   Our billing staff will not be able to assist you with questions regarding bills from these companies.  You will be contacted with the lab results as soon as they are available. The fastest way to get your results is to activate your My Chart account. Instructions are located on the last page of this paperwork. If you have not heard from us regarding the results in 2 weeks, please contact this office.     Asthma, Adult  Asthma is a long-term (chronic) condition in which the airways get tight and narrow. The airways are the breathing passages that lead from the nose and mouth down into the lungs. A person with asthma will have times when symptoms get worse. These are called asthma attacks. They can cause coughing, whistling sounds when you breathe (wheezing), shortness of breath, and chest pain. They can make it hard to breathe. There is no cure for asthma, but medicines and lifestyle changes can help control it. There are many things that can bring on an asthma attack or make asthma symptoms worse (triggers). Common triggers include:  Mold.  Dust.  Cigarette smoke.  Cockroaches.  Things that can cause allergy symptoms (allergens). These include animal skin flakes (dander) and pollen from trees or grass.  Things that pollute the air. These may include household cleaners, wood smoke, smog, or chemical  odors.  Cold air, weather changes, and wind.  Crying or laughing hard.  Stress.  Certain medicines or drugs.  Certain foods such as dried fruit, potato chips, and grape juice.  Infections, such as a cold or the flu.  Certain medical conditions or diseases.  Exercise or tiring activities. Asthma may be treated with medicines and by staying away from the things that cause asthma attacks. Types of medicines may include:  Controller medicines. These help prevent asthma symptoms. They are usually taken every day.  Fast-acting reliever or rescue medicines. These quickly relieve asthma symptoms. They are used as needed and provide short-term relief.  Allergy medicines if your attacks are brought on by allergens.  Medicines to help control the body's defense (immune) system. Follow these instructions at home: Avoiding triggers in your home  Change your heating and air conditioning filter often.  Limit your use of fireplaces and wood stoves.  Get rid of pests (such as roaches and mice) and their droppings.  Throw away plants if you see mold on them.  Clean your floors. Dust regularly. Use cleaning products that do not smell.  Have someone vacuum when you are not home. Use a vacuum cleaner with a HEPA filter if possible.  Replace carpet with wood, tile, or vinyl flooring. Carpet can trap animal skin flakes and dust.  Use allergy-proof pillows, mattress covers, and box spring covers.  Wash bed sheets and blankets every week in hot water. Dry them in a dryer.  Keep your bedroom free of any triggers.    Avoid pets and keep windows closed when things that cause allergy symptoms are in the air.  Use blankets that are made of polyester or cotton.  Clean bathrooms and kitchens with bleach. If possible, have someone repaint the walls in these rooms with mold-resistant paint. Keep out of the rooms that are being cleaned and painted.  Wash your hands often with soap and water. If  soap and water are not available, use hand sanitizer.  Do not allow anyone to smoke in your home. General instructions  Take over-the-counter and prescription medicines only as told by your doctor. ? Talk with your doctor if you have questions about how or when to take your medicines. ? Make note if you need to use your medicines more often than usual.  Do not use any products that contain nicotine or tobacco, such as cigarettes and e-cigarettes. If you need help quitting, ask your doctor.  Stay away from secondhand smoke.  Avoid doing things outdoors when allergen counts are high and when air quality is low.  Wear a ski mask when doing outdoor activities in the winter. The mask should cover your nose and mouth. Exercise indoors on cold days if you can.  Warm up before you exercise. Take time to cool down after exercise.  Use a peak flow meter as told by your doctor. A peak flow meter is a tool that measures how well the lungs are working.  Keep track of the peak flow meter's readings. Write them down.  Follow your asthma action plan. This is a written plan for taking care of your asthma and treating your attacks.  Make sure you get all the shots (vaccines) that your doctor recommends. Ask your doctor about a flu shot and a pneumonia shot.  Keep all follow-up visits as told by your doctor. This is important. Contact a doctor if:  You have wheezing, shortness of breath, or a cough even while taking medicine to prevent attacks.  The mucus you cough up (sputum) is thicker than usual.  The mucus you cough up changes from clear or white to yellow, green, gray, or bloody.  You have problems from the medicine you are taking, such as: ? A rash. ? Itching. ? Swelling. ? Trouble breathing.  You need reliever medicines more than 2-3 times a week.  Your peak flow reading is still at 50-79% of your personal best after following the action plan for 1 hour.  You have a fever. Get help  right away if:  You seem to be worse and are not responding to medicine during an asthma attack.  You are short of breath even at rest.  You get short of breath when doing very little activity.  You have trouble eating, drinking, or talking.  You have chest pain or tightness.  You have a fast heartbeat.  Your lips or fingernails start to turn blue.  You are light-headed or dizzy, or you faint.  Your peak flow is less than 50% of your personal best.  You feel too tired to breathe normally. Summary  Asthma is a long-term (chronic) condition in which the airways get tight and narrow. An asthma attack can make it hard to breathe.  Asthma cannot be cured, but medicines and lifestyle changes can help control it.  Make sure you understand how to avoid triggers and how and when to use your medicines. This information is not intended to replace advice given to you by your health care provider. Make sure you discuss   any questions you have with your health care provider. Document Released: 03/14/2008 Document Revised: 11/29/2018 Document Reviewed: 10/31/2016 Elsevier Patient Education  2020 Elsevier Inc.  

## 2019-07-25 NOTE — Progress Notes (Signed)
BP Readings from Last 3 Encounters:  12/22/18 122/82  10/22/18 126/82  02/01/18 122/82   Lab Results  Component Value Date   CHOL 248 (H) 02/01/2018   HDL 59 02/01/2018   LDLCALC 165 (H) 02/01/2018   LDLDIRECT 162 (H) 01/25/2014   TRIG 120 02/01/2018   CHOLHDL 4.2 02/01/2018   Lab Results  Component Value Date   CREATININE 0.66 02/01/2018   BUN 9 02/01/2018   NA 137 02/01/2018   K 4.4 02/01/2018   CL 99 02/01/2018   CO2 24 02/01/2018   Veronica Velazquez 53 y.o.   Chief Complaint  Patient presents with   Asthma    x 3 weeks - dry cough, Shortness of breath     HISTORY OF PRESENT ILLNESS: This is a 53 y.o. female with history of asthma complaining of shortness of breath and wheezing for the past 2 to 3 weeks.  Dry cough.  Denies fever or chills.  Denies flulike symptoms.  No other significant symptoms. Past medical history significant for hypertension.  HPI   Prior to Admission medications   Medication Sig Start Date End Date Taking? Authorizing Provider  albuterol (PROVENTIL HFA;VENTOLIN HFA) 108 (90 Base) MCG/ACT inhaler Inhale 2 puffs into the lungs every 3 (three) hours as needed for wheezing (cough, shortness of breath or wheezing.). 12/22/18  Yes Veronica Reeve, DO  atenolol-chlorthalidone (TENORETIC) 100-25 MG tablet TAKE 1/2 TABLETS BY MOUTH DAILY. 03/01/19  Yes Veronica Guys, MD  guaiFENesin-codeine [facility]) 100-10 MG/5ML syrup Take 5-10 mLs by mouth 4 (four) times daily as needed for cough. 12/22/18  Yes Veronica Velazquez, Veronica Bal, DO  lisinopril (ZESTRIL) 20 MG tablet TAKE 1 TABLET BY MOUTH EVERY DAY 03/01/19  Yes Veronica Guys, MD  predniSONE (DELTASONE) 20 MG tablet Take 1 tablet (20 mg total) by mouth 2 (two) times daily with a meal. 12/22/18  Yes Veronica Reeve, DO  simvastatin (ZOCOR) 20 MG tablet Take 1 tablet (20 mg total) by mouth daily. Needs office visit for further refills 05/03/19  Yes Veronica Guys, MD    No Known Allergies  Patient  Active Problem List   Diagnosis Date Noted   Asthma 09/12/2012   HTN (hypertension) 09/12/2012   Hypothyroidism    Obesity     Past Medical History:  Diagnosis Date   Asthma    Depression    Hyperglycemia    Hypertension    Hypothyroidism    Microcytosis    Obesity    Prediabetes     Past Surgical History:  Procedure Laterality Date   ABDOMINAL HYSTERECTOMY      Social History   Socioeconomic History   Marital status: Married    Spouse name: Veronica Velazquez   Number of children: 3   Years of education: 7   Highest education level: Not on file  Occupational History   Occupation: homemaker  Social Designer, fashion/clothing strain: Not on file   Food insecurity    Worry: Not on file    Inability: Not on file   Transportation needs    Medical: Not on file    Non-medical: Not on file  Tobacco Use   Smoking status: Never Smoker   Smokeless tobacco: Never Used  Substance and Sexual Activity   Alcohol use: No   Drug use: No   Sexual activity: Yes    Partners: Male  Lifestyle   Physical activity    Days per week: Not on file    Minutes per session:  Not on file   Stress: Not on file  Relationships   Social connections    Talks on phone: Not on file    Gets together: Not on file    Attends religious service: Not on file    Active member of club or organization: Not on file    Attends meetings of clubs or organizations: Not on file    Relationship status: Not on file   Intimate partner violence    Fear of current or ex partner: Not on file    Emotionally abused: Not on file    Physically abused: Not on file    Forced sexual activity: Not on file  Other Topics Concern   Not on file  Social History Narrative   Lives with her husband and two daughters.  They care for their 55 year-old granddaughter during the day while her mother (their daughter) works.    Family History  Problem Relation Age of Onset   Diabetes Brother         drug-induced     Review of Systems  Constitutional: Negative.  Negative for chills and fever.  HENT: Negative.  Negative for congestion and sore throat.   Eyes: Negative for blurred vision and double vision.  Respiratory: Positive for cough, shortness of breath and wheezing. Negative for hemoptysis and sputum production.   Cardiovascular: Negative.  Negative for chest pain and palpitations.  Gastrointestinal: Negative.  Negative for abdominal pain, diarrhea, nausea and vomiting.  Genitourinary: Negative.  Negative for dysuria.  Musculoskeletal: Negative.  Negative for myalgias.  Skin: Negative.  Negative for rash.  Neurological: Negative.  Negative for dizziness and headaches.  Endo/Heme/Allergies: Negative.   All other systems reviewed and are negative.  Vitals:   07/25/19 1615  BP: 136/80  Pulse: 76  Temp: 98.4 F (36.9 C)    Physical Exam Vitals signs reviewed.  Constitutional:      Appearance: Normal appearance. She is obese.  HENT:     Head: Normocephalic.  Eyes:     Extraocular Movements: Extraocular movements intact.     Conjunctiva/sclera: Conjunctivae normal.     Pupils: Pupils are equal, round, and reactive to light.  Neck:     Musculoskeletal: Normal range of motion and neck supple.  Cardiovascular:     Rate and Rhythm: Normal rate and regular rhythm.     Pulses: Normal pulses.     Heart sounds: Normal heart sounds.  Pulmonary:     Effort: Pulmonary effort is normal.     Breath sounds: Wheezing (Diffuse and bilateral) present.  Musculoskeletal: Normal range of motion.  Skin:    General: Skin is warm and dry.  Neurological:     General: No focal deficit present.     Mental Status: She is alert and oriented to person, place, and time.  Psychiatric:        Mood and Affect: Mood normal.        Behavior: Behavior normal.      ASSESSMENT & PLAN: [name] was seen today for asthma.  Diagnoses and all orders for this visit:  Moderate persistent asthma  with acute exacerbation -     albuterol (PROVENTIL) (2.5 MG/3ML) 0.083% nebulizer solution 2.5 mg -     ipratropium (ATROVENT) nebulizer solution 0.5 mg -     azithromycin (ZITHROMAX) 250 MG tablet; Sig as indicated -     predniSONE (DELTASONE) 20 MG tablet; Take 2 tablets (40 mg total) by mouth daily with breakfast for 5 days.  Cough -     benzonatate (TESSALON) 200 MG capsule; Take 1 capsule (200 mg total) by mouth 2 (two) times daily as needed for cough.    Patient Instructions       If you have lab work done today you will be contacted with your lab results within the next 2 weeks.  If you have not heard from [place] then please contact us. The fastest way to get your results is to register for My Chart.   IF you received an x-ray today, you will receive an invoice from [facility]. Please contact [facility] at [phone] with questions or concerns regarding your invoice.   IF you received labwork today, you will receive an invoice from [facility]. Please contact [facility] at [phone] with questions or concerns regarding your invoice.   Our billing staff will not be able to assist you with questions regarding bills from these companies.  You will be contacted with the lab results as soon as they are available. The fastest way to get your results is to activate your My Chart account. Instructions are located on the last page of this paperwork. If you have not heard from [place] regarding the results in 2 weeks, please contact this office.     Asthma, Adult  Asthma is a long-term (chronic) condition in which the airways get tight and narrow. The airways are the breathing passages that lead from the nose and mouth down into the lungs. A person with asthma will have times when symptoms get worse. These are called asthma attacks. They can cause coughing, whistling sounds when you breathe (wheezing), shortness of breath, and chest pain. They can make it hard to breathe.  There is no cure for asthma, but medicines and lifestyle changes can help control it. There are many things that can bring on an asthma attack or make asthma symptoms worse (triggers). Common triggers include:  Mold.  Dust.  Cigarette smoke.  Cockroaches.  Things that can cause allergy symptoms (allergens). These include animal skin flakes (dander) and pollen from trees or grass.  Things that pollute the air. These may include household cleaners, wood smoke, smog, or chemical odors.  Cold air, weather changes, and wind.  Crying or laughing hard.  Stress.  Certain medicines or drugs.  Certain foods such as dried fruit, potato chips, and grape juice.  Infections, such as a cold or the flu.  Certain medical conditions or diseases.  Exercise or tiring activities. Asthma may be treated with medicines and by staying away from the things that cause asthma attacks. Types of medicines may include:  Controller medicines. These help prevent asthma symptoms. They are usually taken every day.  Fast-acting reliever or rescue medicines. These quickly relieve asthma symptoms. They are used as needed and provide short-term relief.  Allergy medicines if your attacks are brought on by allergens.  Medicines to help control the body's defense (immune) system. Follow these instructions at home: Avoiding triggers in your home  Change your heating and air conditioning filter often.  Limit your use of fireplaces and wood stoves.  Get rid of pests (such as roaches and mice) and their droppings.  Throw away plants if you see mold on them.  Clean your floors. Dust regularly. Use cleaning products that do not smell.  Have someone vacuum when you are not home. Use a vacuum cleaner with a HEPA filter if possible.  Replace carpet with wood, tile, or vinyl flooring. Carpet can trap animal skin flakes and dust.  Use allergy-proof pillows, mattress covers, and box spring covers.  Wash bed  sheets and blankets every week in hot water. Dry them in a dryer.  Keep your bedroom free of any triggers.  Avoid pets and keep windows closed when things that cause allergy symptoms are in the air.  Use blankets that are made of polyester or cotton.  Clean bathrooms and kitchens with bleach. If possible, have someone repaint the walls in these rooms with mold-resistant paint. Keep out of the rooms that are being cleaned and painted.  Wash your hands often with soap and water. If soap and water are not available, use hand sanitizer.  Do not allow anyone to smoke in your home. General instructions  Take over-the-counter and prescription medicines only as told by your doctor. ? Talk with your doctor if you have questions about how or when to take your medicines. ? Make note if you need to use your medicines more often than usual.  Do not use any products that contain nicotine or tobacco, such as cigarettes and e-cigarettes. If you need help quitting, ask your doctor.  Stay away from secondhand smoke.  Avoid doing things outdoors when allergen counts are high and when air quality is low.  Wear a ski mask when doing outdoor activities in the winter. The mask should cover your nose and mouth. Exercise indoors on cold days if you can.  Warm up before you exercise. Take time to cool down after exercise.  Use a peak flow meter as told by your doctor. A peak flow meter is a tool that measures how well the lungs are working.  Keep track of the peak flow meter's readings. Write them down.  Follow your asthma action plan. This is a written plan for taking care of your asthma and treating your attacks.  Make sure you get all the shots (vaccines) that your doctor recommends. Ask your doctor about a flu shot and a pneumonia shot.  Keep all follow-up visits as told by your doctor. This is important. Contact a doctor if:  You have wheezing, shortness of breath, or a cough even while taking  medicine to prevent attacks.  The mucus you cough up (sputum) is thicker than usual.  The mucus you cough up changes from clear or white to yellow, green, gray, or bloody.  You have problems from the medicine you are taking, such as: ? A rash. ? Itching. ? Swelling. ? Trouble breathing.  You need reliever medicines more than 2-3 times a week.  Your peak flow reading is still at 50-79% of your personal best after following the action plan for 1 hour.  You have a fever. Get help right away if:  You seem to be worse and are not responding to medicine during an asthma attack.  You are short of breath even at rest.  You get short of breath when doing very little activity.  You have trouble eating, drinking, or talking.  You have chest pain or tightness.  You have a fast heartbeat.  Your lips or fingernails start to turn blue.  You are light-headed or dizzy, or you faint.  Your peak flow is less than 50% of your personal best.  You feel too tired to breathe normally. Summary  Asthma is a long-term (chronic) condition in which the airways get tight and narrow. An asthma attack can make it hard to breathe.  Asthma cannot be cured, but medicines and lifestyle changes can help control it.  Make sure  you understand how to avoid triggers and how and when to use your medicines. This information is not intended to replace advice given to you by your health care provider. Make sure you discuss any questions you have with your health care provider. Document Released: 03/14/2008 Document Revised: 11/29/2018 Document Reviewed: 10/31/2016 Elsevier Patient Education  2020 Elsevier Inc.      Agustina Caroli, MD [facility]

## 2019-07-30 ENCOUNTER — Telehealth: Payer: Self-pay | Admitting: *Deleted

## 2019-07-30 NOTE — Telephone Encounter (Signed)
Called patient at the number [phone] Rx request ([facility]), I checked HIPPA form this is patient daughter's number. No answer, I left message in voice mail the patient can buy Miralax over the counter for constipation.

## 2019-08-04 ENCOUNTER — Other Ambulatory Visit: Payer: Self-pay | Admitting: Family Medicine

## 2019-08-04 DIAGNOSIS — I10 Essential (primary) hypertension: Secondary | ICD-10-CM

## 2019-08-04 DIAGNOSIS — E785 Hyperlipidemia, unspecified: Secondary | ICD-10-CM

## 2019-08-05 NOTE — Telephone Encounter (Signed)
Requested medication (s) are due for refill today: yes  Requested medication (s) are on the active medication list: yes  Last refill:  05/02/2019  Future visit scheduled: no  Notes to clinic:  Overdue for follow up Review for refill   Requested Prescriptions  Pending Prescriptions Disp Refills   lisinopril (ZESTRIL) 20 MG tablet [Pharmacy Med Name: LISINOPRIL 20 MG TABLET] 90 tablet 0    Sig: TAKE 1 TABLET BY MOUTH EVERY DAY     Cardiovascular:  ACE Inhibitors Failed - 08/04/2019  9:39 AM      Failed - Cr in normal range and within 180 days    Creat  Date Value Ref Range Status  07/27/2016 0.61 0.50 - 1.05 mg/dL Final    Comment:      For patients > or = 55 years of age: The upper reference limit for Creatinine is approximately 13% higher for people identified as African-American.      Creatinine, Ser  Date Value Ref Range Status  02/01/2018 0.66 0.57 - 1.00 mg/dL Final         Failed - K in normal range and within 180 days    Potassium  Date Value Ref Range Status  02/01/2018 4.4 3.5 - 5.2 mmol/L Final         Passed - Patient is not pregnant      Passed - Last BP in normal range    BP Readings from Last 1 Encounters:  07/25/19 136/80         Passed - Valid encounter within last 6 months    Recent Outpatient Visits          1 week ago Moderate persistent asthma with acute exacerbation   [facility] [place], [place], MD   7 months ago Mild intermittent asthma with acute exacerbation   [facility] [place], [place], DO   9 months ago Influenza A   [facility], Ines [place], MD   1 year ago Wheezing   [facility], Lilia [place], MD   2 years ago Essential hypertension, benign   [facility], Lilia Argue, MD              simvastatin (ZOCOR) 20 MG tablet [Pharmacy Med Name: SIMVASTATIN 20 MG TABLET] 90 tablet 0    Sig: Take 1 tablet (20 mg total) by mouth daily. Needs office visit  for further refills     Cardiovascular:  Antilipid - Statins Failed - 08/04/2019  9:39 AM      Failed - Total Cholesterol in normal range and within 360 days    Cholesterol, Total  Date Value Ref Range Status  02/01/2018 248 (H) 100 - 199 mg/dL Final         Failed - LDL in normal range and within 360 days    LDL Calculated  Date Value Ref Range Status  02/01/2018 165 (H) 0 - 99 mg/dL Final         Failed - HDL in normal range and within 360 days    HDL  Date Value Ref Range Status  02/01/2018 59 >39 mg/dL Final         Failed - Triglycerides in normal range and within 360 days    Triglycerides  Date Value Ref Range Status  02/01/2018 120 0 - 149 mg/dL Final         Passed - Patient is not pregnant  Passed - Valid encounter within last 12 months    Recent Outpatient Visits          1 week ago Moderate persistent asthma with acute exacerbation   [facility] at [facility], [place], MD   7 months ago Mild intermittent asthma with acute exacerbation   [facility] at [place], [place], DO   9 months ago Influenza A   [facility] at [facility], [place], MD   1 year ago Wheezing   [facility], Lilia Argue, MD   2 years ago Essential hypertension, benign   [facility], Lilia Argue, MD              atenolol-chlorthalidone (TENORETIC) 100-25 MG tablet [Pharmacy Med Name: ATENOLOL-CHLORTHAL 100-25 TB] 45 tablet 0    Sig: TAKE 1/2 TABLET BY MOUTH DAILY     Cardiovascular: Beta Blocker + Diuretic Combos Failed - 08/04/2019  9:39 AM      Failed - K in normal range and within 180 days    Potassium  Date Value Ref Range Status  02/01/2018 4.4 3.5 - 5.2 mmol/L Final         Failed - Na in normal range and within 180 days    Sodium  Date Value Ref Range Status  02/01/2018 137 134 - 144 mmol/L Final         Failed - Cr in normal range and within 180 days    Creat  Date Value Ref Range Status  07/27/2016  0.61 0.50 - 1.05 mg/dL Final    Comment:      For patients > or = 55 years of age: The upper reference limit for Creatinine is approximately 13% higher for people identified as African-American.      Creatinine, Ser  Date Value Ref Range Status  02/01/2018 0.66 0.57 - 1.00 mg/dL Final         Failed - Ca in normal range and within 180 days    Calcium  Date Value Ref Range Status  02/01/2018 9.8 8.7 - 10.2 mg/dL Final         Passed - Patient is not pregnant      Passed - Last BP in normal range    BP Readings from Last 1 Encounters:  07/25/19 136/80         Passed - Last Heart Rate in normal range    Pulse Readings from Last 1 Encounters:  07/25/19 76         Passed - Valid encounter within last 6 months    Recent Outpatient Visits          1 week ago Moderate persistent asthma with acute exacerbation   [facility] at [facility], [place], [place]   7 months ago Mild intermittent asthma with acute exacerbation   [facility], [place], DO   9 months ago Influenza A   [facility] at [facility], Ines Bloom[place], MD   1 year ago Wheezing   [facility], Lilia Argue, MD   2 years ago Essential hypertension, benign   [facility], Lilia Argue, MD

## 2019-08-08 ENCOUNTER — Telehealth: Payer: Self-pay | Admitting: Emergency Medicine

## 2019-08-08 NOTE — Telephone Encounter (Signed)
Pt states she was supposed to receive a referral for a mammogram. Please advise.

## 2019-08-09 ENCOUNTER — Ambulatory Visit: Payer: 59 | Admitting: Adult Health Nurse Practitioner

## 2019-08-09 ENCOUNTER — Encounter: Payer: Self-pay | Admitting: Adult Health Nurse Practitioner

## 2019-08-09 ENCOUNTER — Other Ambulatory Visit: Payer: Self-pay

## 2019-08-09 DIAGNOSIS — M62838 Other muscle spasm: Secondary | ICD-10-CM

## 2019-08-09 HISTORY — DX: Other muscle spasm: M62.838

## 2019-08-09 MED ORDER — METHOCARBAMOL 500 MG PO TABS: 500.0000 mg | ORAL_TABLET | Freq: Four times a day (QID) | ORAL | 0 refills | Status: DC

## 2019-08-09 MED ORDER — IBUPROFEN 800 MG PO TABS: 800.0000 mg | ORAL_TABLET | Freq: Three times a day (TID) | ORAL | 0 refills | Status: DC | PRN

## 2019-08-09 NOTE — Progress Notes (Signed)
.   Acute Office Visit  Subjective:    Patient ID: Veronica Velazquez, female    DOB: [birth year], 53 y.o.   MRN: [id number]  Chief Complaint  Patient presents with  . Shoulder Pain    Pt stated left shoulder---sharp pain--3 days. denied fever.    HPI Patient is in today for  Left sided shoulder pain.  Interpreter service and husband were used for communication in [place].   Notes 3 days ago developed pain to the left shoulder which when she points to localize is actually in the left trapezius.  No difficulty with ROM of shoulder.  No focal tenderness of shoulder.  Feels like it radiates from left shoulder into trapezius.  No precipitating injury.  Just feels like she slept wrong.   In addition, although not noted in appt. Intake, she notes a lump to left breast last week and requests mammogram.    Past Medical History:  Diagnosis Date  . Asthma   . Depression   . Hyperglycemia   . Hypertension   . Hypothyroidism   . Microcytosis   . Obesity   . Prediabetes   . Trapezius muscle spasm 08/09/2019    Past Surgical History:  Procedure Laterality Date  . ABDOMINAL HYSTERECTOMY      Family History  Problem Relation Age of Onset  . Diabetes Brother        drug-induced    Social History   Socioeconomic History  . Marital status: Married    Spouse name: Ghulam  . Number of children: 3  . Years of education: 7  . Highest education level: Not on file  Occupational History  . Occupation: homemaker  Social Needs  . Financial resource strain: Not on file  . Food insecurity    Worry: Not on file    Inability: Not on file  . Transportation needs    Medical: Not on file    Non-medical: Not on file  Tobacco Use  . Smoking status: Never Smoker  . Smokeless tobacco: Never Used  Substance and Sexual Activity  . Alcohol use: No  . Drug use: No  . Sexual activity: Yes    Partners: Male  Lifestyle  . Physical activity    Days per week: Not on file    Minutes per session:  Not on file  . Stress: Not on file  Relationships  . Social Herbalist on phone: Not on file    Gets together: Not on file    Attends religious service: Not on file    Active member of club or organization: Not on file    Attends meetings of clubs or organizations: Not on file    Relationship status: Not on file  . Intimate partner violence    Fear of current or ex partner: Not on file    Emotionally abused: Not on file    Physically abused: Not on file    Forced sexual activity: Not on file  Other Topics Concern  . Not on file  Social History Narrative   Lives with her husband and two daughters.  They care for their 79 year-old granddaughter during the day while her mother (their daughter) works.    Outpatient Medications Prior to Visit  Medication Sig Dispense Refill  . albuterol (PROVENTIL HFA;VENTOLIN HFA) 108 (90 Base) MCG/ACT inhaler Inhale 2 puffs into the lungs every 3 (three) hours as needed for wheezing (cough, shortness of breath or wheezing.). 1 Inhaler 3  .  atenolol-chlorthalidone (TENORETIC) 100-25 MG tablet TAKE 1/2 TABLETS BY MOUTH DAILY. 45 tablet 0  . guaiFENesin-codeine (ROBITUSSIN AC) 100-10 MG/5ML syrup Take 5-10 mLs by mouth 4 (four) times daily as needed for cough. 180 mL 0  . lisinopril (ZESTRIL) 20 MG tablet TAKE 1 TABLET BY MOUTH EVERY DAY 90 tablet 0  . simvastatin (ZOCOR) 20 MG tablet Take 1 tablet (20 mg total) by mouth daily. Needs office visit for further refills 90 tablet 0  . benzonatate (TESSALON) 200 MG capsule Take 1 capsule (200 mg total) by mouth 2 (two) times daily as needed for cough. 20 capsule 0  . azithromycin (ZITHROMAX) 250 MG tablet Sig as indicated 6 tablet 0   No facility-administered medications prior to visit.     No Known Allergies  Review of Systems  Reason unable to perform ROS: Breast lump to left breast.  Constitutional: Negative.   Musculoskeletal: Positive for back pain and myalgias.  Neurological: Negative for  focal weakness.  All other systems reviewed and are negative. +Lump/mass to left breast      Objective:    Physical Exam  Constitutional: She is oriented to person, place, and time. She appears well-developed and well-nourished.  Musculoskeletal: Normal range of motion.        General: Tenderness present.     Comments: Tenderness to left trapezius muscle; focal.  ROM in shoulder completely normal with 5/5 left sided UE strength.   Neurological: She is alert and oriented to person, place, and time.  Nursing note and vitals reviewed.   BP 119/87   Pulse 70   Temp 98.6 F (37 C)   Ht 5\' 4"  (1.626 m)   Wt 255 lb (115.7 kg)   SpO2 94%   BMI 43.77 kg/m  Wt Readings from Last 3 Encounters:  08/09/19 255 lb (115.7 kg)  07/25/19 255 lb (115.7 kg)  12/22/18 252 lb 6.4 oz (114.5 kg)    Health Maintenance Due  Topic Date Due  . Hepatitis C Screening  1966/08/24  . FOOT EXAM  01/31/1974  . OPHTHALMOLOGY EXAM  01/31/1974  . HIV Screening  02/01/1979  . PAP SMEAR-Modifier  01/31/1985  . HEMOGLOBIN A1C  08/03/2018    There are no preventive care reminders to display for this patient.   Lab Results  Component Value Date   TSH 4.710 (H) 08/05/2017   Lab Results  Component Value Date   WBC 9.8 02/01/2018   HGB 13.7 02/01/2018   HCT 43.5 02/01/2018   MCV 75.8 (A) 02/01/2018   PLT 385 (H) 08/05/2017   Lab Results  Component Value Date   NA 137 02/01/2018   K 4.4 02/01/2018   CO2 24 02/01/2018   GLUCOSE 113 (H) 02/01/2018   BUN 9 02/01/2018   CREATININE 0.66 02/01/2018   BILITOT 0.4 02/01/2018   ALKPHOS 109 02/01/2018   AST 16 02/01/2018   ALT 13 02/01/2018   PROT 7.5 02/01/2018   ALBUMIN 4.4 02/01/2018   CALCIUM 9.8 02/01/2018   Lab Results  Component Value Date   CHOL 248 (H) 02/01/2018   Lab Results  Component Value Date   HDL 59 02/01/2018   Lab Results  Component Value Date   LDLCALC 165 (H) 02/01/2018   Lab Results  Component Value Date   TRIG 120  02/01/2018   Lab Results  Component Value Date   CHOLHDL 4.2 02/01/2018   Lab Results  Component Value Date   HGBA1C 6.7 (H) 02/01/2018  Assessment & Plan:   Problem List Items Addressed This Visit      Musculoskeletal and Integument   Trapezius muscle spasm     Recommended OTC Salonpas patch with Capsacin.   Meds ordered this encounter  Medications  . methocarbamol (ROBAXIN) 500 MG tablet    Sig: Take 1 tablet (500 mg total) by mouth 4 (four) times daily.    Dispense:  30 tablet    Refill:  0  . ibuprofen (ADVIL) 800 MG tablet    Sig: Take 1 tablet (800 mg total) by mouth every 8 (eight) hours as needed.    Dispense:  30 tablet    Refill:  0   Breast Lump:  Patient scheduled for CBE on Tuesday and at that point will be able to order diagnostic mammogram.     Glyn Ade, NP

## 2019-08-13 ENCOUNTER — Telehealth: Payer: Self-pay | Admitting: *Deleted

## 2019-08-13 ENCOUNTER — Other Ambulatory Visit: Payer: Self-pay

## 2019-08-13 ENCOUNTER — Other Ambulatory Visit: Payer: Self-pay | Admitting: *Deleted

## 2019-08-13 ENCOUNTER — Encounter: Payer: Self-pay | Admitting: Adult Health Nurse Practitioner

## 2019-08-13 ENCOUNTER — Ambulatory Visit: Payer: 59 | Admitting: Adult Health Nurse Practitioner

## 2019-08-13 VITALS — BP 149/88 | HR 63 | Temp 98.2°F | Resp 20

## 2019-08-13 DIAGNOSIS — Z0100 Encounter for examination of eyes and vision without abnormal findings: Secondary | ICD-10-CM

## 2019-08-13 DIAGNOSIS — Z23 Encounter for immunization: Secondary | ICD-10-CM

## 2019-08-13 DIAGNOSIS — R7989 Other specified abnormal findings of blood chemistry: Secondary | ICD-10-CM

## 2019-08-13 DIAGNOSIS — N6320 Unspecified lump in the left breast, unspecified quadrant: Secondary | ICD-10-CM | POA: Diagnosis not present

## 2019-08-13 DIAGNOSIS — Z1231 Encounter for screening mammogram for malignant neoplasm of breast: Secondary | ICD-10-CM

## 2019-08-13 DIAGNOSIS — R7303 Prediabetes: Secondary | ICD-10-CM

## 2019-08-13 DIAGNOSIS — M62838 Other muscle spasm: Secondary | ICD-10-CM

## 2019-08-13 DIAGNOSIS — Z13228 Encounter for screening for other metabolic disorders: Secondary | ICD-10-CM | POA: Diagnosis not present

## 2019-08-13 DIAGNOSIS — N63 Unspecified lump in unspecified breast: Secondary | ICD-10-CM

## 2019-08-13 DIAGNOSIS — E119 Type 2 diabetes mellitus without complications: Secondary | ICD-10-CM

## 2019-08-13 HISTORY — DX: Unspecified lump in the left breast, unspecified quadrant: N63.20

## 2019-08-13 LAB — POCT GLYCOSYLATED HEMOGLOBIN (HGB A1C): Hemoglobin A1C: 7.2 % — AB (ref 4.0–5.6)

## 2019-08-13 NOTE — Progress Notes (Signed)
Established Patient Office Visit  Subjective:  Patient ID: Veronica Velazquez, female    DOB: [birth year]  Age: 53 y.o. MRN: [id number]  CC:  Chief Complaint  Patient presents with   Muscle Pain    has been taking tylenol and massaging back.  doesn't hurt as much since recent OV.  has been able to perform housework.    Breast Pain    was also seen for lump in breast     HPI Veronica Velazquez presents for left breast mass.  I saw on Friday., complaining of a left breast lump.  Reports that over the past month, she had gotten out of shower and felt a lump to the left breast.  She has never had a mammogram and wishes to have this area evaluated.  It is non-tender, non-painful.  No hx of breast cancer in her family.   Recently, had a muscle spasm to the left trapezius which is improving.   Hx of hyperglycemia without recent labs.   Past Medical History:  Diagnosis Date   Asthma    Breast mass, left 08/13/2019   Depression    Hyperglycemia    Hypertension    Hypothyroidism    Microcytosis    Obesity    Prediabetes    Trapezius muscle spasm 08/09/2019    Past Surgical History:  Procedure Laterality Date   ABDOMINAL HYSTERECTOMY      Family History  Problem Relation Age of Onset   Diabetes Brother        drug-induced      Outpatient Medications Prior to Visit  Medication Sig Dispense Refill   albuterol (PROVENTIL HFA;VENTOLIN HFA) 108 (90 Base) MCG/ACT inhaler Inhale 2 puffs into the lungs every 3 (three) hours as needed for wheezing (cough, shortness of breath or wheezing.). 1 Inhaler 3   atenolol-chlorthalidone (TENORETIC) 100-25 MG tablet TAKE 1/2 TABLETS BY MOUTH DAILY. 45 tablet 0   guaiFENesin-codeine (ROBITUSSIN AC) 100-10 MG/5ML syrup Take 5-10 mLs by mouth 4 (four) times daily as needed for cough. 180 mL 0   ibuprofen (ADVIL) 800 MG tablet Take 1 tablet (800 mg total) by mouth every 8 (eight) hours as needed. 30 tablet 0   lisinopril (ZESTRIL)  20 MG tablet TAKE 1 TABLET BY MOUTH EVERY DAY 90 tablet 0   methocarbamol (ROBAXIN) 500 MG tablet Take 1 tablet (500 mg total) by mouth 4 (four) times daily. 30 tablet 0   simvastatin (ZOCOR) 20 MG tablet Take 1 tablet (20 mg total) by mouth daily. Needs office visit for further refills 90 tablet 0   No facility-administered medications prior to visit.     No Known Allergies  ROS Review of Systems   Review of Systems  Constitutional: Negative for activity change, appetite change, chills and fever.  HENT: Negative for congestion, nosebleeds, trouble swallowing and voice change.   Respiratory: Negative for cough, shortness of breath and wheezing.   Gastrointestinal: Negative for diarrhea, nausea and vomiting.  Genitourinary: Negative for difficulty urinating, dysuria, flank pain and hematuria.  Musculoskeletal: Negative for back pain, joint swelling and neck pain.  Neurological: Negative for dizziness, speech difficulty, light-headedness and numbness.  Breast:  Positive for mass palpated to left breast See HPI. All other review of systems negative.      Objective:    Physical Exam  Constitutional: She appears well-developed and well-nourished.  Pulmonary/Chest: Left breast exhibits mass.    Skin: Skin is warm and dry.  Psychiatric: She has a normal mood and  affect. Her behavior is normal. Judgment and thought content normal.  Nursing note and vitals reviewed.     BP (!) 149/88 (BP Location: Right Arm, Patient Position: Sitting, Cuff Size: Large)    Pulse 63    Temp 98.2 F (36.8 C) (Oral)    Resp 20  Wt Readings from Last 3 Encounters:  08/09/19 255 lb (115.7 kg)  07/25/19 255 lb (115.7 kg)  12/22/18 252 lb 6.4 oz (114.5 kg)     Health Maintenance Due  Topic Date Due   Hepatitis C Screening  [birth year]   FOOT EXAM  01/31/1974   OPHTHALMOLOGY EXAM  01/31/1974   HIV Screening  02/01/1979   PAP SMEAR-Modifier  01/31/1985   HEMOGLOBIN A1C  08/03/2018     There are no preventive care reminders to display for this patient.  Lab Results  Component Value Date   TSH 4.710 (H) 08/05/2017   Lab Results  Component Value Date   WBC 9.8 02/01/2018   HGB 13.7 02/01/2018   HCT 43.5 02/01/2018   MCV 75.8 (A) 02/01/2018   PLT 385 (H) 08/05/2017   Lab Results  Component Value Date   NA 137 02/01/2018   K 4.4 02/01/2018   CO2 24 02/01/2018   GLUCOSE 113 (H) 02/01/2018   BUN 9 02/01/2018   CREATININE 0.66 02/01/2018   BILITOT 0.4 02/01/2018   ALKPHOS 109 02/01/2018   AST 16 02/01/2018   ALT 13 02/01/2018   PROT 7.5 02/01/2018   ALBUMIN 4.4 02/01/2018   CALCIUM 9.8 02/01/2018   Lab Results  Component Value Date   CHOL 248 (H) 02/01/2018   Lab Results  Component Value Date   HDL 59 02/01/2018   Lab Results  Component Value Date   LDLCALC 165 (H) 02/01/2018   Lab Results  Component Value Date   TRIG 120 02/01/2018   Lab Results  Component Value Date   CHOLHDL 4.2 02/01/2018   Lab Results  Component Value Date   HGBA1C 7.2 (A) 08/13/2019      Assessment & Plan:   Problem List Items Addressed This Visit      Musculoskeletal and Integument   Trapezius muscle spasm (Chronic)     Other   Breast mass, left (Chronic)    Other Visit Diagnoses    Breast mass in female    -  Primary   Relevant Orders   MM Digital Diagnostic Unilat L   Need for immunization against influenza       Screening for metabolic disorder       Relevant Orders   Comprehensive metabolic panel   Pre-diabetes       Relevant Orders   POCT glycosylated hemoglobin (Hb A1C) (Completed)   Diabetic eye exam ([place])       Relevant Orders   Ambulatory referral to Ophthalmology     Mass palpated on left breast.  Order for diagnostic mammogram to left breast.  Will recheck labs due to previous abnormalities.  F/u in 2-3 months.  She is inline with this plan.     Follow-up: No follow-ups on file.    Glyn Ade, NP

## 2019-08-13 NOTE — Telephone Encounter (Signed)
Referral has been put in

## 2019-08-14 ENCOUNTER — Other Ambulatory Visit: Payer: Self-pay | Admitting: Adult Health Nurse Practitioner

## 2019-08-14 LAB — COMPREHENSIVE METABOLIC PANEL
ALT: 11 IU/L (ref 0–32)
AST: 15 IU/L (ref 0–40)
Albumin/Globulin Ratio: 1.5 (ref 1.2–2.2)
Albumin: 4.2 g/dL (ref 3.8–4.9)
Alkaline Phosphatase: 128 IU/L — ABNORMAL HIGH (ref 39–117)
BUN/Creatinine Ratio: 19 (ref 9–23)
BUN: 13 mg/dL (ref 6–24)
Bilirubin Total: 0.3 mg/dL (ref 0.0–1.2)
CO2: 24 mmol/L (ref 20–29)
Calcium: 9.7 mg/dL (ref 8.7–10.2)
Chloride: 96 mmol/L (ref 96–106)
Creatinine, Ser: 0.68 mg/dL (ref 0.57–1.00)
GFR calc Af Amer: 114 mL/min/{1.73_m2} (ref >59)
GFR calc non Af Amer: 99 mL/min/{1.73_m2} (ref >59)
Globulin, Total: 2.8 g/dL (ref 1.5–4.5)
Glucose: 107 mg/dL — ABNORMAL HIGH (ref 65–99)
Potassium: 4.7 mmol/L (ref 3.5–5.2)
Sodium: 136 mmol/L (ref 134–144)
Total Protein: 7 g/dL (ref 6.0–8.5)

## 2019-08-14 LAB — TSH: TSH: 7.13 u[IU]/mL — ABNORMAL HIGH (ref 0.450–4.500)

## 2019-08-14 MED ORDER — LEVOTHYROXINE SODIUM 100 MCG PO TABS: 100.0000 ug | ORAL_TABLET | Freq: Every day | ORAL | 1 refills | Status: DC

## 2019-08-21 ENCOUNTER — Other Ambulatory Visit: Payer: Self-pay | Admitting: Adult Health Nurse Practitioner

## 2019-08-21 DIAGNOSIS — N63 Unspecified lump in unspecified breast: Secondary | ICD-10-CM

## 2019-08-29 ENCOUNTER — Encounter: Payer: Self-pay | Admitting: Radiology

## 2019-09-09 ENCOUNTER — Other Ambulatory Visit: Payer: Self-pay

## 2019-09-10 DIAGNOSIS — C801 Malignant (primary) neoplasm, unspecified: Secondary | ICD-10-CM

## 2019-09-10 HISTORY — DX: Malignant (primary) neoplasm, unspecified: C80.1

## 2019-09-16 ENCOUNTER — Ambulatory Visit
Admission: RE | Admit: 2019-09-16 | Discharge: 2019-09-16 | Disposition: A | Discharge status: Home / Self Care | Payer: 59 | Source: Ambulatory Visit | Attending: Adult Health Nurse Practitioner | Admitting: Adult Health Nurse Practitioner

## 2019-09-16 ENCOUNTER — Other Ambulatory Visit: Payer: Self-pay | Admitting: Adult Health Nurse Practitioner

## 2019-09-16 ENCOUNTER — Other Ambulatory Visit: Payer: Self-pay

## 2019-09-16 DIAGNOSIS — N63 Unspecified lump in unspecified breast: Secondary | ICD-10-CM

## 2019-09-16 DIAGNOSIS — N6320 Unspecified lump in the left breast, unspecified quadrant: Secondary | ICD-10-CM

## 2019-09-24 ENCOUNTER — Ambulatory Visit
Admission: RE | Admit: 2019-09-24 | Discharge: 2019-09-24 | Disposition: A | Discharge status: Home / Self Care | Payer: 59 | Source: Ambulatory Visit | Attending: Adult Health Nurse Practitioner | Admitting: Adult Health Nurse Practitioner

## 2019-09-24 ENCOUNTER — Other Ambulatory Visit: Payer: Self-pay

## 2019-09-24 DIAGNOSIS — N6320 Unspecified lump in the left breast, unspecified quadrant: Secondary | ICD-10-CM

## 2019-09-24 HISTORY — PX: BREAST BIOPSY: SHX20

## 2019-09-27 ENCOUNTER — Telehealth: Payer: Self-pay | Admitting: Hematology and Oncology

## 2019-09-27 NOTE — Telephone Encounter (Signed)
Spoke to patients daughter to confirm morning [facility] appointment for 12/23, packet will be emailed to patient's daughter

## 2019-09-30 ENCOUNTER — Other Ambulatory Visit: Payer: Self-pay | Admitting: *Deleted

## 2019-09-30 DIAGNOSIS — C50412 Malignant neoplasm of upper-outer quadrant of left female breast: Secondary | ICD-10-CM | POA: Insufficient documentation

## 2019-09-30 DIAGNOSIS — Z853 Personal history of malignant neoplasm of breast: Secondary | ICD-10-CM | POA: Insufficient documentation

## 2019-10-01 NOTE — Progress Notes (Signed)
[place] CONSULT NOTE  Patient Care Team: Patient, No Pcp Per as PCP - General (General Practice) Mauro Kaufmann, RN as Oncology Nurse Navigator Rockwell Germany, RN as Oncology Nurse Navigator Nicholas Lose, MD as Consulting Physician (Hematology and Oncology) Eppie Gibson, MD as Attending Physician (Radiation Oncology) Alphonsa Overall, MD as Consulting Physician (General Surgery)  CHIEF COMPLAINTS/PURPOSE OF CONSULTATION:  Newly diagnosed breast cancer  HISTORY OF PRESENTING ILLNESS:  Veronica Velazquez 53 y.o. female is here because of recent diagnosis of HER-2 invasive ductal carcinoma of the left breast. The patient palpated a left breast mass. Diagnostic mammogram and [place] on 09/16/19 showed a 2.3cm mass at the 2 o'clock position, with normal-appearing axillary lymph nodes. Biopsy on 09/24/19 showed invasive ductal carcinoma, grade 3, HER-2 positive (3+), ER/PR negative, Ki67 40%. She presents to the clinic today for initial evaluation and discussion of treatment options.   I reviewed her records extensively and collaborated the history with the patient.  SUMMARY OF ONCOLOGIC HISTORY: Oncology History  Malignant neoplasm of upper-outer quadrant of left breast in female, estrogen receptor negative ([place])  09/30/2019 Initial Diagnosis   Patient palpated left breast mass. Mammogram showed a 2.3cm mass at the 2 o'clock position, normal-appearing axillary lymph nodes. Biopsy showed IDC, grade 3, HER-2 + (3+), ER/PR -, Ki67 40%.   10/02/2019 Cancer Staging   Staging form: Breast, AJCC 8th Edition - Clinical stage from 10/02/2019: Stage IIA (cT2, cN0, cM0, G3, ER-, PR-, HER2+) - Signed by Nicholas Lose, MD on 10/02/2019     MEDICAL HISTORY:  Past Medical History:  Diagnosis Date  . Asthma   . Breast mass, left 08/13/2019  . Depression   . Hyperglycemia   . Hypertension   . Hypothyroidism   . Microcytosis   . Obesity   . Prediabetes   . Trapezius muscle spasm  08/09/2019    SURGICAL HISTORY: Past Surgical History:  Procedure Laterality Date  . ABDOMINAL HYSTERECTOMY      SOCIAL HISTORY: Social History   Socioeconomic History  . Marital status: Married    Spouse name: Ghulam  . Number of children: 3  . Years of education: 7  . Highest education level: Not on file  Occupational History  . Occupation: homemaker  Tobacco Use  . Smoking status: Never Smoker  . Smokeless tobacco: Never Used  Substance and Sexual Activity  . Alcohol use: No  . Drug use: No  . Sexual activity: Yes    Partners: Male  Other Topics Concern  . Not on file  Social History Narrative   Lives with her husband and two daughters.  They care for their 74 year-old granddaughter during the day while her mother (their daughter) works.   Social Determinants of Health   Financial Resource Strain:   . Difficulty of Paying Living Expenses: Not on file  Food Insecurity:   . Worried About Charity fundraiser in the Last Year: Not on file  . Ran Out of Food in the Last Year: Not on file  Transportation Needs:   . Lack of Transportation (Medical): Not on file  . Lack of Transportation (Non-Medical): Not on file  Physical Activity:   . Days of Exercise per Week: Not on file  . Minutes of Exercise per Session: Not on file  Stress:   . Feeling of Stress : Not on file  Social Connections:   . Frequency of Communication with Friends and Family: Not on file  . Frequency of Social  Gatherings with Friends and Family: Not on file  . Attends Religious Services: Not on file  . Active Member of Clubs or Organizations: Not on file  . Attends Archivist Meetings: Not on file  . Marital Status: Not on file  Intimate Partner Violence:   . Fear of Current or Ex-Partner: Not on file  . Emotionally Abused: Not on file  . Physically Abused: Not on file  . Sexually Abused: Not on file    FAMILY HISTORY: Family History  Problem Relation Age of Onset  . Diabetes  Brother        drug-induced    ALLERGIES:  has No Known Allergies.  MEDICATIONS:  Current Outpatient Medications  Medication Sig Dispense Refill  . albuterol (PROVENTIL HFA;VENTOLIN HFA) 108 (90 Base) MCG/ACT inhaler Inhale 2 puffs into the lungs every 3 (three) hours as needed for wheezing (cough, shortness of breath or wheezing.). 1 Inhaler 3  . atenolol-chlorthalidone (TENORETIC) 100-25 MG tablet TAKE 1/2 TABLET BY MOUTH DAILY 45 tablet 0  . levothyroxine (SYNTHROID) 100 MCG tablet Take 1 tablet (100 mcg total) by mouth daily. 30 tablet 1  . lisinopril (ZESTRIL) 20 MG tablet TAKE 1 TABLET BY MOUTH EVERY DAY 90 tablet 0  . simvastatin (ZOCOR) 20 MG tablet TAKE 1 TABLET (20 MG TOTAL) BY MOUTH DAILY. NEEDS OFFICE VISIT FOR FURTHER REFILLS 90 tablet 0  . guaiFENesin-codeine (ROBITUSSIN AC) 100-10 MG/5ML syrup Take 5-10 mLs by mouth 4 (four) times daily as needed for cough. (Patient not taking: Reported on 10/02/2019) 180 mL 0  . ibuprofen (ADVIL) 800 MG tablet Take 1 tablet (800 mg total) by mouth every 8 (eight) hours as needed. (Patient not taking: Reported on 10/02/2019) 30 tablet 0  . methocarbamol (ROBAXIN) 500 MG tablet Take 1 tablet (500 mg total) by mouth 4 (four) times daily. (Patient not taking: Reported on 10/02/2019) 30 tablet 0   No current facility-administered medications for this visit.    REVIEW OF SYSTEMS:   Constitutional: Denies fevers, chills or abnormal night sweats Eyes: Denies blurriness of vision, double vision or watery eyes Ears, nose, mouth, throat, and face: Denies mucositis or sore throat Respiratory: Denies cough, dyspnea or wheezes Cardiovascular: Denies palpitation, chest discomfort or lower extremity swelling Gastrointestinal:  Denies nausea, heartburn or change in bowel habits Skin: Denies abnormal skin rashes Lymphatics: Denies new lymphadenopathy or easy bruising Neurological:Denies numbness, tingling or new weaknesses Behavioral/Psych: Mood is  stable, no new changes  Breast: Palpable lump in breast All other systems were reviewed with the patient and are negative.  PHYSICAL EXAMINATION: ECOG PERFORMANCE STATUS: 1 - Symptomatic but completely ambulatory  Vitals:   10/02/19 0915  BP: (!) 141/98  Pulse: 87  Resp: 18  Temp: 98.2 F (36.8 C)  SpO2: 100%   Filed Weights   10/02/19 0915  Weight: 251 lb 9.6 oz (114.1 kg)    GENERAL:alert, no distress and comfortable SKIN: skin color, texture, turgor are normal, no rashes or significant lesions EYES: normal, conjunctiva are pink and non-injected, sclera clear OROPHARYNX:no exudate, no erythema and lips, buccal mucosa, and tongue normal  NECK: supple, thyroid normal size, non-tender, without nodularity LYMPH:  no palpable lymphadenopathy in the cervical, axillary or inguinal LUNGS: clear to auscultation and percussion with normal breathing effort HEART: regular rate & rhythm and no murmurs and no lower extremity edema ABDOMEN:abdomen soft, non-tender and normal bowel sounds Musculoskeletal:no cyanosis of digits and no clubbing  PSYCH: alert & oriented x 3 with fluent speech NEURO:  no focal motor/sensory deficits BREAST: Biopsy site changes and palpable lump. No palpable axillary or supraclavicular lymphadenopathy (exam performed in the presence of a chaperone)   LABORATORY DATA:  I have reviewed the data as listed Lab Results  Component Value Date   WBC 8.3 10/02/2019   HGB 13.1 10/02/2019   HCT 42.1 10/02/2019   MCV 75.7 (L) 10/02/2019   PLT 331 10/02/2019   Lab Results  Component Value Date   NA 137 10/02/2019   K 3.9 10/02/2019   CL 99 10/02/2019   CO2 28 10/02/2019    RADIOGRAPHIC STUDIES: I have personally reviewed the radiological reports and agreed with the findings in the report.  ASSESSMENT AND PLAN:  Malignant neoplasm of upper-outer quadrant of left breast in female, estrogen receptor negative ([place]) 09/30/2019: Patient palpated left breast mass.  Mammogram showed a 2.3cm mass at the 2 o'clock position, normal-appearing axillary lymph nodes. Biopsy showed IDC, grade 3, HER-2 + (3+), ER/PR -, Ki67 40%. T2 N0 stage IIa clinical stage  Pathology and radiology counseling: Discussed with the patient, the details of pathology including the type of breast cancer,the clinical staging, the significance of ER, PR and HER-2/neu receptors and the implications for treatment. After reviewing the pathology in detail, we proceeded to discuss the different treatment options between surgery, radiation, chemotherapy, antiestrogen therapies.  Treatment plan: 1.  Neoadjuvant chemotherapy with Taxol-Herceptin weekly X 12 followed by Herceptin maintenance for 1 year versus Kadcyla maintenance depending on the final response 2.  Breast conserving surgery with sentinel lymph node biopsy 3.  Adjuvant radiation   Chemotherapy Counseling: I discussed the risks and benefits of chemotherapy including the risks of nausea/ vomiting, risk of infection from low WBC count, fatigue due to chemo or anemia, bruising or bleeding due to low platelets, mouth sores, loss/ change in taste and decreased appetite. Liver and kidney function will be monitored through out chemotherapy as abnormalities in liver and kidney function may be a side effect of treatment. Cardiac dysfunction due to  Herceptin  were discussed in detail. Risk of permanent bone marrow dysfunction and leukemia due to chemo were also discussed.  Plan: 1.  Breast MRI 2.  Echocardiogram 3.  Chemo class 4.  Port placement  Start chemotherapy in 1 to 2 weeks   All questions were answered. The patient knows to call the clinic with any problems, questions or concerns.   Rulon Eisenmenger, MD, MPH 10/02/2019    I, Molly Dorshimer, am acting as scribe for Nicholas Lose, MD.  I have reviewed the above documentation for accuracy and completeness, and I agree with the above.

## 2019-10-01 NOTE — Progress Notes (Signed)
Radiation Oncology         ([phone]) [phone] ________________________________  Initial outpatient Consultation in person  Name: Veronica Velazquez MRN: [id number]  Date: 10/02/2019  DOB: 15-Sep-1966  HB:[id number], No Pcp Per  Alphonsa Overall, MD   REFERRING PHYSICIAN: Alphonsa Overall, MD  DIAGNOSIS:    ICD-10-CM   1. Malignant neoplasm of upper-outer quadrant of left breast in female, estrogen receptor negative ([place])  C50.412    Z17.1   Cancer Staging Malignant neoplasm of upper-outer quadrant of left breast in female, estrogen receptor negative ([place]) Staging form: Breast, AJCC 8th Edition - Clinical stage from 10/02/2019: Stage IIA (cT2, cN0, cM0, G3, ER-, PR-, HER2+) - Signed by Nicholas Lose, MD on 10/02/2019   CHIEF COMPLAINT: Here to discuss management of left breast cancer  HISTORY OF PRESENT ILLNESS::Veronica Velazquez is a 53 y.o. female who presented with breast abnormality on the following imaging: diagnostic mammogram on the date of 09/16/2019.  Symptoms, if any, at that time, were: palpable left breast lump.   Ultrasound of breast revealed suspicious 2.3 cm mass at palpable site in upper-outer left breast.  No evidence of axillary adenopathy on the left.  No evidence of malignancy in the right breast.  Biopsy on date of 09/24/2019 showed invasive ductal carcinoma.  ER status: negative; PR status negative, Her2 status positive; Grade 3.   She is here with her daughter.  They report that this has been a very stressful diagnosis.  Her daughter provides translation.  PREVIOUS RADIATION THERAPY: No  PAST MEDICAL HISTORY:  has a past medical history of Asthma, Breast mass, left (08/13/2019), Depression, Hyperglycemia, Hypertension, Hypothyroidism, Microcytosis, Obesity, Prediabetes, and Trapezius muscle spasm (08/09/2019).    PAST SURGICAL HISTORY: Past Surgical History:  Procedure Laterality Date  . ABDOMINAL HYSTERECTOMY      FAMILY HISTORY: family history includes Diabetes in her  brother.  SOCIAL HISTORY:  reports that she has never smoked. She has never used smokeless tobacco. She reports that she does not drink alcohol or use drugs.  ALLERGIES: Patient has no known allergies.  MEDICATIONS:  Current Outpatient Medications  Medication Sig Dispense Refill  . albuterol (PROVENTIL HFA;VENTOLIN HFA) 108 (90 Base) MCG/ACT inhaler Inhale 2 puffs into the lungs every 3 (three) hours as needed for wheezing (cough, shortness of breath or wheezing.). 1 Inhaler 3  . atenolol-chlorthalidone (TENORETIC) 100-25 MG tablet TAKE 1/2 TABLET BY MOUTH DAILY 45 tablet 0  . guaiFENesin-codeine (ROBITUSSIN AC) 100-10 MG/5ML syrup Take 5-10 mLs by mouth 4 (four) times daily as needed for cough. (Patient not taking: Reported on 10/02/2019) 180 mL 0  . ibuprofen (ADVIL) 800 MG tablet Take 1 tablet (800 mg total) by mouth every 8 (eight) hours as needed. (Patient not taking: Reported on 10/02/2019) 30 tablet 0  . levothyroxine (SYNTHROID) 100 MCG tablet Take 1 tablet (100 mcg total) by mouth daily. 30 tablet 1  . lidocaine-prilocaine (EMLA) cream Apply to affected area once 30 g 3  . lisinopril (ZESTRIL) 20 MG tablet TAKE 1 TABLET BY MOUTH EVERY DAY 90 tablet 0  . LORazepam (ATIVAN) 0.5 MG tablet Take 1 tablet (0.5 mg total) by mouth every 6 (six) hours as needed (Nausea or vomiting). 30 tablet 0  . methocarbamol (ROBAXIN) 500 MG tablet Take 1 tablet (500 mg total) by mouth 4 (four) times daily. (Patient not taking: Reported on 10/02/2019) 30 tablet 0  . ondansetron (ZOFRAN) 8 MG tablet Take 1 tablet (8 mg total) by mouth 2 (two) times daily as  needed (Nausea or vomiting). 30 tablet 1  . prochlorperazine (COMPAZINE) 10 MG tablet Take 1 tablet (10 mg total) by mouth every 6 (six) hours as needed (Nausea or vomiting). 30 tablet 1  . simvastatin (ZOCOR) 20 MG tablet TAKE 1 TABLET (20 MG TOTAL) BY MOUTH DAILY. NEEDS OFFICE VISIT FOR FURTHER REFILLS 90 tablet 0   No current facility-administered  medications for this encounter.    REVIEW OF SYSTEMS: As above  PHYSICAL EXAM:   General: Alert and oriented, in no acute distress Psychiatric: Judgment and insight are intact. Affect is appropriate. Breasts: Left breast is notable for a 2 and 1/2 centimeter palpable mass in the upper outer quadrant. No other palpable masses appreciated in the breasts or axillae bilaterally.    ECOG = 1  0 - Asymptomatic (Fully active, able to carry on all predisease activities without restriction)  1 - Symptomatic but completely ambulatory (Restricted in physically strenuous activity but ambulatory and able to carry out work of a light or sedentary nature. For example, light housework, office work)  2 - Symptomatic, <50% in bed during the day (Ambulatory and capable of all self care but unable to carry out any work activities. Up and about more than 50% of waking hours)  3 - Symptomatic, >50% in bed, but not bedbound (Capable of only limited self-care, confined to bed or chair 50% or more of waking hours)  4 - Bedbound (Completely disabled. Cannot carry on any self-care. Totally confined to bed or chair)  5 - Death   Eustace Pen MM, Creech RH, Tormey DC, et al. [phone]). "Toxicity and response criteria of the [facility]". [place] Oncol. 5 (6): 649-55   LABORATORY DATA:  Lab Results  Component Value Date   WBC 8.3 10/02/2019   HGB 13.1 10/02/2019   HCT 42.1 10/02/2019   MCV 75.7 (L) 10/02/2019   PLT 331 10/02/2019   CMP     Component Value Date/Time   NA 137 10/02/2019 0802   NA 136 08/13/2019 1515   K 3.9 10/02/2019 0802   CL 99 10/02/2019 0802   CO2 28 10/02/2019 0802   GLUCOSE 134 (H) 10/02/2019 0802   BUN 18 10/02/2019 0802   BUN 13 08/13/2019 1515   CREATININE 0.74 10/02/2019 0802   CREATININE 0.61 07/27/2016 1729   CALCIUM 9.4 10/02/2019 0802   PROT 7.8 10/02/2019 0802   PROT 7.0 08/13/2019 1515   ALBUMIN 3.7 10/02/2019 0802   ALBUMIN 4.2 08/13/2019  1515   AST 13 (L) 10/02/2019 0802   ALT 12 10/02/2019 0802   ALKPHOS 106 10/02/2019 0802   BILITOT 0.5 10/02/2019 0802   GFRNONAA >60 10/02/2019 0802   GFRNONAA >89 07/27/2016 1729   GFRAA >60 10/02/2019 0802   GFRAA >89 07/27/2016 1729         RADIOGRAPHY: US BREAST LTD UNI LEFT INC AXILLA  Result Date: 09/16/2019 CLINICAL DATA:  53 year old female presenting for evaluation of a palpable lump left breast identified on self exam. EXAM: DIGITAL DIAGNOSTIC BILATERAL MAMMOGRAM WITH CAD AND TOMO ULTRASOUND LEFT BREAST COMPARISON:  None. ACR Breast Density Category b: There are scattered areas of fibroglandular density. FINDINGS: A BB has been placed on the upper-outer quadrant of the left breast indicating the palpable site of concern. Deep to the palpable marker, there is an irregular spiculated mass with associated tightly clustered calcifications. In the inferior central right breast there is a low-density asymmetry resolves on spot compression tomosynthesis imaging. Mammographic images were processed  with CAD. Ultrasound targeted to the left breast at 2 o'clock, 8 cm from the nipple demonstrates in irregular hypoechoic mass with internal calcifications measuring approximately 2.3 x 1.9 x 2.3 cm. Ultrasound of the left axilla demonstrates multiple normal-appearing lymph nodes. IMPRESSION: 1. There is a highly suspicious mass at the palpable site in the upper-outer quadrant of the left breast. 2.  No evidence of left axillary lymphadenopathy. 3.  No evidence of malignancy in the right breast. RECOMMENDATION: 1. Ultrasound-guided biopsy is recommended for the left breast mass. This has been scheduled for 09/24/2019 at 3:45 p.m. I have discussed the findings and recommendations with the patient. If applicable, a reminder letter will be sent to the patient regarding the next appointment. BI-RADS CATEGORY  5: Highly suggestive of malignancy. Electronically Signed   By: Ammie Ferrier M.D.   On:  09/16/2019 16:37   MM DIAG BREAST TOMO BILATERAL  Result Date: 09/16/2019 CLINICAL DATA:  53 year old female presenting for evaluation of a palpable lump left breast identified on self exam. EXAM: DIGITAL DIAGNOSTIC BILATERAL MAMMOGRAM WITH CAD AND TOMO ULTRASOUND LEFT BREAST COMPARISON:  None. ACR Breast Density Category b: There are scattered areas of fibroglandular density. FINDINGS: A BB has been placed on the upper-outer quadrant of the left breast indicating the palpable site of concern. Deep to the palpable marker, there is an irregular spiculated mass with associated tightly clustered calcifications. In the inferior central right breast there is a low-density asymmetry resolves on spot compression tomosynthesis imaging. Mammographic images were processed with CAD. Ultrasound targeted to the left breast at 2 o'clock, 8 cm from the nipple demonstrates in irregular hypoechoic mass with internal calcifications measuring approximately 2.3 x 1.9 x 2.3 cm. Ultrasound of the left axilla demonstrates multiple normal-appearing lymph nodes. IMPRESSION: 1. There is a highly suspicious mass at the palpable site in the upper-outer quadrant of the left breast. 2.  No evidence of left axillary lymphadenopathy. 3.  No evidence of malignancy in the right breast. RECOMMENDATION: 1. Ultrasound-guided biopsy is recommended for the left breast mass. This has been scheduled for 09/24/2019 at 3:45 p.m. I have discussed the findings and recommendations with the patient. If applicable, a reminder letter will be sent to the patient regarding the next appointment. BI-RADS CATEGORY  5: Highly suggestive of malignancy. Electronically Signed   By: Ammie Ferrier M.D.   On: 09/16/2019 16:37   MM CLIP PLACEMENT LEFT  Result Date: 09/24/2019 CLINICAL DATA:  Patient underwent biopsy of a left breast mass. EXAM: DIAGNOSTIC LEFT MAMMOGRAM POST ULTRASOUND BIOPSY COMPARISON:  Previous exam(s). FINDINGS: Mammographic images were  obtained following ultrasound guided biopsy of a left breast mass at 2 o'clock. The biopsy marking clip is in expected position at the site of biopsy. IMPRESSION: Appropriate positioning of the ribbon shaped biopsy marking clip at the site of biopsy in the left breast at 2 o'clock. Final Assessment: Post Procedure Mammograms for Marker Placement Electronically Signed   By: Audie Pinto M.D.   On: 09/24/2019 16:41   [place] LT BREAST BX W LOC DEV 1ST LESION IMG BX SPEC US GUIDE  Addendum Date: 09/26/2019   ADDENDUM REPORT: 09/26/2019 08:21 ADDENDUM: Pathology revealed GRADE III INVASIVE DUCTAL CARCINOMA of the LEFT breast, 2 o'clock. This was found to be concordant by Dr. Audie Pinto. Pathology results were discussed with the patient's daughter Trinaty Bundrick) by speaker phone. The patient reported doing well after the biopsy with tenderness at the site. Post biopsy instructions and care were reviewed and questions  were answered. The patient and family were encouraged to call [facility] for any additional concerns. The patient was referred to [facility] on October 02, 2019. Pathology results reported by Stacie Acres, RN on 09/26/2019. Electronically Signed   By: Audie Pinto M.D.   On: 09/26/2019 08:21   Result Date: 09/26/2019 CLINICAL DATA:  Patient presents for biopsy of a left breast mass. EXAM: ULTRASOUND GUIDED LEFT BREAST CORE NEEDLE BIOPSY COMPARISON:  Previous exam(s). FINDINGS: I met with the patient and we discussed the procedure of ultrasound-guided biopsy, including benefits and alternatives. We discussed the high likelihood of a successful procedure. We discussed the risks of the procedure, including infection, bleeding, tissue injury, clip migration, and inadequate sampling. Informed written consent was given. The usual time-out protocol was performed immediately prior to the  procedure. Lesion quadrant: Upper outer quadrant Using sterile technique and 1% Lidocaine as local anesthetic, under direct ultrasound visualization, a 12 gauge spring-loaded device was used to perform biopsy of a left breast mass at 2 o'clock using a lateral approach. At the conclusion of the procedure a ribbon tissue marker clip was deployed into the biopsy cavity. Follow up 2 view mammogram was performed and dictated separately. IMPRESSION: Ultrasound guided biopsy of a left breast mass at 2 o'clock. No apparent complications. Electronically Signed: By: Audie Pinto M.D. On: 09/24/2019 16:43      IMPRESSION/PLAN: Left Breast Cancer   MRI of the breasts is pending.  Consensus is to proceed with neoadjuvant chemotherapy.  Breast conservation is anticipated surgically thereafter.  It was a pleasure meeting the patient today. We discussed the risks, benefits, and side effects of radiotherapy. I recommend postoperative radiotherapy to the left breast to reduce her risk of locoregional recurrence by 2/3.  We discussed that radiation would take approximately 4-6 weeks to complete and that I would give the patient a few weeks to heal following surgery before starting treatment planning. We spoke about acute effects including skin irritation and fatigue as well as much less common late effects including internal organ injury or irritation. We spoke about the latest technology that is used to minimize the risk of late effects for patients undergoing radiotherapy to the breast or chest wall. No guarantees of treatment were given. The patient is enthusiastic about proceeding with treatment. I look forward to participating in the patient's care.  I will await her referral back to me for postoperative follow-up and eventual CT simulation/treatment planning.  I spent 20 minutes  face to face with the patient and more than 50% of that time was spent in counseling and/or coordination of care.     __________________________________________   Eppie Gibson, MD   This document serves as a record of services personally performed by Eppie Gibson, MD. It was created on her behalf by Wilburn Mylar, a trained medical scribe. The creation of this record is based on the scribe's personal observations and the provider's statements to them. This document has been checked and approved by the attending provider.

## 2019-10-02 ENCOUNTER — Other Ambulatory Visit: Payer: Self-pay | Admitting: *Deleted

## 2019-10-02 ENCOUNTER — Ambulatory Visit: Payer: Managed Care, Other (non HMO) | Attending: Surgery | Admitting: Physical Therapy

## 2019-10-02 ENCOUNTER — Inpatient Hospital Stay (HOSPITAL_BASED_OUTPATIENT_CLINIC_OR_DEPARTMENT_OTHER): Payer: Managed Care, Other (non HMO) | Admitting: Hematology and Oncology

## 2019-10-02 ENCOUNTER — Encounter: Payer: Self-pay | Admitting: Hematology and Oncology

## 2019-10-02 ENCOUNTER — Other Ambulatory Visit: Payer: Self-pay | Admitting: Surgery

## 2019-10-02 ENCOUNTER — Ambulatory Visit
Admission: RE | Admit: 2019-10-02 | Discharge: 2019-10-02 | Disposition: A | Discharge status: Home / Self Care | Payer: Managed Care, Other (non HMO) | Source: Ambulatory Visit | Attending: Radiation Oncology | Admitting: Radiation Oncology

## 2019-10-02 ENCOUNTER — Other Ambulatory Visit: Payer: Self-pay

## 2019-10-02 ENCOUNTER — Encounter: Payer: Self-pay | Admitting: Physical Therapy

## 2019-10-02 ENCOUNTER — Inpatient Hospital Stay: Payer: Managed Care, Other (non HMO)

## 2019-10-02 VITALS — BP 141/98 | HR 87 | Temp 98.2°F | Resp 18 | Ht 64.0 in | Wt 251.6 lb

## 2019-10-02 DIAGNOSIS — C50412 Malignant neoplasm of upper-outer quadrant of left female breast: Secondary | ICD-10-CM

## 2019-10-02 DIAGNOSIS — R293 Abnormal posture: Secondary | ICD-10-CM | POA: Insufficient documentation

## 2019-10-02 DIAGNOSIS — Z171 Estrogen receptor negative status [ER-]: Secondary | ICD-10-CM | POA: Insufficient documentation

## 2019-10-02 LAB — CBC WITH DIFFERENTIAL (CANCER CENTER ONLY)
Abs Immature Granulocytes: 0.02 10*3/uL (ref 0.00–0.07)
Basophils Absolute: 0 10*3/uL (ref 0.0–0.1)
Basophils Relative: 1 %
Eosinophils Absolute: 0.2 10*3/uL (ref 0.0–0.5)
Eosinophils Relative: 2 %
HCT: 42.1 % (ref 36.0–46.0)
Hemoglobin: 13.1 g/dL (ref 12.0–15.0)
Immature Granulocytes: 0 %
Lymphocytes Relative: 24 %
Lymphs Abs: 2 10*3/uL (ref 0.7–4.0)
MCH: 23.6 pg — ABNORMAL LOW (ref 26.0–34.0)
MCHC: 31.1 g/dL (ref 30.0–36.0)
MCV: 75.7 fL — ABNORMAL LOW (ref 80.0–100.0)
Monocytes Absolute: 0.7 10*3/uL (ref 0.1–1.0)
Monocytes Relative: 8 %
Neutro Abs: 5.4 10*3/uL (ref 1.7–7.7)
Neutrophils Relative %: 65 %
Platelet Count: 331 10*3/uL (ref 150–400)
RBC: 5.56 MIL/uL — ABNORMAL HIGH (ref 3.87–5.11)
RDW: 15.6 % — ABNORMAL HIGH (ref 11.5–15.5)
WBC Count: 8.3 10*3/uL (ref 4.0–10.5)
nRBC: 0 % (ref 0.0–0.2)

## 2019-10-02 LAB — CMP (CANCER CENTER ONLY)
ALT: 12 U/L (ref 0–44)
AST: 13 U/L — ABNORMAL LOW (ref 15–41)
Albumin: 3.7 g/dL (ref 3.5–5.0)
Alkaline Phosphatase: 106 U/L (ref 38–126)
Anion gap: 10 (ref 5–15)
BUN: 18 mg/dL (ref 6–20)
CO2: 28 mmol/L (ref 22–32)
Calcium: 9.4 mg/dL (ref 8.9–10.3)
Chloride: 99 mmol/L (ref 98–111)
Creatinine: 0.74 mg/dL (ref 0.44–1.00)
GFR, Est AFR Am: >60 mL/min (ref >60)
GFR, Estimated: >60 mL/min (ref >60)
Glucose, Bld: 134 mg/dL — ABNORMAL HIGH (ref 70–99)
Potassium: 3.9 mmol/L (ref 3.5–5.1)
Sodium: 137 mmol/L (ref 135–145)
Total Bilirubin: 0.5 mg/dL (ref 0.3–1.2)
Total Protein: 7.8 g/dL (ref 6.5–8.1)

## 2019-10-02 LAB — GENETIC SCREENING ORDER

## 2019-10-02 LAB — HM MAMMOGRAPHY

## 2019-10-02 MED ORDER — ONDANSETRON HCL 8 MG PO TABS: 8.0000 mg | ORAL_TABLET | Freq: Two times a day (BID) | ORAL | 1 refills | Status: DC | PRN

## 2019-10-02 MED ORDER — LORAZEPAM 0.5 MG PO TABS: 0.5000 mg | ORAL_TABLET | Freq: Four times a day (QID) | ORAL | 0 refills | Status: DC | PRN

## 2019-10-02 MED ORDER — LIDOCAINE-PRILOCAINE 2.5-2.5 % EX CREA: TOPICAL_CREAM | CUTANEOUS | 3 refills | Status: DC

## 2019-10-02 MED ORDER — PROCHLORPERAZINE MALEATE 10 MG PO TABS: 10.0000 mg | ORAL_TABLET | Freq: Four times a day (QID) | ORAL | 1 refills | Status: DC | PRN

## 2019-10-02 NOTE — Assessment & Plan Note (Addendum)
09/30/2019: Patient palpated left breast mass. Mammogram showed a 2.3cm mass at the 2 o'clock position, normal-appearing axillary lymph nodes. Biopsy showed IDC, grade 3, HER-2 + (3+), ER/PR -, Ki67 40%. T2 N0 stage IIa clinical stage  Pathology and radiology counseling: Discussed with the patient, the details of pathology including the type of breast cancer,the clinical staging, the significance of ER, PR and HER-2/neu receptors and the implications for treatment. After reviewing the pathology in detail, we proceeded to discuss the different treatment options between surgery, radiation, chemotherapy, antiestrogen therapies.  Treatment plan: 1.  Neoadjuvant chemotherapy with TCH Perjeta x6 cycles followed by Herceptin Perjeta maintenance for 1 year versus Kadcyla maintenance depending on the final response 2.  Breast conserving surgery with sentinel lymph node biopsy 3.  Adjuvant radiation   Chemotherapy Counseling: I discussed the risks and benefits of chemotherapy including the risks of nausea/ vomiting, risk of infection from low WBC count, fatigue due to chemo or anemia, bruising or bleeding due to low platelets, mouth sores, loss/ change in taste and decreased appetite. Liver and kidney function will be monitored through out chemotherapy as abnormalities in liver and kidney function may be a side effect of treatment. Cardiac dysfunction due to  Herceptin and Perjeta were discussed in detail. Risk of permanent bone marrow dysfunction and leukemia due to chemo were also discussed.  Plan: 1.  Breast MRI 2.  Echocardiogram 3.  Chemo class 4.  Port placement  Start chemotherapy in 1 to 2 weeks

## 2019-10-02 NOTE — Therapy (Signed)
[place], [place], [address] Phone: [phone]   Fax:  [phone]  Physical Therapy Evaluation  Patient Details  Name: Veronica Velazquez MRN: [id number] Date of Birth: 09-30-1966 Referring Provider (PT): Dr. Alphonsa Overall   Encounter Date: 10/02/2019  PT End of Session - 10/02/19 1056    Visit Number  1    Number of Visits  1    PT Start Time  1007    PT Stop Time  1030    PT Time Calculation (min)  23 min    Activity Tolerance  Patient tolerated treatment well    Behavior During Therapy  [facility] for tasks assessed/performed       Past Medical History:  Diagnosis Date  . Asthma   . Breast mass, left 08/13/2019  . Depression   . Hyperglycemia   . Hypertension   . Hypothyroidism   . Microcytosis   . Obesity   . Prediabetes   . Trapezius muscle spasm 08/09/2019    Past Surgical History:  Procedure Laterality Date  . ABDOMINAL HYSTERECTOMY      There were no vitals filed for this visit.   Subjective Assessment - 10/02/19 1048    Subjective  Patient reports she is here today to be seen by her medical team for her newly diagnosed left breast cancer.    Patient is accompained by:  Family member;Interpreter   Her adult daughter is interpreting   Pertinent History  Patient was diagnosed on 09/16/2019 with left grade III invasive ductal carcinoma breast cancer. It measures 2.3 cm and is located in the upper outer quadrant. It is ER/PR negative and HER2 positive with a Ki67 of 40%. She has morbid obesity with a BMI of 43.19. She has no other known medical problems.    Patient Stated Goals  Reduce lymphedema risk and learn post op shoulder ROM HEP    Currently in Pain?  No/denies         [facility] PT Assessment - 10/02/19 0001      Assessment   Medical Diagnosis  Left breast cancer    Referring Provider (PT)  Dr. Alphonsa Overall    Onset Date/Surgical Date  09/16/19    Hand Dominance  Right    Prior Therapy  none      Precautions   Precautions  Other (comment)    Precaution Comments  active cancer      Restrictions   Weight Bearing Restrictions  No      Balance Screen   Has the patient fallen in the past 6 months  No    Has the patient had a decrease in activity level because of a fear of falling?   No    Is the patient reluctant to leave their home because of a fear of falling?   No      Home Film/video editor residence    Living Arrangements  Spouse/significant other    Available Help at Discharge  Family      Prior Function   Level of [place]  Unemployed    Leisure  She does not exercise      Cognition   Overall Cognitive Status  Within Functional Limits for tasks assessed      Posture/Postural Control   Posture/Postural Control  Postural limitations    Postural Limitations  Rounded Shoulders;Forward head      ROM / Strength  AROM / PROM / Strength  AROM;Strength      AROM   Overall AROM Comments  Cervical AROM is WNL    AROM Assessment Site  Shoulder    Right/Left Shoulder  Right;Left    Right Shoulder Extension  48 Degrees    Right Shoulder Flexion  140 Degrees    Right Shoulder ABduction  143 Degrees    Right Shoulder Internal Rotation  60 Degrees    Right Shoulder External Rotation  98 Degrees    Left Shoulder Extension  49 Degrees    Left Shoulder Flexion  140 Degrees    Left Shoulder ABduction  140 Degrees    Left Shoulder Internal Rotation  65 Degrees    Left Shoulder External Rotation  80 Degrees      Strength   Overall Strength  Within functional limits for tasks performed        LYMPHEDEMA/ONCOLOGY QUESTIONNAIRE - 10/02/19 1053      Type   Cancer Type  Left breast      Lymphedema Assessments   Lymphedema Assessments  Upper extremities      Right Upper Extremity Lymphedema   10 cm Proximal to Olecranon Process  45 cm    Olecranon Process  29.5 cm    10 cm Proximal to Ulnar  Styloid Process  24.6 cm    Just Proximal to Ulnar Styloid Process  17.4 cm    Across Hand at [employer]  18.5 cm    At [place] of 2nd Digit  6.3 cm      Left Upper Extremity Lymphedema   10 cm Proximal to Olecranon Process  43.7 cm    Olecranon Process  29.3 cm    10 cm Proximal to Ulnar Styloid Process  25.2 cm    Just Proximal to Ulnar Styloid Process  17.8 cm    Across Hand at [employer]  18.8 cm    At [place] of 2nd Digit  5.9 cm          Quick Dash - 10/02/19 0001    Open a tight or new jar  No difficulty    Do heavy household chores (wash walls, wash floors)  No difficulty    Carry a shopping bag or briefcase  No difficulty    Wash your back  No difficulty    Use a knife to cut food  No difficulty    Recreational activities in which you take some force or impact through your arm, shoulder, or hand (golf, hammering, tennis)  No difficulty    During the past week, to what extent has your arm, shoulder or hand problem interfered with your normal social activities with family, friends, neighbors, or groups?  Not at all    During the past week, to what extent has your arm, shoulder or hand problem limited your work or other regular daily activities  Not at all    Arm, shoulder, or hand pain.  None    Tingling (pins and needles) in your arm, shoulder, or hand  None    Difficulty Sleeping  No difficulty    DASH Score  0 %        Objective measurements completed on examination: See above findings.      Patient was instructed today in a home exercise program today for post op shoulder range of motion. These included active assist shoulder flexion in sitting, scapular retraction, wall walking with shoulder abduction, and hands behind head external rotation.  She was encouraged to do these twice a day, holding 3 seconds and repeating 5 times when permitted by her physician.        PT Education - 10/02/19 1055    Education Details  Lymphedema risk reduction and post op  shoulder ROM HEP    Person(s) Educated  Patient;Child(ren)   Daughter   Methods  Explanation;Demonstration;Handout    Comprehension  Returned demonstration;Verbalized understanding           Breast Clinic Goals - 10/02/19 1100      Patient will be able to verbalize understanding of pertinent lymphedema risk reduction practices relevant to her diagnosis specifically related to skin care.   Time  1    Period  Days    Status  Achieved      Patient will be able to return demonstrate and/or verbalize understanding of the post-op home exercise program related to regaining shoulder range of motion.   Time  1    Period  Days    Status  Achieved      Patient will be able to verbalize understanding of the importance of attending the postoperative After Breast Cancer Class for further lymphedema risk reduction education and therapeutic exercise.   Time  1    Period  Days    Status  Achieved            Plan - 10/02/19 1056    Clinical Impression Statement  Patient was diagnosed on 09/16/2019 with left grade III invasive ductal carcinoma breast cancer. It measures 2.3 cm and is located in the upper outer quadrant. It is ER/PR negative and HER2 positive with a Ki67 of 40%. She has morbid obesity with a BMI of 43.19. She has no other known medical problems. Her multidisciplinary medical team met prior to her assessments to determine a recommended treatment plan. She is planning to have neoadjuvant chemotherapy followed by a left lumpectomy and sentinel node biopsy followed by radiation. She will benefit from a post op PT reassessment to determine needs.    Stability/Clinical Decision Making  Stable/Uncomplicated    Clinical Decision Making  Low    Rehab Potential  Excellent    PT Frequency  One time visit    PT Treatment/Interventions  ADLs/Self Care Home Management;Therapeutic exercise;Patient/family education    PT Next Visit Plan  Will reassess if MD refers post operatively    PT Home  Exercise Plan  Post op shoulder ROM HEP    Consulted and Agree with Plan of Care  Patient;Family member/caregiver    Family Member Consulted  daughter       Patient will benefit from skilled therapeutic intervention in order to improve the following deficits and impairments:  Postural dysfunction, Decreased range of motion, Decreased knowledge of precautions, Impaired UE functional use, Pain  Visit Diagnosis: Malignant neoplasm of upper-outer quadrant of left breast in female, estrogen receptor negative ([place]) - Plan: PT plan of care cert/re-cert  Abnormal posture - Plan: PT plan of care cert/re-cert   Patient will follow up at outpatient cancer rehab 3-4 weeks following surgery.  If the patient requires physical therapy at that time, a specific plan will be dictated and sent to the referring physician for approval. The patient was educated today on appropriate basic range of motion exercises to begin post operatively and the importance of attending the After Breast Cancer class following surgery.  Patient was educated today on lymphedema risk reduction practices as it pertains to recommendations that will benefit the patient  immediately following surgery.  She verbalized good understanding.     Problem List Patient Active Problem List   Diagnosis Date Noted  . Malignant neoplasm of upper-outer quadrant of left breast in female, estrogen receptor negative ([place]) 09/30/2019  . Breast mass, left 08/13/2019  . Trapezius muscle spasm 08/09/2019  . Asthma 09/12/2012  . HTN (hypertension) 09/12/2012  . Hypothyroidism   . Obesity    Annia Friendly, [place] 10/02/19 11:03 AM  [place], [place], [address] Phone: [phone]   Fax:  [phone]  Name: SHAWNTELL DIXSON MRN: [id number] Date of Birth: [birth year]

## 2019-10-02 NOTE — Patient Instructions (Signed)

## 2019-10-02 NOTE — Progress Notes (Signed)
START OFF PATHWAY REGIMEN - Breast   [id number]:Paclitaxel + Trastuzumab:   A cycle is every 28 days:     Paclitaxel      Trastuzumab-xxxx      Trastuzumab-xxxx   **Always confirm dose/schedule in your pharmacy ordering system**  Patient Characteristics: Preoperative or Nonsurgical Candidate (Clinical Staging), Neoadjuvant Therapy followed by Surgery, Invasive Disease, Chemotherapy, HER2 Positive, ER Negative/Unknown Therapeutic Status: Preoperative or Nonsurgical Candidate (Clinical Staging) AJCC M Category: cM0 AJCC Grade: G2 Breast Surgical Plan: Neoadjuvant Therapy followed by Surgery ER Status: Negative (-) AJCC 8 Stage Grouping: IIA HER2 Status: Positive (+) AJCC T Category: cT2 AJCC N Category: cN0 PR Status: Negative (-) Intent of Therapy: Curative Intent, Discussed with Patient

## 2019-10-03 ENCOUNTER — Encounter: Payer: Self-pay | Admitting: Radiation Oncology

## 2019-10-08 ENCOUNTER — Other Ambulatory Visit: Payer: Self-pay

## 2019-10-08 ENCOUNTER — Telehealth: Payer: Self-pay | Admitting: *Deleted

## 2019-10-08 ENCOUNTER — Encounter (HOSPITAL_BASED_OUTPATIENT_CLINIC_OR_DEPARTMENT_OTHER): Payer: Self-pay | Admitting: Surgery

## 2019-10-08 NOTE — Progress Notes (Signed)
Spoke with patients daughter Lilyan Punt who stated she will interpret for the patient, her mother, on day of surgery.  She is aware we provide interpreters and that she will be asked to sign a waiver for an interpreter.

## 2019-10-08 NOTE — Telephone Encounter (Signed)
Attempted to call pt to discuss bmdc from 10/02/19. Mail box full and unable to leave msg. Will try again.

## 2019-10-10 ENCOUNTER — Telehealth: Payer: Self-pay | Admitting: Hematology and Oncology

## 2019-10-10 ENCOUNTER — Encounter (HOSPITAL_BASED_OUTPATIENT_CLINIC_OR_DEPARTMENT_OTHER)
Admission: RE | Admit: 2019-10-10 | Discharge: 2019-10-10 | Disposition: A | Discharge status: Home / Self Care | Payer: Managed Care, Other (non HMO) | Source: Ambulatory Visit | Attending: Surgery | Admitting: Surgery

## 2019-10-10 ENCOUNTER — Other Ambulatory Visit: Payer: Self-pay

## 2019-10-10 DIAGNOSIS — Z01812 Encounter for preprocedural laboratory examination: Secondary | ICD-10-CM | POA: Diagnosis present

## 2019-10-10 NOTE — Progress Notes (Signed)

## 2019-10-10 NOTE — Telephone Encounter (Signed)
Scheduled appt per 12/29 sch message - pt to get an updated schedule next visit.at chemo edu

## 2019-10-12 ENCOUNTER — Other Ambulatory Visit (HOSPITAL_COMMUNITY)
Admission: RE | Admit: 2019-10-12 | Discharge: 2019-10-12 | Disposition: A | Discharge status: Home / Self Care | Payer: BC Managed Care – PPO | Source: Ambulatory Visit | Attending: Hematology and Oncology | Admitting: Hematology and Oncology

## 2019-10-12 DIAGNOSIS — Z01812 Encounter for preprocedural laboratory examination: Secondary | ICD-10-CM | POA: Diagnosis not present

## 2019-10-12 DIAGNOSIS — Z20822 Contact with and (suspected) exposure to covid-19: Secondary | ICD-10-CM | POA: Insufficient documentation

## 2019-10-12 LAB — SARS CORONAVIRUS 2 (TAT 6-24 HRS): SARS Coronavirus 2: NEGATIVE

## 2019-10-14 ENCOUNTER — Encounter (HOSPITAL_COMMUNITY): Payer: Self-pay

## 2019-10-14 ENCOUNTER — Other Ambulatory Visit: Payer: Self-pay

## 2019-10-14 ENCOUNTER — Ambulatory Visit (HOSPITAL_COMMUNITY)
Admission: RE | Admit: 2019-10-14 | Discharge: 2019-10-14 | Disposition: A | Discharge status: Home / Self Care | Payer: BC Managed Care – PPO | Source: Ambulatory Visit | Attending: Hematology and Oncology | Admitting: Hematology and Oncology

## 2019-10-14 ENCOUNTER — Inpatient Hospital Stay: Payer: BC Managed Care – PPO | Attending: Hematology and Oncology

## 2019-10-14 ENCOUNTER — Other Ambulatory Visit: Payer: Managed Care, Other (non HMO)

## 2019-10-14 DIAGNOSIS — C50412 Malignant neoplasm of upper-outer quadrant of left female breast: Secondary | ICD-10-CM | POA: Diagnosis not present

## 2019-10-14 DIAGNOSIS — Z171 Estrogen receptor negative status [ER-]: Secondary | ICD-10-CM | POA: Diagnosis not present

## 2019-10-14 DIAGNOSIS — Z01810 Encounter for preprocedural cardiovascular examination: Secondary | ICD-10-CM | POA: Insufficient documentation

## 2019-10-14 DIAGNOSIS — F419 Anxiety disorder, unspecified: Secondary | ICD-10-CM | POA: Insufficient documentation

## 2019-10-14 DIAGNOSIS — I10 Essential (primary) hypertension: Secondary | ICD-10-CM | POA: Diagnosis not present

## 2019-10-14 DIAGNOSIS — Z79899 Other long term (current) drug therapy: Secondary | ICD-10-CM | POA: Insufficient documentation

## 2019-10-14 DIAGNOSIS — Z5111 Encounter for antineoplastic chemotherapy: Secondary | ICD-10-CM | POA: Insufficient documentation

## 2019-10-14 MED ORDER — GADOBUTROL 1 MMOL/ML IV SOLN: 10.0000 mL | Freq: Once | INTRAVENOUS | Status: DC | PRN

## 2019-10-14 NOTE — Progress Notes (Signed)
RN received called from [place], pt unable to get MRI at facility, will need to be rescheduled at [place].  New order placed.

## 2019-10-14 NOTE — H&P (Signed)
Veronica Velazquez  Location: [facility] Patient #: [id number] DOB: 05-08-1966 Undefined / Language: Cleophus Velazquez / Race: Refused to Report/Unreported Female  History of Present Illness   The patient is a 54 year old female who presents with a complaint of breast cancer.  The PCP is Dr. Mitchel Honour [facility])  The patient was referred by Dr. Maylon Cos  The pateint is at the Breast [facility] - Oncology is Drs. Lindi Adie and Isidore Moos  She does not speak much [place]. Her daughter, Espyn Radwan, is there with her and acted as Optometrist. [name]'s phone - [phone]  Marcelline Deist Covid-19 virus has disrupted normal medical care in [place] and across the nation. We have sometimes had to alter normal surgical/medical care to limit this epidemic and we have explained these changes to the patient.]  This was her first mammogram, prompted by feeling a mass in her left breast.  Mammograms: The [place] - 09/16/2019 - 2.3 x 1.9 x 2.3 cm mass with internal calcifications at the 2 o'clock position of the left breast, axilla negative Biopsy: left breast biopsy at 2 o'clock - 09/24/2019 ([id number]) - IDC, grade 3, ER - 0%, PR - 0%, Ki67 - 40%, Her2Neu - POSITIVE Family history of breast or ovarian cancer: None On hormone therapy: No  I discussed the options for breast cancer treatment with the patient. The patient is at the [facility], which includes medical oncology and radiation oncology. I discussed the surgical options of lumpectomy vs. mastectomy. If mastectomy, there is the possibility of reconstruction. I discussed the options of lymph node biopsy. The treatment plan depends on the pathologic staging of the tumor and the patient's personal wishes. The risks of surgery include, but are not limited to, bleeding, infection, the need for further surgery, and nerve injury. The patient has been given literature on the  treatment of breast cancer.  I discussed the indications and potential complications of the power port placement. The primary complications of the power port, include, but are not limited to, bleeding, infection, nerve injury, thrombosis, and pneumothorax.  Plan: 1. Power port placement, 2. MRI of breasts 3. Neoadjuvant chemotherapy, 4. Probable left lumpectomy and left axillary SLNBx, 5. Radiation therapy  Past Medical History: 1. Left breast cancer 2. Asthma 3. Morbid obesity 4. History of depression 5. HTN 6. On thyroid med 7. She saw Dr. Einar Gip several years ago after her brother had an MI She had no significant card issues.  Social History: Married. Her husband works at She has 3 children: Her daughter, Veronica Velazquez, 87 yo, is there with her and acted as Optometrist. [name]'s phone - [phone]. 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  Veronica Velazquez - 54 yo in [place] - 54 yo in [place] - she lives at home with her parents. she does not work  Past Surgical History [name], RN; 10/02/2019 7:25 AM) Thyroid Surgery   Diagnostic Studies History [name], RN; 10/02/2019 7:25 AM) Mammogram  within last year  Medication History [name], RN; 10/02/2019 7:26 AM) Medications Reconciled  Social History [name], RN; 10/02/2019 7:25 AM) Caffeine use  Tea. No alcohol use  No drug use  Tobacco use  Never smoker.  Family History [name], RN; 10/02/2019 7:25 AM) Heart Disease  Brother.  Pregnancy / Birth History [name], RN; 10/02/2019 7:25 AM) Age at menarche  42 years. Age of menopause  <43 Gravida  5 Para  3 Regular periods   Other Problems Veronica Spillers [place],  RN; 10/02/2019 7:25 AM) Anxiety Disorder  Depression  Gastroesophageal Reflux Disease  High blood pressure  Thyroid Disease     Review of Systems Veronica Spillers [name] RN; 10/02/2019 7:25  AM) General Not Present- Appetite Loss, Chills, Fatigue, Fever, Night Sweats, Weight Gain and Weight Loss. Skin Not Present- Change in Wart/Mole, Dryness, Hives, Jaundice, New Lesions, Non-Healing Wounds, Rash and Ulcer. Breast Present- Breast Mass and Breast Pain. Not Present- Nipple Discharge and Skin Changes. Cardiovascular Not Present- Chest Pain, Difficulty Breathing Lying Down, Leg Cramps, Palpitations, Rapid Heart Rate, Shortness of Breath and Swelling of Extremities. Gastrointestinal Not Present- Abdominal Pain, Bloating, Bloody Stool, Change in Bowel Habits, Chronic diarrhea, Constipation, Difficulty Swallowing, Excessive gas, Gets full quickly at meals, Hemorrhoids, Indigestion, Nausea, Rectal Pain and Vomiting. Female Genitourinary Not Present- Frequency, Nocturia, Painful Urination, Pelvic Pain and Urgency. Musculoskeletal Not Present- Back Pain, Joint Pain, Joint Stiffness, Muscle Pain, Muscle Weakness and Swelling of Extremities. Neurological Not Present- Decreased Memory, Fainting, Headaches, Numbness, Seizures, Tingling, Tremor, Trouble walking and Weakness. Psychiatric Present- Anxiety and Depression. Not Present- Bipolar, Change in Sleep Pattern, Fearful and Frequent crying. Hematology Not Present- Blood Thinners, Easy Bruising, Excessive bleeding, Gland problems, HIV and Persistent Infections.   Physical Exam  General: Older Obese [place] F who is alert and generally healthy appearing. She did say some words of English. She wore a mask. HEENT: Normal. Pupils equal.  Neck: Supple. No mass. No thyroid mass. Lymph Nodes: No supraclavicular or cervical nodes.  Lungs: Clear to auscultation and symmetric breath sounds. Heart: RRR. No murmur or rub.  Breast: Right - large and pendulous - no mass  Left - large and pendulous. She has an approximate 3.0 cm mass at 2 o'clock in her left breast  Abdomen: Soft. No mass. No tenderness. No hernia. Normal bowel sounds. No abdominal  scars. Rectal: Not done.  Extremities: Good strength and ROM in upper and lower extremities.  Neurologic: Grossly intact to motor and sensory function. Psychiatric: Has normal mood and affect. Behavior is normal.  Assessment & Plan  1.  MALIGNANT NEOPLASM OF LEFT BREAST, STAGE 1, ESTROGEN RECEPTOR NEGATIVE (C50.912)  Story: Left breast biopsy at 2 o'clock - 09/24/2019 ([id number]) - IDC, grade 3, ER - 0%, PR - 0%, Ki67 - 40%, Her2Neu - POSITIVE  Oncology - Lindi Adie and [place]  Plan:  1. Power port placement,   2. Neoadjuvant chemotherapy,  3.  MRI Addendum Note(Rickiya Picariello H. Lucia Gaskins MD; 10/14/2019 11:40 AM) from [facility]: RN received called from [place], pt unable to get MRI at facility, will need to be rescheduled at [place]. New order placed.  3. Probable left lumpectomy and left axillary SLNBx,  4. Radiation therapy  3. Asthma 4. Morbid obesity 5. History of depression 6. HTN 7. On thyroid med 8. She saw Dr. Einar Gip several years ago after her brother had an MI She had no significant card issues.  Alphonsa Overall, MD, [facility] Office phone:  [phone]

## 2019-10-14 NOTE — Anesthesia Preprocedure Evaluation (Addendum)
Anesthesia Evaluation  Patient identified by MRN, date of birth, ID band Patient awake    Reviewed: Allergy & Precautions, NPO status , Patient's Chart, lab work & pertinent test results, reviewed documented beta blocker date and time   History of Anesthesia Complications Negative for: history of anesthetic complications  Airway Mallampati: II  TM Distance: >3 FB Neck ROM: Full    Dental  (+) Edentulous Lower, Edentulous Upper   Pulmonary asthma ,    Pulmonary exam normal        Cardiovascular hypertension, Pt. on medications and Pt. on home beta blockers Normal cardiovascular exam     Neuro/Psych Depression negative neurological ROS     GI/Hepatic negative GI ROS, Neg liver ROS,   Endo/Other  Hypothyroidism Morbid obesity  Renal/GU negative Renal ROS  negative genitourinary   Musculoskeletal negative musculoskeletal ROS (+)   Abdominal   Peds  Hematology negative hematology ROS (+)   Anesthesia Other Findings Left breast ca  Reproductive/Obstetrics negative OB ROS                           Anesthesia Physical Anesthesia Plan  ASA: III  Anesthesia Plan: General   Post-op Pain Management:    Induction: Intravenous  PONV Risk Score and Plan: 3 and Treatment may vary due to age or medical condition, Ondansetron, Dexamethasone and Midazolam  Airway Management Planned: LMA  Additional Equipment: None  Intra-op Plan:   Post-operative Plan: Extubation in OR  Informed Consent: I have reviewed the patients History and Physical, chart, labs and discussed the procedure including the risks, benefits and alternatives for the proposed anesthesia with the patient or authorized representative who has indicated his/her understanding and acceptance.     Dental advisory given  Plan Discussed with: CRNA  Anesthesia Plan Comments:        Anesthesia Quick Evaluation

## 2019-10-14 NOTE — Progress Notes (Signed)
  Echocardiogram 2D Echocardiogram has been performed.  Darlina Sicilian M 10/14/2019, 1:10 PM

## 2019-10-14 NOTE — Progress Notes (Signed)
Attempted patient for Breast MRI, pt did not clear bore of scanner due to body habitus. Patient needs to be scheduled for Wide Bore scanner. bhj

## 2019-10-15 ENCOUNTER — Ambulatory Visit (HOSPITAL_COMMUNITY): Payer: BC Managed Care – PPO

## 2019-10-15 ENCOUNTER — Encounter (HOSPITAL_BASED_OUTPATIENT_CLINIC_OR_DEPARTMENT_OTHER)
Admission: RE | Disposition: A | Discharge status: Home / Self Care | Payer: Self-pay | Source: Home / Self Care | Attending: Surgery

## 2019-10-15 ENCOUNTER — Ambulatory Visit (HOSPITAL_BASED_OUTPATIENT_CLINIC_OR_DEPARTMENT_OTHER): Payer: BC Managed Care – PPO | Admitting: Anesthesiology

## 2019-10-15 ENCOUNTER — Encounter (HOSPITAL_BASED_OUTPATIENT_CLINIC_OR_DEPARTMENT_OTHER): Payer: Self-pay | Admitting: Surgery

## 2019-10-15 ENCOUNTER — Ambulatory Visit (HOSPITAL_BASED_OUTPATIENT_CLINIC_OR_DEPARTMENT_OTHER)
Admission: RE | Admit: 2019-10-15 | Discharge: 2019-10-15 | Disposition: A | Discharge status: Home / Self Care | Payer: BC Managed Care – PPO | Attending: Surgery | Admitting: Surgery

## 2019-10-15 ENCOUNTER — Encounter: Payer: Self-pay | Admitting: *Deleted

## 2019-10-15 DIAGNOSIS — K219 Gastro-esophageal reflux disease without esophagitis: Secondary | ICD-10-CM | POA: Diagnosis not present

## 2019-10-15 DIAGNOSIS — Z8249 Family history of ischemic heart disease and other diseases of the circulatory system: Secondary | ICD-10-CM | POA: Insufficient documentation

## 2019-10-15 DIAGNOSIS — F329 Major depressive disorder, single episode, unspecified: Secondary | ICD-10-CM | POA: Insufficient documentation

## 2019-10-15 DIAGNOSIS — Z6841 Body Mass Index (BMI) 40.0 and over, adult: Secondary | ICD-10-CM | POA: Insufficient documentation

## 2019-10-15 DIAGNOSIS — J45909 Unspecified asthma, uncomplicated: Secondary | ICD-10-CM | POA: Diagnosis not present

## 2019-10-15 DIAGNOSIS — C50412 Malignant neoplasm of upper-outer quadrant of left female breast: Secondary | ICD-10-CM | POA: Insufficient documentation

## 2019-10-15 DIAGNOSIS — Z95828 Presence of other vascular implants and grafts: Secondary | ICD-10-CM

## 2019-10-15 DIAGNOSIS — I10 Essential (primary) hypertension: Secondary | ICD-10-CM | POA: Insufficient documentation

## 2019-10-15 DIAGNOSIS — Z171 Estrogen receptor negative status [ER-]: Secondary | ICD-10-CM | POA: Diagnosis not present

## 2019-10-15 DIAGNOSIS — Z452 Encounter for adjustment and management of vascular access device: Secondary | ICD-10-CM | POA: Diagnosis not present

## 2019-10-15 DIAGNOSIS — C50912 Malignant neoplasm of unspecified site of left female breast: Secondary | ICD-10-CM | POA: Diagnosis not present

## 2019-10-15 DIAGNOSIS — Z419 Encounter for procedure for purposes other than remedying health state, unspecified: Secondary | ICD-10-CM

## 2019-10-15 DIAGNOSIS — E079 Disorder of thyroid, unspecified: Secondary | ICD-10-CM | POA: Diagnosis not present

## 2019-10-15 HISTORY — PX: PORTACATH PLACEMENT: SHX2246

## 2019-10-15 SURGERY — INSERTION, TUNNELED CENTRAL VENOUS DEVICE, WITH PORT
Anesthesia: General | Site: Chest | Laterality: Right

## 2019-10-15 MED ORDER — OXYCODONE HCL 5 MG PO TABS
5.0000 mg | ORAL_TABLET | Freq: Once | ORAL | Status: AC | PRN
  Administered 2019-10-15: 5 mg via ORAL

## 2019-10-15 MED ORDER — FENTANYL CITRATE (PF) 100 MCG/2ML IJ SOLN: 25.0000 ug | INTRAMUSCULAR | Status: DC | PRN

## 2019-10-15 MED ORDER — CEFAZOLIN SODIUM-DEXTROSE 2-3 GM-%(50ML) IV SOLR
INTRAVENOUS | Status: DC | PRN
  Administered 2019-10-15: 2 g via INTRAVENOUS

## 2019-10-15 MED ORDER — OXYCODONE HCL 5 MG/5ML PO SOLN: 5.0000 mg | Freq: Once | ORAL | Status: AC | PRN

## 2019-10-15 MED ORDER — HEPARIN (PORCINE) IN NACL 2-0.9 UNITS/ML
INTRAMUSCULAR | Status: AC | PRN
  Administered 2019-10-15: 500 mL via INTRAVENOUS

## 2019-10-15 MED ORDER — HEPARIN SOD (PORK) LOCK FLUSH 100 UNIT/ML IV SOLN
INTRAVENOUS | Status: DC | PRN
  Administered 2019-10-15: 300 [IU]

## 2019-10-15 MED ORDER — CEFAZOLIN SODIUM-DEXTROSE 2-4 GM/100ML-% IV SOLN: 2.0000 g | INTRAVENOUS | Status: DC

## 2019-10-15 MED ORDER — PROMETHAZINE HCL 25 MG/ML IJ SOLN: 6.2500 mg | INTRAMUSCULAR | Status: DC | PRN

## 2019-10-15 MED ORDER — ACETAMINOPHEN 500 MG PO TABS: 1000.0000 mg | ORAL_TABLET | ORAL | Status: DC

## 2019-10-15 MED ORDER — CEFAZOLIN SODIUM-DEXTROSE 2-4 GM/100ML-% IV SOLN
INTRAVENOUS | Status: AC
  Filled 2019-10-15: qty 100

## 2019-10-15 MED ORDER — PROPOFOL 10 MG/ML IV BOLUS
INTRAVENOUS | Status: AC
  Filled 2019-10-15: qty 40

## 2019-10-15 MED ORDER — LACTATED RINGERS IV SOLN: INTRAVENOUS | Status: DC | PRN

## 2019-10-15 MED ORDER — LIDOCAINE HCL (CARDIAC) PF 100 MG/5ML IV SOSY
PREFILLED_SYRINGE | INTRAVENOUS | Status: DC | PRN
  Administered 2019-10-15: 100 mg via INTRAVENOUS

## 2019-10-15 MED ORDER — ONDANSETRON HCL 4 MG/2ML IJ SOLN
INTRAMUSCULAR | Status: AC
  Filled 2019-10-15: qty 2

## 2019-10-15 MED ORDER — LACTATED RINGERS IV SOLN: INTRAVENOUS | Status: DC

## 2019-10-15 MED ORDER — FENTANYL CITRATE (PF) 100 MCG/2ML IJ SOLN
INTRAMUSCULAR | Status: AC
  Filled 2019-10-15: qty 2

## 2019-10-15 MED ORDER — GLYCOPYRROLATE 0.2 MG/ML IJ SOLN
INTRAMUSCULAR | Status: DC | PRN
  Administered 2019-10-15: 22.82 ug via INTRAVENOUS

## 2019-10-15 MED ORDER — ONDANSETRON HCL 4 MG/2ML IJ SOLN
INTRAMUSCULAR | Status: DC | PRN
  Administered 2019-10-15: 4 mg via INTRAVENOUS

## 2019-10-15 MED ORDER — DEXAMETHASONE SODIUM PHOSPHATE 10 MG/ML IJ SOLN
INTRAMUSCULAR | Status: AC
  Filled 2019-10-15: qty 1

## 2019-10-15 MED ORDER — PROPOFOL 10 MG/ML IV BOLUS
INTRAVENOUS | Status: DC | PRN
  Administered 2019-10-15: 150 mg via INTRAVENOUS

## 2019-10-15 MED ORDER — CHLORHEXIDINE GLUCONATE CLOTH 2 % EX PADS: 6.0000 | MEDICATED_PAD | Freq: Once | CUTANEOUS | Status: DC

## 2019-10-15 MED ORDER — MIDAZOLAM HCL 2 MG/2ML IJ SOLN
INTRAMUSCULAR | Status: AC
  Filled 2019-10-15: qty 2

## 2019-10-15 MED ORDER — MIDAZOLAM HCL 2 MG/2ML IJ SOLN
INTRAMUSCULAR | Status: DC | PRN
  Administered 2019-10-15: 2 mg via INTRAVENOUS

## 2019-10-15 MED ORDER — EPHEDRINE SULFATE 50 MG/ML IJ SOLN
INTRAMUSCULAR | Status: DC | PRN
  Administered 2019-10-15: 10 mg via INTRAVENOUS
  Administered 2019-10-15 (×3): 15 mg via INTRAVENOUS
  Administered 2019-10-15: 5 mg via INTRAVENOUS
  Administered 2019-10-15: 10 mg via INTRAVENOUS

## 2019-10-15 MED ORDER — OXYCODONE HCL 5 MG PO TABS
ORAL_TABLET | ORAL | Status: AC
  Filled 2019-10-15: qty 1

## 2019-10-15 MED ORDER — ACETAMINOPHEN 500 MG PO TABS
1000.0000 mg | ORAL_TABLET | Freq: Once | ORAL | Status: AC
  Administered 2019-10-15: 07:00:00 1000 mg via ORAL

## 2019-10-15 MED ORDER — LIDOCAINE 2% (20 MG/ML) 5 ML SYRINGE
INTRAMUSCULAR | Status: AC
  Filled 2019-10-15: qty 5

## 2019-10-15 MED ORDER — ACETAMINOPHEN 500 MG PO TABS
ORAL_TABLET | ORAL | Status: AC
  Filled 2019-10-15: qty 2

## 2019-10-15 MED ORDER — TRAMADOL HCL 50 MG PO TABS: 50.0000 mg | ORAL_TABLET | Freq: Four times a day (QID) | ORAL | 0 refills | Status: DC | PRN

## 2019-10-15 MED ORDER — BUPIVACAINE HCL 0.25 % IJ SOLN
INTRAMUSCULAR | Status: DC | PRN
  Administered 2019-10-15: 10 mL

## 2019-10-15 MED ORDER — FENTANYL CITRATE (PF) 100 MCG/2ML IJ SOLN
INTRAMUSCULAR | Status: DC | PRN
  Administered 2019-10-15: 25 ug via INTRAVENOUS
  Administered 2019-10-15: 50 ug via INTRAVENOUS
  Administered 2019-10-15: 25 ug via INTRAVENOUS

## 2019-10-15 SURGICAL SUPPLY — 49 items
BAG DECANTER FOR FLEXI CONT (MISCELLANEOUS) ×3 IMPLANT
BENZOIN TINCTURE PRP APPL 2/3 (GAUZE/BANDAGES/DRESSINGS) ×3 IMPLANT
BLADE SURG 15 STRL LF DISP TIS (BLADE) ×1 IMPLANT
BLADE SURG 15 STRL SS (BLADE) ×2
CHLORAPREP W/TINT 26 (MISCELLANEOUS) ×6 IMPLANT
CLEANER CAUTERY TIP 5X5 PAD (MISCELLANEOUS) ×1 IMPLANT
CLOSURE WOUND 1/4X4 (GAUZE/BANDAGES/DRESSINGS) ×1
COVER BACK TABLE REUSABLE LG (DRAPES) ×3 IMPLANT
COVER MAYO STAND REUSABLE (DRAPES) ×3 IMPLANT
COVER PROBE 5X48 (MISCELLANEOUS) ×4
COVER WAND RF STERILE (DRAPES) IMPLANT
DECANTER SPIKE VIAL GLASS SM (MISCELLANEOUS) IMPLANT
DERMABOND ADVANCED (GAUZE/BANDAGES/DRESSINGS) ×2
DERMABOND ADVANCED .7 DNX12 (GAUZE/BANDAGES/DRESSINGS) ×1 IMPLANT
DRAPE C-ARM 42X72 X-RAY (DRAPES) ×3 IMPLANT
DRAPE LAPAROSCOPIC ABDOMINAL (DRAPES) ×3 IMPLANT
DRAPE UTILITY XL STRL (DRAPES) ×3 IMPLANT
ELECT REM PT RETURN 9FT ADLT (ELECTROSURGICAL) ×3
ELECTRODE REM PT RTRN 9FT ADLT (ELECTROSURGICAL) ×1 IMPLANT
GAUZE SPONGE 4X4 12PLY STRL (GAUZE/BANDAGES/DRESSINGS) ×6 IMPLANT
GAUZE SPONGE 4X4 12PLY STRL LF (GAUZE/BANDAGES/DRESSINGS) ×6 IMPLANT
GLOVE SURG SYN 7.5  E (GLOVE) ×2
GLOVE SURG SYN 7.5 E (GLOVE) ×1 IMPLANT
GOWN STRL REUS W/ TWL LRG LVL3 (GOWN DISPOSABLE) ×1 IMPLANT
GOWN STRL REUS W/ TWL XL LVL3 (GOWN DISPOSABLE) ×1 IMPLANT
GOWN STRL REUS W/TWL LRG LVL3 (GOWN DISPOSABLE) ×2
GOWN STRL REUS W/TWL XL LVL3 (GOWN DISPOSABLE) ×2
IV CATH AUTO 14GX1.75 SAFE ORG (IV SOLUTION) IMPLANT
IV CATH PLACEMENT UNIT 16 GA (IV SOLUTION) IMPLANT
IV CONNECTOR ONE LINK NDLESS (IV SETS) ×3 IMPLANT
IV KIT MINILOC 20X1 SAFETY (NEEDLE) ×3 IMPLANT
KIT CVR 48X5XPRB PLUP LF (MISCELLANEOUS) ×2 IMPLANT
KIT PORT POWER 8FR ISP CVUE (Port) ×3 IMPLANT
NEEDLE HYPO 25X1 1.5 SAFETY (NEEDLE) ×3 IMPLANT
PACK BASIN DAY SURGERY FS (CUSTOM PROCEDURE TRAY) ×3 IMPLANT
PAD CLEANER CAUTERY TIP 5X5 (MISCELLANEOUS) ×2
PENCIL SMOKE EVACUATOR (MISCELLANEOUS) ×3 IMPLANT
SET SHEATH INTRODUCER 10FR (MISCELLANEOUS) IMPLANT
SHEATH COOK PEEL AWAY SET 9F (SHEATH) IMPLANT
SLEEVE SCD COMPRESS KNEE MED (MISCELLANEOUS) ×3 IMPLANT
STAPLER VISISTAT 35W (STAPLE) ×3 IMPLANT
STRIP CLOSURE SKIN 1/4X4 (GAUZE/BANDAGES/DRESSINGS) ×2 IMPLANT
SUT ETHILON 2 0 FS 18 (SUTURE) IMPLANT
SUT ETHILON 3 0 PS 1 (SUTURE) IMPLANT
SUT MNCRL AB 4-0 PS2 18 (SUTURE) ×3 IMPLANT
SUT VICRYL 3-0 CR8 SH (SUTURE) ×3 IMPLANT
SYR 5ML LUER SLIP (SYRINGE) ×3 IMPLANT
SYR CONTROL 10ML LL (SYRINGE) ×3 IMPLANT
TOWEL GREEN STERILE FF (TOWEL DISPOSABLE) ×3 IMPLANT

## 2019-10-15 NOTE — Transfer of Care (Signed)
Immediate Anesthesia Transfer of Care Note  Patient: Veronica Velazquez  Procedure(s) Performed: INSERTION PORT-A-CATH WITH ULTRASOUND (Right Chest)  Patient Location: PACU  Anesthesia Type:General  Level of Consciousness: awake, alert  and oriented  Airway & Oxygen Therapy: Patient Spontanous Breathing and Patient connected to face mask oxygen  Post-op Assessment: report given, stable  Post vital signs: Reviewed and stable  Last Vitals:  Vitals Value Taken Time  BP    Temp    Pulse 65 10/15/19 0900  Resp    SpO2 100 % 10/15/19 0900  Vitals shown include unvalidated device data.  Last Pain:  Vitals:   10/15/19 0652  TempSrc: Oral  PainSc: 0-No pain         Complications: No apparent anesthesia complications

## 2019-10-15 NOTE — Interval H&P Note (Signed)
History and Physical Interval Note:  10/15/2019 7:22 AM  Veronica Velazquez  has presented today for surgery, with the diagnosis of LEFT BREAST CANCER.  The various methods of treatment have been discussed with the patient and family. The daughter is at the bedside.  She is to start chemotherapy tomorrow.  I will leave the port accessed.   After consideration of risks, benefits and other options for treatment, the patient has consented to  Procedure(s): INSERTION PORT-A-CATH WITH ULTRASOUND (N/A) as a surgical intervention.  The patient's history has been reviewed, patient examined, no change in status, stable for surgery.  I have reviewed the patient's chart and labs.  Questions were answered to the patient's satisfaction.     Shann Medal

## 2019-10-15 NOTE — Discharge Instructions (Signed)
[facility] - DISCHARGE INSTRUCTIONS TO PATIENT  Activity:  Driving - May drive in 2 or 3 days                       Practice your Covid-19 protection:  Wear a mask, social distance, and wash your hands frequently  Wound Care:   Leave the incision dry for 2 days, then you may shower  Diet:  As tolerated  Follow up appointment:  Call Dr. Pollie Friar office [facility]) at [phone] for an appointment in 2 months for follow up of the breast cancer.  Medications and dosages:  Resume your home medications.  You have a prescription for:  Ultram  Call Dr. Lucia Gaskins or his office  [phone]) if you have:  Temperature greater than 100.4,  Persistent nausea and vomiting,  Severe uncontrolled pain,  Redness, tenderness, or signs of infection (pain, swelling, redness, odor or green/yellow discharge around the site),  Difficulty breathing, headache or visual disturbances,  Any other questions or concerns you may have after discharge.  In an emergency, call 911 or go to an Emergency Department at a nearby hospital.   Post Anesthesia Home Care Instructions  Activity: Get plenty of rest for the remainder of the day. A responsible individual must stay with you for 24 hours following the procedure.  For the next 24 hours, DO NOT: -Drive a car -Paediatric nurse -Drink alcoholic beverages -Take any medication unless instructed by your physician -Make any legal decisions or sign important papers.  Meals: Start with liquid foods such as gelatin or soup. Progress to regular foods as tolerated. Avoid greasy, spicy, heavy foods. If nausea and/or vomiting occur, drink only clear liquids until the nausea and/or vomiting subsides. Call your physician if vomiting continues.  Special Instructions/Symptoms: Your throat may feel dry or sore from the anesthesia or the breathing tube placed in your throat during surgery. If this causes discomfort, gargle with warm salt  water. The discomfort should disappear within 24 hours.  If you had a scopolamine patch placed behind your ear for the management of post- operative nausea and/or vomiting:  1. The medication in the patch is effective for 72 hours, after which it should be removed.  Wrap patch in a tissue and discard in the trash. Wash hands thoroughly with soap and water. 2. You may remove the patch earlier than 72 hours if you experience unpleasant side effects which may include dry mouth, dizziness or visual disturbances. 3. Avoid touching the patch. Wash your hands with soap and water after contact with the patch.

## 2019-10-15 NOTE — Op Note (Signed)
10/15/2019  9:11 AM  PATIENT:  Veronica Velazquez, 54 y.o., female MRN: [id number] DOB: Sep 09, 1966  PREOP DIAGNOSIS:  LEFT BREAST CANCER, anticipate chemotherapy  POSTOP DIAGNOSIS:   Left breast cancer, anticipate chemotherapy  PROCEDURE:   Procedure(s):  Left internal jugular vein INSERTION PORT-A-CATH WITH ULTRASOUND guidance  SURGEON:   Alphonsa Overall, M.D.  ANESTHESIA:   general  Anesthesiologist: Brennan Bailey, MD CRNA: Verita Lamb, CRNA  General  EBL:  minimal  ml  COUNTS CORRECT:  YES  INDICATIONS FOR PROCEDURE:  Veronica Velazquez is a 54 y.o. (DOB: May 10, 1966) [place] female whose primary care physician is Sagardia, Ines Bloomer, MD and comes for power port placement for the treatment of left breast cancer.  Dr. Lindi Adie is her treating oncologist.   The indications and risks of the surgery were explained to the patient.  The risks include, but are not limited to, infection, bleeding, pneumothorax, nerve injury, and thrombosis of the vein.  OPERATIVE NOTE:  The patient was taken to OR room #1 at [facility].  Anesthesia was provided by Anesthesiologist: Brennan Bailey, MD CRNA: Verita Lamb, CRNA.  At the beginning of the operation, the patient was given 2 gm Ancef, had a roll placed under her back, and had the upper chest/neck prepped with Chloroprep and draped.   A time out was held and the surgery checklist reviewed.   The patient was placed in Trendelenburg position. I first accessed her left subclavian vein - but the guide wire would only thread to the right subclavian vein.  So I pulled the wire and went for the right internal jugular vein.  The right internal jugular vein was accessed with a 16 gauge needle using US guidance and a guide wire threaded through the needle into the vein.  The position of the wire was checked with fluoroscopy.   I then developed a pocket in the upper inner aspect of the right chest for the port reservoir.  I used the [employer] for venous access.  The reservoir was sewn in place with a 3-0 Vicryl suture.  The reservoir had been flushed with dilute (10 units/cc) heparin.   I then passed the silastic tubing from the reservoir incision to the subclavian incision and into the right internal jugular vein using the 8 French introducer to pass it into the vein.  The tip of the silastic catheter was position at the junction of the SVC and the right atrium under fluoroscopy.  The silastic catheter was then attached to the port with the bayonet device.     The patient starts chemotherapy tomorrow and I left the port accessed.  The entire port and tubing were checked with fluoroscopy.  I then left a right angle huber needle in the port and the port was flushed with 4 cc of concentrated heparin (100 units/cc).  I injected 10 cc of 1/4% marcaine in the wounds.   The wounds were then closed with 3-0 vicryl subcutaneous sutures and the skin closed with a 4-0 Monocryl suture.  The skin was painted with DermaBond.  The port site was painted with tincture of Benzoin and 1/4 inch Steristrips placed.   The patient was transferred to the recovery room in good condition.  The sponge and needle count were correct at the end of the case.  A CXR is ordered for port placement and pending at the time of this note.  Alphonsa Overall, MD, [facility] Office phone:  [phone]

## 2019-10-15 NOTE — Anesthesia Postprocedure Evaluation (Signed)
Anesthesia Post Note  Patient: Veronica Velazquez  Procedure(s) Performed: INSERTION PORT-A-CATH WITH ULTRASOUND (Right Chest)     Patient location during evaluation: PACU Anesthesia Type: General Level of consciousness: awake and alert and oriented Pain management: pain level controlled Vital Signs Assessment: post-procedure vital signs reviewed and stable Respiratory status: spontaneous breathing, nonlabored ventilation and respiratory function stable Cardiovascular status: blood pressure returned to baseline Postop Assessment: no apparent nausea or vomiting Anesthetic complications: no    Last Vitals:  Vitals:   10/15/19 0915 10/15/19 0930  BP: 126/82 131/87  Pulse: 65 63  Resp: 17 14  Temp:    SpO2: 100% 94%    Last Pain:  Vitals:   10/15/19 0945  TempSrc:   PainSc: [place]

## 2019-10-15 NOTE — Anesthesia Procedure Notes (Signed)
Procedure Name: LMA Insertion Performed by: Donaldo Teegarden M, CRNA Pre-anesthesia Checklist: Patient identified, Emergency Drugs available, Suction available and Patient being monitored Patient Re-evaluated:Patient Re-evaluated prior to induction Oxygen Delivery Method: Circle system utilized Preoxygenation: Pre-oxygenation with 100% oxygen Induction Type: IV induction Ventilation: Mask ventilation without difficulty LMA: LMA inserted LMA Size: 4.0 Tube type: Oral Number of attempts: 1 Airway Equipment and Method: Bite block Placement Confirmation: positive ETCO2,  breath sounds checked- equal and bilateral and CO2 detector Tube secured with: Tape Dental Injury: Teeth and Oropharynx as per pre-operative assessment        

## 2019-10-15 NOTE — Progress Notes (Signed)
Patient Care Team: Horald Pollen, MD as PCP - General (Internal Medicine) Mauro Kaufmann, RN as Oncology Nurse Navigator Rockwell Germany, RN as Oncology Nurse Navigator Nicholas Lose, MD as Consulting Physician (Hematology and Oncology) Eppie Gibson, MD as Attending Physician (Radiation Oncology) Alphonsa Overall, MD as Consulting Physician (General Surgery)  DIAGNOSIS:    ICD-10-CM   1. Malignant neoplasm of upper-outer quadrant of left breast in female, estrogen receptor negative ([place])  C50.412    Z17.1     SUMMARY OF ONCOLOGIC HISTORY: Oncology History  Malignant neoplasm of upper-outer quadrant of left breast in female, estrogen receptor negative ([place])  09/30/2019 Initial Diagnosis   Patient palpated left breast mass. Mammogram showed a 2.3cm mass at the 2 o'clock position, normal-appearing axillary lymph nodes. Biopsy showed IDC, grade 3, HER-2 + (3+), ER/PR -, Ki67 40%.   10/02/2019 Cancer Staging   Staging form: Breast, AJCC 8th Edition - Clinical stage from 10/02/2019: Stage IIA (cT2, cN0, cM0, G3, ER-, PR-, HER2+) - Signed by Nicholas Lose, MD on 10/02/2019   10/15/2019 -  Chemotherapy   The patient had PACLitaxel (TAXOL) 180 mg in sodium chloride 0.9 % 250 mL chemo infusion (</= '80mg'$ /m2), 80 mg/m2 = 180 mg, Intravenous,  Once, 0 of 3 cycles trastuzumab-dkst (OGIVRI) 462 mg in sodium chloride 0.9 % 250 mL chemo infusion, 4 mg/kg = 462 mg, Intravenous,  Once, 0 of 3 cycles  for chemotherapy treatment.      CHIEF COMPLIANT: Cycle 1 Taxol Herceptin  INTERVAL HISTORY: Veronica Velazquez is a 54 y.o. with above-mentioned history of HER-2 positive left breast cancer who is currently on neoadjuvant chemotherapy treatment with weekly Taxol Herceptin. Echo on 10/13/18 showed an ejection fraction of 60-65%. Her port was placed by Dr. Lucia Gaskins on 10/15/19. She presents to the clinic today for cycle 1.   ALLERGIES:  has No Known Allergies.  MEDICATIONS:  Current Outpatient  Medications  Medication Sig Dispense Refill  . albuterol (PROVENTIL HFA;VENTOLIN HFA) 108 (90 Base) MCG/ACT inhaler Inhale 2 puffs into the lungs every 3 (three) hours as needed for wheezing (cough, shortness of breath or wheezing.). 1 Inhaler 3  . atenolol-chlorthalidone (TENORETIC) 100-25 MG tablet TAKE 1/2 TABLET BY MOUTH DAILY 45 tablet 0  . guaiFENesin-codeine (ROBITUSSIN AC) 100-10 MG/5ML syrup Take 5-10 mLs by mouth 4 (four) times daily as needed for cough. (Patient not taking: Reported on 10/02/2019) 180 mL 0  . ibuprofen (ADVIL) 800 MG tablet Take 1 tablet (800 mg total) by mouth every 8 (eight) hours as needed. (Patient not taking: Reported on 10/02/2019) 30 tablet 0  . levothyroxine (SYNTHROID) 100 MCG tablet Take 1 tablet (100 mcg total) by mouth daily. 30 tablet 1  . lidocaine-prilocaine (EMLA) cream Apply to affected area once 30 g 3  . lisinopril (ZESTRIL) 20 MG tablet TAKE 1 TABLET BY MOUTH EVERY DAY 90 tablet 0  . LORazepam (ATIVAN) 0.5 MG tablet Take 1 tablet (0.5 mg total) by mouth every 6 (six) hours as needed (Nausea or vomiting). 30 tablet 0  . methocarbamol (ROBAXIN) 500 MG tablet Take 1 tablet (500 mg total) by mouth 4 (four) times daily. (Patient not taking: Reported on 10/02/2019) 30 tablet 0  . ondansetron (ZOFRAN) 8 MG tablet Take 1 tablet (8 mg total) by mouth 2 (two) times daily as needed (Nausea or vomiting). 30 tablet 1  . prochlorperazine (COMPAZINE) 10 MG tablet Take 1 tablet (10 mg total) by mouth every 6 (six) hours as needed (Nausea or  vomiting). 30 tablet 1  . simvastatin (ZOCOR) 20 MG tablet TAKE 1 TABLET (20 MG TOTAL) BY MOUTH DAILY. NEEDS OFFICE VISIT FOR FURTHER REFILLS 90 tablet 0  . traMADol (ULTRAM) 50 MG tablet Take 1 tablet (50 mg total) by mouth every 6 (six) hours as needed for moderate pain or severe pain. 10 tablet 0   No current facility-administered medications for this visit.   Facility-Administered Medications Ordered in Other Visits    Medication Dose Route Frequency Provider Last Rate Last Admin  . sodium chloride flush (NS) 0.9 % injection 10 mL  10 mL Intravenous PRN Nicholas Lose, MD   10 mL at 10/16/19 0831    PHYSICAL EXAMINATION: ECOG PERFORMANCE STATUS: 1 - Symptomatic but completely ambulatory  There were no vitals filed for this visit. There were no vitals filed for this visit.  LABORATORY DATA:  I have reviewed the data as listed CMP Latest Ref Rng & Units 10/02/2019 08/13/2019 02/01/2018  Glucose 70 - 99 mg/dL 134(H) 107(H) 113(H)  BUN 6 - 20 mg/dL '18 13 9  '$ Creatinine 0.44 - 1.00 mg/dL 0.74 0.68 0.66  Sodium 135 - 145 mmol/L 137 136 137  Potassium 3.5 - 5.1 mmol/L 3.9 4.7 4.4  Chloride 98 - 111 mmol/L 99 96 99  CO2 22 - 32 mmol/L '28 24 24  '$ Calcium 8.9 - 10.3 mg/dL 9.4 9.7 9.8  Total Protein 6.5 - 8.1 g/dL 7.8 7.0 7.5  Total Bilirubin 0.3 - 1.2 mg/dL 0.5 0.3 0.4  Alkaline Phos 38 - 126 U/L 106 128(H) 109  AST 15 - 41 U/L 13(L) 15 16  ALT 0 - 44 U/L '12 11 13    '$ Lab Results  Component Value Date   WBC 9.2 10/16/2019   HGB 12.1 10/16/2019   HCT 39.2 10/16/2019   MCV 74.7 (L) 10/16/2019   PLT 377 10/16/2019   NEUTROABS 6.0 10/16/2019    ASSESSMENT & PLAN:  Malignant neoplasm of upper-outer quadrant of left breast in female, estrogen receptor negative ([place]) 09/30/2019: Patient palpated left breast mass. Mammogram showed a 2.3cm mass at the 2 o'clock position, normal-appearing axillary lymph nodes. Biopsy showed IDC, grade 3, HER-2 + (3+), ER/PR -, Ki67 40%. T2 N0 stage IIa clinical stage  Treatment plan: 1.  Neoadjuvant chemotherapy with Taxol-Herceptin weekly X 12 followed by Herceptin maintenance for 1 year versus Kadcyla maintenance depending on the final response 2.  Breast conserving surgery with sentinel lymph node biopsy 3.  Adjuvant  radiation ----------------------------------------------------------------------------------------------------------------------------------------------------- Breast MRI scheduled for 10/25/2019 Current treatment: Cycle 1 day 1 Taxol Herceptin Echocardiogram 10/14/2019: EF 60 to 65% Chemo education completed chemo consent obtained Labs have been reviewed  Anxiety related to chemotherapy Since the patient does not speak English she is very anxious about not having her daughter in the chemotherapy room. Return to clinic in 1 week for toxicity check    No orders of the defined types were placed in this encounter.  The patient has a good understanding of the overall plan. she agrees with it. she will call with any problems that may develop before the next visit here.  Total time spent: 30 mins including face to face time and time spent for planning, charting and coordination of care  Nicholas Lose, MD 10/16/2019  I, Cloyde Reams Dorshimer, am acting as scribe for Dr. Nicholas Lose.  I have reviewed the above documentation for accuracy and completeness, and I agree with the above.

## 2019-10-16 ENCOUNTER — Other Ambulatory Visit: Payer: Managed Care, Other (non HMO)

## 2019-10-16 ENCOUNTER — Inpatient Hospital Stay: Payer: BC Managed Care – PPO

## 2019-10-16 ENCOUNTER — Other Ambulatory Visit: Payer: Self-pay

## 2019-10-16 ENCOUNTER — Encounter: Payer: Self-pay | Admitting: *Deleted

## 2019-10-16 ENCOUNTER — Ambulatory Visit: Payer: Managed Care, Other (non HMO)

## 2019-10-16 ENCOUNTER — Inpatient Hospital Stay (HOSPITAL_BASED_OUTPATIENT_CLINIC_OR_DEPARTMENT_OTHER): Payer: BC Managed Care – PPO | Admitting: Hematology and Oncology

## 2019-10-16 VITALS — BP 114/71 | HR 65 | Temp 98.3°F | Resp 17

## 2019-10-16 DIAGNOSIS — Z95828 Presence of other vascular implants and grafts: Secondary | ICD-10-CM

## 2019-10-16 DIAGNOSIS — C50412 Malignant neoplasm of upper-outer quadrant of left female breast: Secondary | ICD-10-CM

## 2019-10-16 DIAGNOSIS — Z79899 Other long term (current) drug therapy: Secondary | ICD-10-CM | POA: Diagnosis not present

## 2019-10-16 DIAGNOSIS — Z171 Estrogen receptor negative status [ER-]: Secondary | ICD-10-CM

## 2019-10-16 DIAGNOSIS — Z5111 Encounter for antineoplastic chemotherapy: Secondary | ICD-10-CM | POA: Diagnosis not present

## 2019-10-16 DIAGNOSIS — F419 Anxiety disorder, unspecified: Secondary | ICD-10-CM | POA: Diagnosis not present

## 2019-10-16 LAB — CBC WITH DIFFERENTIAL (CANCER CENTER ONLY)
Abs Immature Granulocytes: 0.03 10*3/uL (ref 0.00–0.07)
Basophils Absolute: 0.1 10*3/uL (ref 0.0–0.1)
Basophils Relative: 1 %
Eosinophils Absolute: 0.2 10*3/uL (ref 0.0–0.5)
Eosinophils Relative: 2 %
HCT: 39.2 % (ref 36.0–46.0)
Hemoglobin: 12.1 g/dL (ref 12.0–15.0)
Immature Granulocytes: 0 %
Lymphocytes Relative: 25 %
Lymphs Abs: 2.3 10*3/uL (ref 0.7–4.0)
MCH: 23 pg — ABNORMAL LOW (ref 26.0–34.0)
MCHC: 30.9 g/dL (ref 30.0–36.0)
MCV: 74.7 fL — ABNORMAL LOW (ref 80.0–100.0)
Monocytes Absolute: 0.7 10*3/uL (ref 0.1–1.0)
Monocytes Relative: 8 %
Neutro Abs: 6 10*3/uL (ref 1.7–7.7)
Neutrophils Relative %: 64 %
Platelet Count: 377 10*3/uL (ref 150–400)
RBC: 5.25 MIL/uL — ABNORMAL HIGH (ref 3.87–5.11)
RDW: 15.9 % — ABNORMAL HIGH (ref 11.5–15.5)
WBC Count: 9.2 10*3/uL (ref 4.0–10.5)
nRBC: 0 % (ref 0.0–0.2)

## 2019-10-16 LAB — CMP (CANCER CENTER ONLY)
ALT: 10 U/L (ref 0–44)
AST: 13 U/L — ABNORMAL LOW (ref 15–41)
Albumin: 3.5 g/dL (ref 3.5–5.0)
Alkaline Phosphatase: 100 U/L (ref 38–126)
Anion gap: 12 (ref 5–15)
BUN: 17 mg/dL (ref 6–20)
CO2: 25 mmol/L (ref 22–32)
Calcium: 9.2 mg/dL (ref 8.9–10.3)
Chloride: 98 mmol/L (ref 98–111)
Creatinine: 0.8 mg/dL (ref 0.44–1.00)
GFR, Est AFR Am: >60 mL/min (ref >60)
GFR, Estimated: >60 mL/min (ref >60)
Glucose, Bld: 115 mg/dL — ABNORMAL HIGH (ref 70–99)
Potassium: 4.4 mmol/L (ref 3.5–5.1)
Sodium: 135 mmol/L (ref 135–145)
Total Bilirubin: 0.4 mg/dL (ref 0.3–1.2)
Total Protein: 7.3 g/dL (ref 6.5–8.1)

## 2019-10-16 MED ORDER — TRASTUZUMAB-DKST CHEMO 150 MG IV SOLR
450.0000 mg | Freq: Once | INTRAVENOUS | Status: AC
  Administered 2019-10-16: 450 mg via INTRAVENOUS
  Filled 2019-10-16: qty 21.43

## 2019-10-16 MED ORDER — ACETAMINOPHEN 325 MG PO TABS
650.0000 mg | ORAL_TABLET | Freq: Once | ORAL | Status: AC
  Administered 2019-10-16: 650 mg via ORAL

## 2019-10-16 MED ORDER — DIPHENHYDRAMINE HCL 50 MG/ML IJ SOLN
25.0000 mg | Freq: Once | INTRAMUSCULAR | Status: AC
  Administered 2019-10-16: 25 mg via INTRAVENOUS

## 2019-10-16 MED ORDER — ONDANSETRON HCL 4 MG/2ML IJ SOLN
8.0000 mg | Freq: Once | INTRAMUSCULAR | Status: AC
  Administered 2019-10-16: 8 mg via INTRAVENOUS

## 2019-10-16 MED ORDER — ACETAMINOPHEN 325 MG PO TABS
ORAL_TABLET | ORAL | Status: AC
  Filled 2019-10-16: qty 2

## 2019-10-16 MED ORDER — SODIUM CHLORIDE 0.9% FLUSH
10.0000 mL | INTRAVENOUS | Status: DC | PRN
  Administered 2019-10-16: 10 mL via INTRAVENOUS
  Filled 2019-10-16: qty 10

## 2019-10-16 MED ORDER — FAMOTIDINE IN NACL 20-0.9 MG/50ML-% IV SOLN
20.0000 mg | Freq: Once | INTRAVENOUS | Status: AC
  Administered 2019-10-16: 20 mg via INTRAVENOUS

## 2019-10-16 MED ORDER — SODIUM CHLORIDE 0.9 % IV SOLN
80.0000 mg/m2 | Freq: Once | INTRAVENOUS | Status: AC
  Administered 2019-10-16: 180 mg via INTRAVENOUS
  Filled 2019-10-16: qty 30

## 2019-10-16 MED ORDER — SODIUM CHLORIDE 0.9 % IV SOLN
Freq: Once | INTRAVENOUS | Status: AC
  Filled 2019-10-16: qty 250

## 2019-10-16 MED ORDER — ONDANSETRON HCL 4 MG/2ML IJ SOLN
INTRAMUSCULAR | Status: AC
  Filled 2019-10-16: qty 4

## 2019-10-16 MED ORDER — DIPHENHYDRAMINE HCL 25 MG PO CAPS
ORAL_CAPSULE | ORAL | Status: AC
  Filled 2019-10-16: qty 1

## 2019-10-16 MED ORDER — HEPARIN SOD (PORK) LOCK FLUSH 100 UNIT/ML IV SOLN
500.0000 [IU] | Freq: Once | INTRAVENOUS | Status: AC | PRN
  Administered 2019-10-16: 500 [IU]
  Filled 2019-10-16: qty 5

## 2019-10-16 MED ORDER — DEXAMETHASONE SODIUM PHOSPHATE 10 MG/ML IJ SOLN
INTRAMUSCULAR | Status: AC
  Filled 2019-10-16: qty 1

## 2019-10-16 MED ORDER — SODIUM CHLORIDE 0.9% FLUSH
10.0000 mL | INTRAVENOUS | Status: DC | PRN
  Administered 2019-10-16: 10 mL
  Filled 2019-10-16: qty 10

## 2019-10-16 MED ORDER — DIPHENHYDRAMINE HCL 50 MG/ML IJ SOLN
INTRAMUSCULAR | Status: AC
  Filled 2019-10-16: qty 1

## 2019-10-16 MED ORDER — SODIUM CHLORIDE 0.9 % IV SOLN: 10.0000 mg | Freq: Once | INTRAVENOUS | Status: DC

## 2019-10-16 MED ORDER — FAMOTIDINE IN NACL 20-0.9 MG/50ML-% IV SOLN
INTRAVENOUS | Status: AC
  Filled 2019-10-16: qty 50

## 2019-10-16 MED ORDER — DEXAMETHASONE SODIUM PHOSPHATE 10 MG/ML IJ SOLN
10.0000 mg | Freq: Once | INTRAMUSCULAR | Status: AC
  Administered 2019-10-16: 10:00:00 10 mg via INTRAVENOUS

## 2019-10-16 NOTE — Assessment & Plan Note (Signed)
09/30/2019: Patient palpated left breast mass. Mammogram showed a 2.3cm mass at the 2 o'clock position, normal-appearing axillary lymph nodes. Biopsy showed IDC, grade 3, HER-2 + (3+), ER/PR -, Ki67 40%. T2 N0 stage IIa clinical stage  Treatment plan: 1.  Neoadjuvant chemotherapy with Taxol-Herceptin weekly X 12 followed by Herceptin maintenance for 1 year versus Kadcyla maintenance depending on the final response 2.  Breast conserving surgery with sentinel lymph node biopsy 3.  Adjuvant radiation ----------------------------------------------------------------------------------------------------------------------------------------------------- Breast MRI scheduled for 10/25/2019 Current treatment: Cycle 1 day 1 Taxol Herceptin Echocardiogram 10/14/2019: EF 60 to 65% Chemo education completed chemo consent obtained Labs have been reviewed Return to clinic in 1 week for toxicity check

## 2019-10-16 NOTE — Patient Instructions (Signed)
[place] Discharge Instructions for Patients Receiving Chemotherapy  Today you received the following chemotherapy agents Paclitaxel/Trastuzumab   To help prevent nausea and vomiting after your treatment, we encourage you to take your nausea medication as directed.    If you develop nausea and vomiting that is not controlled by your nausea medication, call the clinic.   BELOW ARE SYMPTOMS THAT SHOULD BE REPORTED IMMEDIATELY:  *FEVER GREATER THAN 100.5 F  *CHILLS WITH OR WITHOUT FEVER  NAUSEA AND VOMITING THAT IS NOT CONTROLLED WITH YOUR NAUSEA MEDICATION  *UNUSUAL SHORTNESS OF BREATH  *UNUSUAL BRUISING OR BLEEDING  TENDERNESS IN MOUTH AND THROAT WITH OR WITHOUT PRESENCE OF ULCERS  *URINARY PROBLEMS  *BOWEL PROBLEMS  UNUSUAL RASH Items with * indicate a potential emergency and should be followed up as soon as possible.  Feel free to call the clinic you have any questions or concerns. The clinic phone number is [phone].

## 2019-10-17 ENCOUNTER — Telehealth: Payer: Self-pay

## 2019-10-17 NOTE — Telephone Encounter (Signed)
Pt's daughter called to report redness and minimal swelling to patient's face after first cycle of chemotherapy. Pt not having trouble breathing, or swallowing.  RN educated the redness to face is common after dexamethasone administration, encouraged use of Benadryl for minimal swelling to face.  Daughter verbalized understanding and agreement.  Will call back to clinic if medication if ineffective.

## 2019-10-18 ENCOUNTER — Telehealth: Payer: Self-pay | Admitting: Hematology and Oncology

## 2019-10-18 NOTE — Telephone Encounter (Signed)
Called pt daughter per 1/8 sch message - unable to reach . Left message for pt daughter to call back - if reschedule is still needed

## 2019-10-22 ENCOUNTER — Encounter: Payer: Self-pay | Admitting: Adult Health Nurse Practitioner

## 2019-10-22 ENCOUNTER — Telehealth: Payer: Self-pay | Admitting: Hematology and Oncology

## 2019-10-22 NOTE — Telephone Encounter (Signed)
Returned patient's phone call regarding rescheduling 02/03 appointment time, per patient's daughters request appointment time has been rescheduled.

## 2019-10-22 NOTE — Progress Notes (Signed)
Mamm[facility]: [facility]-09/16/2019-2.3x1.9x2.3cm mass with internal calcifications at the 2 o'clock position of the left breast, axilla negative.  Biopsy: Left biopsy at 2 o'clock-09/24/2019 ([id number])- IDC, grade 3,ER-0%, PR-0%, Ki67-40%, Her2Neu-Positive

## 2019-10-23 NOTE — Progress Notes (Signed)
**Note Veronica via Obfuscation** Patient Care Team: Horald Pollen, MD as PCP - General (Internal Medicine) Mauro Kaufmann, RN as Oncology Nurse Navigator Rockwell Germany, RN as Oncology Nurse Navigator Nicholas Lose, MD as Consulting Physician (Hematology and Oncology) Eppie Gibson, MD as Attending Physician (Radiation Oncology) Alphonsa Overall, MD as Consulting Physician (General Surgery)  DIAGNOSIS:    ICD-10-CM   1. Malignant neoplasm of upper-outer quadrant of left breast in female, estrogen receptor negative ([place])  C50.412    Z17.1     SUMMARY OF ONCOLOGIC HISTORY: Oncology History  Malignant neoplasm of upper-outer quadrant of left breast in female, estrogen receptor negative ([place])  09/30/2019 Initial Diagnosis   Patient palpated left breast mass. Mammogram showed a 2.3cm mass at the 2 o'clock position, normal-appearing axillary lymph nodes. Biopsy showed IDC, grade 3, HER-2 + (3+), ER/PR -, Ki67 40%.   10/02/2019 Cancer Staging   Staging form: Breast, AJCC 8th Edition - Clinical stage from 10/02/2019: Stage IIA (cT2, cN0, cM0, G3, ER-, PR-, HER2+) - Signed by Nicholas Lose, MD on 10/02/2019   10/16/2019 -  Chemotherapy   The patient had ondansetron (ZOFRAN) injection 8 mg, 8 mg (100 % of original dose 8 mg), Intravenous,  Once, 1 of 3 cycles Dose modification: 8 mg (original dose 8 mg, Cycle 1) Administration: 8 mg (10/16/2019) PACLitaxel (TAXOL) 180 mg in sodium chloride 0.9 % 250 mL chemo infusion (</= '80mg'$ /m2), 80 mg/m2 = 180 mg, Intravenous,  Once, 1 of 3 cycles Administration: 180 mg (10/16/2019) trastuzumab-dkst (OGIVRI) 450 mg in sodium chloride 0.9 % 250 mL chemo infusion, 462 mg, Intravenous,  Once, 1 of 3 cycles Administration: 450 mg (10/16/2019)  for chemotherapy treatment.      CHIEF COMPLIANT: Cycle 2 Taxol Herceptin  INTERVAL HISTORY: DEVYNN HESSLER is a 54 y.o. with above-mentioned history of HER-2 positive left breast cancer who is currently on neoadjuvant chemotherapy treatment  with weekly Taxol Herceptin. She presents to the clinic today for cycle 2 and a toxicity check.   Overall she tolerated cycle 1 extremely well.  She did have some aches and pains and mild fatigue but she was able to walk it off and did very well.  She did not have any nausea.  Did not have any diarrhea.  ALLERGIES:  has No Known Allergies.  MEDICATIONS:  Current Outpatient Medications  Medication Sig Dispense Refill  . albuterol (PROVENTIL HFA;VENTOLIN HFA) 108 (90 Base) MCG/ACT inhaler Inhale 2 puffs into the lungs every 3 (three) hours as needed for wheezing (cough, shortness of breath or wheezing.). 1 Inhaler 3  . atenolol-chlorthalidone (TENORETIC) 100-25 MG tablet TAKE 1/2 TABLET BY MOUTH DAILY 45 tablet 0  . guaiFENesin-codeine (ROBITUSSIN AC) 100-10 MG/5ML syrup Take 5-10 mLs by mouth 4 (four) times daily as needed for cough. (Patient not taking: Reported on 10/02/2019) 180 mL 0  . ibuprofen (ADVIL) 800 MG tablet Take 1 tablet (800 mg total) by mouth every 8 (eight) hours as needed. (Patient not taking: Reported on 10/02/2019) 30 tablet 0  . levothyroxine (SYNTHROID) 100 MCG tablet Take 1 tablet (100 mcg total) by mouth daily. 30 tablet 1  . lidocaine-prilocaine (EMLA) cream Apply to affected area once 30 g 3  . lisinopril (ZESTRIL) 20 MG tablet TAKE 1 TABLET BY MOUTH EVERY DAY 90 tablet 0  . LORazepam (ATIVAN) 0.5 MG tablet Take 1 tablet (0.5 mg total) by mouth every 6 (six) hours as needed (Nausea or vomiting). 30 tablet 0  . methocarbamol (ROBAXIN) 500 MG tablet Take  1 tablet (500 mg total) by mouth 4 (four) times daily. (Patient not taking: Reported on 10/02/2019) 30 tablet 0  . ondansetron (ZOFRAN) 8 MG tablet Take 1 tablet (8 mg total) by mouth 2 (two) times daily as needed (Nausea or vomiting). 30 tablet 1  . prochlorperazine (COMPAZINE) 10 MG tablet Take 1 tablet (10 mg total) by mouth every 6 (six) hours as needed (Nausea or vomiting). 30 tablet 1  . simvastatin (ZOCOR) 20 MG  tablet TAKE 1 TABLET (20 MG TOTAL) BY MOUTH DAILY. NEEDS OFFICE VISIT FOR FURTHER REFILLS 90 tablet 0  . traMADol (ULTRAM) 50 MG tablet Take 1 tablet (50 mg total) by mouth every 6 (six) hours as needed for moderate pain or severe pain. 10 tablet 0   No current facility-administered medications for this visit.    PHYSICAL EXAMINATION: ECOG PERFORMANCE STATUS: 1 - Symptomatic but completely ambulatory  Vitals:   10/24/19 1048  BP: 137/82  Pulse: 62  Resp: 18  Temp: 98.1 F (36.7 C)  SpO2: 100%   Filed Weights   10/24/19 1048  Weight: 251 lb 8 oz (114.1 kg)    LABORATORY DATA:  I have reviewed the data as listed CMP Latest Ref Rng & Units 10/16/2019 10/02/2019 08/13/2019  Glucose 70 - 99 mg/dL 115(H) 134(H) 107(H)  BUN 6 - 20 mg/dL '17 18 13  '$ Creatinine 0.44 - 1.00 mg/dL 0.80 0.74 0.68  Sodium 135 - 145 mmol/L 135 137 136  Potassium 3.5 - 5.1 mmol/L 4.4 3.9 4.7  Chloride 98 - 111 mmol/L 98 99 96  CO2 22 - 32 mmol/L '25 28 24  '$ Calcium 8.9 - 10.3 mg/dL 9.2 9.4 9.7  Total Protein 6.5 - 8.1 g/dL 7.3 7.8 7.0  Total Bilirubin 0.3 - 1.2 mg/dL 0.4 0.5 0.3  Alkaline Phos 38 - 126 U/L 100 106 128(H)  AST 15 - 41 U/L 13(L) 13(L) 15  ALT 0 - 44 U/L '10 12 11    '$ Lab Results  Component Value Date   WBC 8.2 10/24/2019   HGB 11.7 (L) 10/24/2019   HCT 37.1 10/24/2019   MCV 74.1 (L) 10/24/2019   PLT 390 10/24/2019   NEUTROABS 5.2 10/24/2019    ASSESSMENT & PLAN:  Malignant neoplasm of upper-outer quadrant of left breast in female, estrogen receptor negative ([place]) 09/30/2019:Patient palpated left breast mass. Mammogram showed a 2.3cm mass at the 2 o'clock position, normal-appearing axillary lymph nodes. Biopsy showed IDC, grade 3, HER-2 + (3+), ER/PR -, Ki67 40%. T2 N0 stage IIa clinical stage  Treatment plan: 1.Neoadjuvant chemotherapy with Taxol-Herceptin weekly X 24fllowed by Herceptin maintenance for 1 year versus Kadcyla maintenance depending on the final response 2.Breast  conserving surgery with sentinel lymph node biopsy 3.Adjuvant radiation ----------------------------------------------------------------------------------------------------------------------------------------------------- Breast MRI scheduled for 10/25/2019 Current treatment: Cycle 2 Taxol Herceptin Echocardiogram 10/14/2019: EF 60 to 65%  Chemo toxicities: 1.  Fatigue due to chemo 2.  Anemia due to chemotherapy: Monitoring closely  Anxiety: Much improved since she tolerated 1st cycle very well. Patient wants to see me with every treatment.  So we will add MD visit to each of her chemo appointments.    No orders of the defined types were placed in this encounter.  The patient has a good understanding of the overall plan. she agrees with it. she will call with any problems that may develop before the next visit here.  Total time spent: 30 mins including face to face time and time spent for planning, charting and coordination of care  Nicholas Lose, MD 10/24/2019  Julious Oka Dorshimer, am acting as scribe for Dr. Nicholas Lose.  I have reviewed the above documentation for accuracy and completeness, and I agree with the above.

## 2019-10-24 ENCOUNTER — Encounter: Payer: Self-pay | Admitting: *Deleted

## 2019-10-24 ENCOUNTER — Inpatient Hospital Stay: Payer: BC Managed Care – PPO

## 2019-10-24 ENCOUNTER — Other Ambulatory Visit: Payer: Self-pay

## 2019-10-24 ENCOUNTER — Inpatient Hospital Stay (HOSPITAL_BASED_OUTPATIENT_CLINIC_OR_DEPARTMENT_OTHER): Payer: BC Managed Care – PPO | Admitting: Hematology and Oncology

## 2019-10-24 DIAGNOSIS — Z95828 Presence of other vascular implants and grafts: Secondary | ICD-10-CM | POA: Insufficient documentation

## 2019-10-24 DIAGNOSIS — Z171 Estrogen receptor negative status [ER-]: Secondary | ICD-10-CM

## 2019-10-24 DIAGNOSIS — C50412 Malignant neoplasm of upper-outer quadrant of left female breast: Secondary | ICD-10-CM | POA: Diagnosis not present

## 2019-10-24 DIAGNOSIS — F419 Anxiety disorder, unspecified: Secondary | ICD-10-CM | POA: Diagnosis not present

## 2019-10-24 DIAGNOSIS — Z79899 Other long term (current) drug therapy: Secondary | ICD-10-CM | POA: Diagnosis not present

## 2019-10-24 DIAGNOSIS — Z5111 Encounter for antineoplastic chemotherapy: Secondary | ICD-10-CM | POA: Diagnosis not present

## 2019-10-24 LAB — CBC WITH DIFFERENTIAL (CANCER CENTER ONLY)
Abs Immature Granulocytes: 0.03 10*3/uL (ref 0.00–0.07)
Basophils Absolute: 0 10*3/uL (ref 0.0–0.1)
Basophils Relative: 1 %
Eosinophils Absolute: 0.3 10*3/uL (ref 0.0–0.5)
Eosinophils Relative: 3 %
HCT: 37.1 % (ref 36.0–46.0)
Hemoglobin: 11.7 g/dL — ABNORMAL LOW (ref 12.0–15.0)
Immature Granulocytes: 0 %
Lymphocytes Relative: 27 %
Lymphs Abs: 2.2 10*3/uL (ref 0.7–4.0)
MCH: 23.4 pg — ABNORMAL LOW (ref 26.0–34.0)
MCHC: 31.5 g/dL (ref 30.0–36.0)
MCV: 74.1 fL — ABNORMAL LOW (ref 80.0–100.0)
Monocytes Absolute: 0.5 10*3/uL (ref 0.1–1.0)
Monocytes Relative: 6 %
Neutro Abs: 5.2 10*3/uL (ref 1.7–7.7)
Neutrophils Relative %: 63 %
Platelet Count: 390 10*3/uL (ref 150–400)
RBC: 5.01 MIL/uL (ref 3.87–5.11)
RDW: 16.4 % — ABNORMAL HIGH (ref 11.5–15.5)
WBC Count: 8.2 10*3/uL (ref 4.0–10.5)
nRBC: 0 % (ref 0.0–0.2)

## 2019-10-24 LAB — CMP (CANCER CENTER ONLY)
ALT: 15 U/L (ref 0–44)
AST: 15 U/L (ref 15–41)
Albumin: 3.7 g/dL (ref 3.5–5.0)
Alkaline Phosphatase: 97 U/L (ref 38–126)
Anion gap: 10 (ref 5–15)
BUN: 23 mg/dL — ABNORMAL HIGH (ref 6–20)
CO2: 26 mmol/L (ref 22–32)
Calcium: 9.2 mg/dL (ref 8.9–10.3)
Chloride: 100 mmol/L (ref 98–111)
Creatinine: 0.8 mg/dL (ref 0.44–1.00)
GFR, Est AFR Am: >60 mL/min (ref >60)
GFR, Estimated: >60 mL/min (ref >60)
Glucose, Bld: 107 mg/dL — ABNORMAL HIGH (ref 70–99)
Potassium: 3.6 mmol/L (ref 3.5–5.1)
Sodium: 136 mmol/L (ref 135–145)
Total Bilirubin: 0.4 mg/dL (ref 0.3–1.2)
Total Protein: 7.5 g/dL (ref 6.5–8.1)

## 2019-10-24 MED ORDER — SODIUM CHLORIDE 0.9% FLUSH
10.0000 mL | Freq: Once | INTRAVENOUS | Status: AC
  Administered 2019-10-24: 10 mL
  Filled 2019-10-24: qty 10

## 2019-10-24 MED ORDER — SODIUM CHLORIDE 0.9% FLUSH
10.0000 mL | INTRAVENOUS | Status: DC | PRN
  Administered 2019-10-24: 10 mL
  Filled 2019-10-24: qty 10

## 2019-10-24 MED ORDER — HEPARIN SOD (PORK) LOCK FLUSH 100 UNIT/ML IV SOLN
500.0000 [IU] | Freq: Once | INTRAVENOUS | Status: AC | PRN
  Administered 2019-10-24: 500 [IU]
  Filled 2019-10-24: qty 5

## 2019-10-24 MED ORDER — DEXAMETHASONE SODIUM PHOSPHATE 10 MG/ML IJ SOLN
INTRAMUSCULAR | Status: AC
  Filled 2019-10-24: qty 1

## 2019-10-24 MED ORDER — SODIUM CHLORIDE 0.9 % IV SOLN
Freq: Once | INTRAVENOUS | Status: AC
  Filled 2019-10-24: qty 250

## 2019-10-24 MED ORDER — FAMOTIDINE IN NACL 20-0.9 MG/50ML-% IV SOLN
20.0000 mg | Freq: Once | INTRAVENOUS | Status: AC
  Administered 2019-10-24: 20 mg via INTRAVENOUS

## 2019-10-24 MED ORDER — DIPHENHYDRAMINE HCL 50 MG/ML IJ SOLN
INTRAMUSCULAR | Status: AC
  Filled 2019-10-24: qty 1

## 2019-10-24 MED ORDER — TRASTUZUMAB-DKST CHEMO 150 MG IV SOLR
2.0000 mg/kg | Freq: Once | INTRAVENOUS | Status: AC
  Administered 2019-10-24: 231 mg via INTRAVENOUS
  Filled 2019-10-24: qty 11

## 2019-10-24 MED ORDER — ONDANSETRON HCL 4 MG/2ML IJ SOLN
8.0000 mg | Freq: Once | INTRAMUSCULAR | Status: AC
  Administered 2019-10-24: 8 mg via INTRAVENOUS

## 2019-10-24 MED ORDER — DIPHENHYDRAMINE HCL 50 MG/ML IJ SOLN
25.0000 mg | Freq: Once | INTRAMUSCULAR | Status: AC
  Administered 2019-10-24: 25 mg via INTRAVENOUS

## 2019-10-24 MED ORDER — SODIUM CHLORIDE 0.9 % IV SOLN
80.0000 mg/m2 | Freq: Once | INTRAVENOUS | Status: AC
  Administered 2019-10-24: 180 mg via INTRAVENOUS
  Filled 2019-10-24: qty 30

## 2019-10-24 MED ORDER — ACETAMINOPHEN 325 MG PO TABS
650.0000 mg | ORAL_TABLET | Freq: Once | ORAL | Status: AC
  Administered 2019-10-24: 650 mg via ORAL

## 2019-10-24 MED ORDER — ONDANSETRON HCL 4 MG/2ML IJ SOLN
INTRAMUSCULAR | Status: AC
  Filled 2019-10-24: qty 4

## 2019-10-24 MED ORDER — ACETAMINOPHEN 325 MG PO TABS
ORAL_TABLET | ORAL | Status: AC
  Filled 2019-10-24: qty 2

## 2019-10-24 MED ORDER — DEXAMETHASONE SODIUM PHOSPHATE 10 MG/ML IJ SOLN
10.0000 mg | Freq: Once | INTRAMUSCULAR | Status: AC
  Administered 2019-10-24: 10 mg via INTRAVENOUS

## 2019-10-24 MED ORDER — FAMOTIDINE IN NACL 20-0.9 MG/50ML-% IV SOLN
INTRAVENOUS | Status: AC
  Filled 2019-10-24: qty 50

## 2019-10-24 NOTE — Patient Instructions (Signed)
[facility] Discharge Instructions for Patients Receiving Chemotherapy  Today you received the following chemotherapy agents: Trastuzumab, Taxol  To help prevent nausea and vomiting after your treatment, we encourage you to take your nausea medication as directed.   If you develop nausea and vomiting that is not controlled by your nausea medication, call the clinic.   BELOW ARE SYMPTOMS THAT SHOULD BE REPORTED IMMEDIATELY:  *FEVER GREATER THAN 100.5 F  *CHILLS WITH OR WITHOUT FEVER  NAUSEA AND VOMITING THAT IS NOT CONTROLLED WITH YOUR NAUSEA MEDICATION  *UNUSUAL SHORTNESS OF BREATH  *UNUSUAL BRUISING OR BLEEDING  TENDERNESS IN MOUTH AND THROAT WITH OR WITHOUT PRESENCE OF ULCERS  *URINARY PROBLEMS  *BOWEL PROBLEMS  UNUSUAL RASH Items with * indicate a potential emergency and should be followed up as soon as possible.  Feel free to call the clinic should you have any questions or concerns. The clinic phone number is [phone].  Please show the CHEMO ALERT CARD at check-in to the Emergency Department and triage nurse.   

## 2019-10-24 NOTE — Assessment & Plan Note (Signed)
09/30/2019:Patient palpated left breast mass. Mammogram showed a 2.3cm mass at the 2 o'clock position, normal-appearing axillary lymph nodes. Biopsy showed IDC, grade 3, HER-2 + (3+), ER/PR -, Ki67 40%. T2 N0 stage IIa clinical stage  Treatment plan: 1.Neoadjuvant chemotherapy with Taxol-Herceptin weekly X 48fllowed by Herceptin maintenance for 1 year versus Kadcyla maintenance depending on the final response 2.Breast conserving surgery with sentinel lymph node biopsy 3.Adjuvant radiation ----------------------------------------------------------------------------------------------------------------------------------------------------- Breast MRI scheduled for 10/25/2019 Current treatment: Cycle 2 Taxol Herceptin Echocardiogram 10/14/2019: EF 60 to 65%  Chemo toxicities:  Anxiety related to chemotherapy Return to clinic weekly for chemo and every other week for follow-up with me.

## 2019-10-25 ENCOUNTER — Telehealth: Payer: Self-pay | Admitting: Adult Health

## 2019-10-25 ENCOUNTER — Other Ambulatory Visit: Payer: BC Managed Care – PPO

## 2019-10-25 NOTE — Telephone Encounter (Signed)
Returned patient's phone call regarding rescheduling 01/20 appointment, per patient's request appointment has moved to 01/21.

## 2019-10-30 ENCOUNTER — Other Ambulatory Visit: Payer: Managed Care, Other (non HMO)

## 2019-10-30 ENCOUNTER — Other Ambulatory Visit: Payer: Self-pay

## 2019-10-30 ENCOUNTER — Ambulatory Visit: Payer: BC Managed Care – PPO | Admitting: Adult Health

## 2019-10-30 ENCOUNTER — Other Ambulatory Visit: Payer: BC Managed Care – PPO

## 2019-10-30 ENCOUNTER — Ambulatory Visit: Payer: Managed Care, Other (non HMO)

## 2019-10-31 ENCOUNTER — Other Ambulatory Visit: Payer: Self-pay

## 2019-10-31 ENCOUNTER — Inpatient Hospital Stay: Payer: BC Managed Care – PPO

## 2019-10-31 ENCOUNTER — Encounter: Payer: Self-pay | Admitting: Adult Health

## 2019-10-31 ENCOUNTER — Inpatient Hospital Stay (HOSPITAL_BASED_OUTPATIENT_CLINIC_OR_DEPARTMENT_OTHER): Payer: BC Managed Care – PPO | Admitting: Adult Health

## 2019-10-31 VITALS — BP 148/76 | HR 64 | Temp 98.2°F | Resp 18 | Ht 62.0 in | Wt 250.2 lb

## 2019-10-31 DIAGNOSIS — C50412 Malignant neoplasm of upper-outer quadrant of left female breast: Secondary | ICD-10-CM | POA: Diagnosis not present

## 2019-10-31 DIAGNOSIS — Z171 Estrogen receptor negative status [ER-]: Secondary | ICD-10-CM

## 2019-10-31 DIAGNOSIS — F419 Anxiety disorder, unspecified: Secondary | ICD-10-CM | POA: Diagnosis not present

## 2019-10-31 DIAGNOSIS — Z95828 Presence of other vascular implants and grafts: Secondary | ICD-10-CM

## 2019-10-31 DIAGNOSIS — Z5111 Encounter for antineoplastic chemotherapy: Secondary | ICD-10-CM | POA: Diagnosis not present

## 2019-10-31 DIAGNOSIS — Z79899 Other long term (current) drug therapy: Secondary | ICD-10-CM | POA: Diagnosis not present

## 2019-10-31 LAB — CBC WITH DIFFERENTIAL (CANCER CENTER ONLY)
Abs Immature Granulocytes: 0.03 10*3/uL (ref 0.00–0.07)
Basophils Absolute: 0.1 10*3/uL (ref 0.0–0.1)
Basophils Relative: 1 %
Eosinophils Absolute: 0.2 10*3/uL (ref 0.0–0.5)
Eosinophils Relative: 3 %
HCT: 36.7 % (ref 36.0–46.0)
Hemoglobin: 11.4 g/dL — ABNORMAL LOW (ref 12.0–15.0)
Immature Granulocytes: 0 %
Lymphocytes Relative: 31 %
Lymphs Abs: 2.2 10*3/uL (ref 0.7–4.0)
MCH: 23.6 pg — ABNORMAL LOW (ref 26.0–34.0)
MCHC: 31.1 g/dL (ref 30.0–36.0)
MCV: 76 fL — ABNORMAL LOW (ref 80.0–100.0)
Monocytes Absolute: 0.7 10*3/uL (ref 0.1–1.0)
Monocytes Relative: 9 %
Neutro Abs: 4 10*3/uL (ref 1.7–7.7)
Neutrophils Relative %: 56 %
Platelet Count: 473 10*3/uL — ABNORMAL HIGH (ref 150–400)
RBC: 4.83 MIL/uL (ref 3.87–5.11)
RDW: 17.1 % — ABNORMAL HIGH (ref 11.5–15.5)
WBC Count: 7.1 10*3/uL (ref 4.0–10.5)
nRBC: 0 % (ref 0.0–0.2)

## 2019-10-31 LAB — CMP (CANCER CENTER ONLY)
ALT: 18 U/L (ref 0–44)
AST: 22 U/L (ref 15–41)
Albumin: 3.5 g/dL (ref 3.5–5.0)
Alkaline Phosphatase: 99 U/L (ref 38–126)
Anion gap: 9 (ref 5–15)
BUN: 15 mg/dL (ref 6–20)
CO2: 26 mmol/L (ref 22–32)
Calcium: 9.4 mg/dL (ref 8.9–10.3)
Chloride: 99 mmol/L (ref 98–111)
Creatinine: 0.69 mg/dL (ref 0.44–1.00)
GFR, Est AFR Am: >60 mL/min (ref >60)
GFR, Estimated: >60 mL/min (ref >60)
Glucose, Bld: 105 mg/dL — ABNORMAL HIGH (ref 70–99)
Potassium: 3.9 mmol/L (ref 3.5–5.1)
Sodium: 134 mmol/L — ABNORMAL LOW (ref 135–145)
Total Bilirubin: 0.4 mg/dL (ref 0.3–1.2)
Total Protein: 7.4 g/dL (ref 6.5–8.1)

## 2019-10-31 MED ORDER — ONDANSETRON HCL 4 MG/2ML IJ SOLN
INTRAMUSCULAR | Status: AC
  Filled 2019-10-31: qty 4

## 2019-10-31 MED ORDER — HEPARIN SOD (PORK) LOCK FLUSH 100 UNIT/ML IV SOLN
500.0000 [IU] | Freq: Once | INTRAVENOUS | Status: AC | PRN
  Administered 2019-10-31: 500 [IU]
  Filled 2019-10-31: qty 5

## 2019-10-31 MED ORDER — DEXAMETHASONE SODIUM PHOSPHATE 10 MG/ML IJ SOLN
INTRAMUSCULAR | Status: AC
  Filled 2019-10-31: qty 1

## 2019-10-31 MED ORDER — ONDANSETRON HCL 4 MG/2ML IJ SOLN
8.0000 mg | Freq: Once | INTRAMUSCULAR | Status: AC
  Administered 2019-10-31: 8 mg via INTRAVENOUS

## 2019-10-31 MED ORDER — SODIUM CHLORIDE 0.9 % IV SOLN
80.0000 mg/m2 | Freq: Once | INTRAVENOUS | Status: AC
  Administered 2019-10-31: 180 mg via INTRAVENOUS
  Filled 2019-10-31: qty 30

## 2019-10-31 MED ORDER — FAMOTIDINE IN NACL 20-0.9 MG/50ML-% IV SOLN
20.0000 mg | Freq: Once | INTRAVENOUS | Status: AC
  Administered 2019-10-31: 20 mg via INTRAVENOUS

## 2019-10-31 MED ORDER — ACETAMINOPHEN 325 MG PO TABS
ORAL_TABLET | ORAL | Status: AC
  Filled 2019-10-31: qty 2

## 2019-10-31 MED ORDER — DEXAMETHASONE SODIUM PHOSPHATE 10 MG/ML IJ SOLN
10.0000 mg | Freq: Once | INTRAMUSCULAR | Status: AC
  Administered 2019-10-31: 10 mg via INTRAVENOUS

## 2019-10-31 MED ORDER — FAMOTIDINE IN NACL 20-0.9 MG/50ML-% IV SOLN
INTRAVENOUS | Status: AC
  Filled 2019-10-31: qty 50

## 2019-10-31 MED ORDER — DIPHENHYDRAMINE HCL 50 MG/ML IJ SOLN
25.0000 mg | Freq: Once | INTRAMUSCULAR | Status: AC
  Administered 2019-10-31: 25 mg via INTRAVENOUS

## 2019-10-31 MED ORDER — TRASTUZUMAB-DKST CHEMO 150 MG IV SOLR
2.0000 mg/kg | Freq: Once | INTRAVENOUS | Status: AC
  Administered 2019-10-31: 13:00:00 231 mg via INTRAVENOUS
  Filled 2019-10-31: qty 11

## 2019-10-31 MED ORDER — DIPHENHYDRAMINE HCL 50 MG/ML IJ SOLN
INTRAMUSCULAR | Status: AC
  Filled 2019-10-31: qty 1

## 2019-10-31 MED ORDER — SODIUM CHLORIDE 0.9% FLUSH
10.0000 mL | Freq: Once | INTRAVENOUS | Status: AC
  Administered 2019-10-31: 10 mL
  Filled 2019-10-31: qty 10

## 2019-10-31 MED ORDER — ACETAMINOPHEN 325 MG PO TABS
650.0000 mg | ORAL_TABLET | Freq: Once | ORAL | Status: AC
  Administered 2019-10-31: 650 mg via ORAL

## 2019-10-31 MED ORDER — SODIUM CHLORIDE 0.9% FLUSH
10.0000 mL | INTRAVENOUS | Status: DC | PRN
  Administered 2019-10-31: 10 mL
  Filled 2019-10-31: qty 10

## 2019-10-31 MED ORDER — SODIUM CHLORIDE 0.9 % IV SOLN
Freq: Once | INTRAVENOUS | Status: AC
  Filled 2019-10-31: qty 250

## 2019-10-31 NOTE — Progress Notes (Signed)
[facility] Follow up:    Veronica Pollen, MD [place] [id number]   DIAGNOSIS: Cancer Staging Malignant neoplasm of upper-outer quadrant of left breast in female, estrogen receptor negative ([place]) Staging form: Breast, AJCC 8th Edition - Clinical stage from 10/02/2019: Stage IIA (cT2, cN0, cM0, G3, ER-, PR-, HER2+) - Signed by Nicholas Lose, MD on 10/02/2019   SUMMARY OF ONCOLOGIC HISTORY: Oncology History  Malignant neoplasm of upper-outer quadrant of left breast in female, estrogen receptor negative ([place])  09/30/2019 Initial Diagnosis   Patient palpated left breast mass. Mammogram showed a 2.3cm mass at the 2 o'clock position, normal-appearing axillary lymph nodes. Biopsy showed IDC, grade 3, HER-2 + (3+), ER/PR -, Ki67 40%.   10/02/2019 Cancer Staging   Staging form: Breast, AJCC 8th Edition - Clinical stage from 10/02/2019: Stage IIA (cT2, cN0, cM0, G3, ER-, PR-, HER2+) - Signed by Nicholas Lose, MD on 10/02/2019   10/16/2019 -  Chemotherapy   The patient had ondansetron (ZOFRAN) injection 8 mg, 8 mg (100 % of original dose 8 mg), Intravenous,  Once, 1 of 3 cycles Dose modification: 8 mg (original dose 8 mg, Cycle 1) Administration: 8 mg (10/16/2019), 8 mg (10/24/2019) PACLitaxel (TAXOL) 180 mg in sodium chloride 0.9 % 250 mL chemo infusion (</= 52m/m2), 80 mg/m2 = 180 mg, Intravenous,  Once, 1 of 3 cycles Administration: 180 mg (10/16/2019), 180 mg (10/24/2019) trastuzumab-dkst (OGIVRI) 450 mg in sodium chloride 0.9 % 250 mL chemo infusion, 462 mg, Intravenous,  Once, 1 of 3 cycles Administration: 450 mg (10/16/2019), 231 mg (10/24/2019)  for chemotherapy treatment.      CURRENT THERAPY: Taxol/Herceptin week 3  INTERVAL HISTORY: Veronica Velazquez 54y.o. female returns for evaluation prior to receiving her weekly Taxol/Herceptin neoadjuvantly.  She is accompanied by her daughter Veronica Velazquez  SDaniseis doing well today.  She notes that she is still  adjusting to the port and has some mild pain in her neck intermittently.  She takes tylenol for this when she needs it. She is otherwise doing well and is staying active by walking.     Patient Active Problem List   Diagnosis Date Noted  . Port-A-Cath in place 10/24/2019  . Malignant neoplasm of upper-outer quadrant of left breast in female, estrogen receptor negative ([place] 09/30/2019  . Breast mass, left 08/13/2019  . Trapezius muscle spasm 08/09/2019  . Asthma 09/12/2012  . HTN (hypertension) 09/12/2012  . Hypothyroidism   . Obesity     has No Known Allergies.  MEDICAL HISTORY: Past Medical History:  Diagnosis Date  . Asthma   . Breast mass, left 08/13/2019  . Depression   . Hyperglycemia   . Hypertension   . Hypothyroidism   . Microcytosis   . Obesity   . Prediabetes   . Trapezius muscle spasm 08/09/2019    SURGICAL HISTORY: Past Surgical History:  Procedure Laterality Date  . ABDOMINAL HYSTERECTOMY    . PORTACATH PLACEMENT Right 10/15/2019   Procedure: INSERTION PORT-A-CATH WITH ULTRASOUND;  Surgeon: NAlphonsa Overall MD;  [facility]  Service: General;  Laterality: Right;    SOCIAL HISTORY: Social History   Socioeconomic History  . Marital status: Married    Spouse name: Veronica Velazquez  . Number of children: 3  . Years of education: 7  . Highest education level: Not on file  Occupational History  . Occupation: homemaker  Tobacco Use  . Smoking status: Never Smoker  . Smokeless tobacco: Never Used  Substance and Sexual Activity  . Alcohol use: No  . Drug use: No  . Sexual activity: Yes    Partners: Male  Other Topics Concern  . Not on file  Social History Narrative   Lives with her husband and two daughters.  They care for their 60 year-old granddaughter during the day while her mother (their daughter) works.   Social Determinants of Health   Financial Resource Strain:   . Difficulty of Paying Living Expenses: Not on file  Food  Insecurity:   . Worried About Charity fundraiser in the Last Year: Not on file  . Ran Out of Food in the Last Year: Not on file  Transportation Needs:   . Lack of Transportation (Medical): Not on file  . Lack of Transportation (Non-Medical): Not on file  Physical Activity:   . Days of Exercise per Week: Not on file  . Minutes of Exercise per Session: Not on file  Stress:   . Feeling of Stress : Not on file  Social Connections:   . Frequency of Communication with Friends and Family: Not on file  . Frequency of Social Gatherings with Friends and Family: Not on file  . Attends Religious Services: Not on file  . Active Member of Clubs or Organizations: Not on file  . Attends Archivist Meetings: Not on file  . Marital Status: Not on file  Intimate Partner Violence:   . Fear of Current or Ex-Partner: Not on file  . Emotionally Abused: Not on file  . Physically Abused: Not on file  . Sexually Abused: Not on file    FAMILY HISTORY: Family History  Problem Relation Age of Onset  . Diabetes Brother        drug-induced    Review of Systems  Constitutional: Positive for fatigue. Negative for appetite change, chills, fever and unexpected weight change.  HENT:   Negative for hearing loss, lump/mass and trouble swallowing.   Eyes: Negative for eye problems and icterus.  Respiratory: Negative for chest tightness, cough and shortness of breath.   Cardiovascular: Negative for chest pain, leg swelling and palpitations.  Gastrointestinal: Negative for abdominal distention, abdominal pain, constipation, diarrhea, nausea and vomiting.  Endocrine: Negative for hot flashes.  Musculoskeletal: Negative for arthralgias.  Skin: Negative for itching and rash.  Neurological: Negative for dizziness, extremity weakness and numbness.  Hematological: Negative for adenopathy. Does not bruise/bleed easily.  Psychiatric/Behavioral: Negative for depression. The patient is not nervous/anxious.        PHYSICAL EXAMINATION  ECOG PERFORMANCE STATUS: 1 - Symptomatic but completely ambulatory  Vitals:   10/31/19 1140  BP: (!) 148/76  Pulse: 64  Resp: 18  Temp: 98.2 F (36.8 C)  SpO2: 99%    Physical Exam Constitutional:      Appearance: Normal appearance. She is not toxic-appearing.  HENT:     Head: Normocephalic and atraumatic.  Eyes:     General: No scleral icterus. Neck:     Comments: No swelling at port insertion site noted  Cardiovascular:     Rate and Rhythm: Normal rate and regular rhythm.     Pulses: Normal pulses.     Heart sounds: Normal heart sounds.  Pulmonary:     Effort: Pulmonary effort is normal.     Breath sounds: Normal breath sounds.  Abdominal:     General: Abdomen is flat. Bowel sounds are normal. There is no distension.     Palpations: Abdomen is soft.  Tenderness: There is no abdominal tenderness.  Musculoskeletal:        General: No swelling.     Cervical back: Neck supple.  Skin:    General: Skin is warm and dry.     Capillary Refill: Capillary refill takes less than 2 seconds.     Findings: No rash.  Neurological:     General: No focal deficit present.     Mental Status: She is alert and oriented to person, place, and time.  Psychiatric:        Mood and Affect: Mood normal.        Behavior: Behavior normal.     LABORATORY DATA:  CBC    Component Value Date/Time   WBC 7.1 10/31/2019 1052   WBC 8.8 05/15/2015 1418   RBC 4.83 10/31/2019 1052   HGB 11.4 (L) 10/31/2019 1052   HGB 14.0 08/05/2017 1612   HCT 36.7 10/31/2019 1052   HCT 42.3 08/05/2017 1612   PLT 473 (H) 10/31/2019 1052   PLT 385 (H) 08/05/2017 1612   MCV 76.0 (L) 10/31/2019 1052   MCV 75.8 (A) 02/01/2018 1242   MCV 77 (L) 08/05/2017 1612   MCH 23.6 (L) 10/31/2019 1052   MCHC 31.1 10/31/2019 1052   RDW 17.1 (H) 10/31/2019 1052   RDW 15.2 08/05/2017 1612   LYMPHSABS 2.2 10/31/2019 1052   LYMPHSABS 3.2 (H) 08/05/2017 1612   MONOABS 0.7 10/31/2019 1052    EOSABS 0.2 10/31/2019 1052   EOSABS 0.5 (H) 08/05/2017 1612   BASOSABS 0.1 10/31/2019 1052   BASOSABS 0.0 08/05/2017 1612    CMP     Component Value Date/Time   NA 136 10/24/2019 1006   NA 136 08/13/2019 1515   K 3.6 10/24/2019 1006   CL 100 10/24/2019 1006   CO2 26 10/24/2019 1006   GLUCOSE 107 (H) 10/24/2019 1006   BUN 23 (H) 10/24/2019 1006   BUN 13 08/13/2019 1515   CREATININE 0.80 10/24/2019 1006   CREATININE 0.61 07/27/2016 1729   CALCIUM 9.2 10/24/2019 1006   PROT 7.5 10/24/2019 1006   PROT 7.0 08/13/2019 1515   ALBUMIN 3.7 10/24/2019 1006   ALBUMIN 4.2 08/13/2019 1515   AST 15 10/24/2019 1006   ALT 15 10/24/2019 1006   ALKPHOS 97 10/24/2019 1006   BILITOT 0.4 10/24/2019 1006   GFRNONAA >60 10/24/2019 1006   GFRNONAA >89 07/27/2016 1729   GFRAA >60 10/24/2019 1006   GFRAA >89 07/27/2016 1729     ASSESSMENT and PLAN:   Malignant neoplasm of upper-outer quadrant of left breast in female, estrogen receptor negative ([place]) 09/30/2019:Patient palpated left breast mass. Mammogram showed a 2.3cm mass at the 2 o'clock position, normal-appearing axillary lymph nodes. Biopsy showed IDC, grade 3, HER-2 + (3+), ER/PR -, Ki67 40%. T2 N0 stage IIa clinical stage  Treatment plan: 1.Neoadjuvant chemotherapy with Taxol-Herceptin weekly X 1fllowed by Herceptin maintenance for 1 year versus Kadcyla maintenance depending on the final response 2.Breast conserving surgery with sentinel lymph node biopsy 3.Adjuvant radiation ----------------------------------------------------------------------------------------------------------------------------------------------------- Breast MRI neoadjuvantly not done per patient preference Current treatment: Cycle 3 Taxol Herceptin Echocardiogram 10/14/2019: EF 60 to 65%  Chemo toxicities: SJankiis doing well today. I  Reviewed that it is normal at this point to have intermittent discomfort at her port insertion site in her neck.  She  will continue tylenol, and her body should continue to adjust to it.  I reviewed with Veronica Velazquez and Veronica Puntthat if the pain becomes worse, constant, and the neck is  persistently swollen, to please let me know.    Veronica Velazquez is tolerating chemotherapy well.  She is walking a few times a day and I recommended that she continue this.  She has no peripheral neuropathy.    We will see her back in 1 week for labs, f/u, and week four of her treatment.     All questions were answered. The patient knows to call the clinic with any problems, questions or concerns. We can certainly see the patient much sooner if necessary.  Total time this encounter: 20 minutes  This note was electronically signed. Scot Dock, NP 10/31/2019

## 2019-10-31 NOTE — Patient Instructions (Signed)
[facility] Discharge Instructions for Patients Receiving Chemotherapy  Today you received the following chemotherapy agents: Trastuzumab, Taxol  To help prevent nausea and vomiting after your treatment, we encourage you to take your nausea medication as directed.   If you develop nausea and vomiting that is not controlled by your nausea medication, call the clinic.   BELOW ARE SYMPTOMS THAT SHOULD BE REPORTED IMMEDIATELY:  *FEVER GREATER THAN 100.5 F  *CHILLS WITH OR WITHOUT FEVER  NAUSEA AND VOMITING THAT IS NOT CONTROLLED WITH YOUR NAUSEA MEDICATION  *UNUSUAL SHORTNESS OF BREATH  *UNUSUAL BRUISING OR BLEEDING  TENDERNESS IN MOUTH AND THROAT WITH OR WITHOUT PRESENCE OF ULCERS  *URINARY PROBLEMS  *BOWEL PROBLEMS  UNUSUAL RASH Items with * indicate a potential emergency and should be followed up as soon as possible.  Feel free to call the clinic should you have any questions or concerns. The clinic phone number is [phone].  Please show the CHEMO ALERT CARD at check-in to the Emergency Department and triage nurse.   

## 2019-10-31 NOTE — Assessment & Plan Note (Addendum)
09/30/2019:Patient palpated left breast mass. Mammogram showed a 2.3cm mass at the 2 o'clock position, normal-appearing axillary lymph nodes. Biopsy showed IDC, grade 3, HER-2 + (3+), ER/PR -, Ki67 40%. T2 N0 stage IIa clinical stage  Treatment plan: 1.Neoadjuvant chemotherapy with Taxol-Herceptin weekly X 39fllowed by Herceptin maintenance for 1 year versus Kadcyla maintenance depending on the final response 2.Breast conserving surgery with sentinel lymph node biopsy 3.Adjuvant radiation ----------------------------------------------------------------------------------------------------------------------------------------------------- Breast MRI neoadjuvantly not done per patient preference Current treatment: Cycle 3 Taxol Herceptin Echocardiogram 10/14/2019: EF 60 to 65%  Chemo toxicities: SYentlis doing well today. I  Reviewed that it is normal at this point to have intermittent discomfort at her port insertion site in her neck.  She will continue tylenol, and her body should continue to adjust to it.  I reviewed with Tuyet and MLilyan Puntthat if the pain becomes worse, constant, and the neck is persistently swollen, to please let me know.    SHubertais tolerating chemotherapy well.  She is walking a few times a day and I recommended that she continue this.  She has no peripheral neuropathy.    We will see her back in 1 week for labs, f/u, and week four of her treatment.

## 2019-11-05 NOTE — Progress Notes (Signed)
Patient Care Team: Horald Pollen, MD as PCP - General (Internal Medicine) Mauro Kaufmann, RN as Oncology Nurse Navigator Rockwell Germany, RN as Oncology Nurse Navigator Nicholas Lose, MD as Consulting Physician (Hematology and Oncology) Eppie Gibson, MD as Attending Physician (Radiation Oncology) Alphonsa Overall, MD as Consulting Physician (General Surgery)  DIAGNOSIS:    ICD-10-CM   1. Malignant neoplasm of upper-outer quadrant of left breast in female, estrogen receptor negative ([place])  C50.412    Z17.1     SUMMARY OF ONCOLOGIC HISTORY: Oncology History  Malignant neoplasm of upper-outer quadrant of left breast in female, estrogen receptor negative ([place])  09/30/2019 Initial Diagnosis   Patient palpated left breast mass. Mammogram showed a 2.3cm mass at the 2 o'clock position, normal-appearing axillary lymph nodes. Biopsy showed IDC, grade 3, HER-2 + (3+), ER/PR -, Ki67 40%.   10/02/2019 Cancer Staging   Staging form: Breast, AJCC 8th Edition - Clinical stage from 10/02/2019: Stage IIA (cT2, cN0, cM0, G3, ER-, PR-, HER2+) - Signed by Nicholas Lose, MD on 10/02/2019   10/16/2019 -  Chemotherapy   The patient had ondansetron (ZOFRAN) injection 8 mg, 8 mg (100 % of original dose 8 mg), Intravenous,  Once, 1 of 3 cycles Dose modification: 8 mg (original dose 8 mg, Cycle 1) Administration: 8 mg (10/16/2019), 8 mg (10/24/2019), 8 mg (10/31/2019) PACLitaxel (TAXOL) 180 mg in sodium chloride 0.9 % 250 mL chemo infusion (</= 69m/m2), 80 mg/m2 = 180 mg, Intravenous,  Once, 1 of 3 cycles Administration: 180 mg (10/16/2019), 180 mg (10/24/2019), 180 mg (10/31/2019) trastuzumab-dkst (OGIVRI) 450 mg in sodium chloride 0.9 % 250 mL chemo infusion, 462 mg, Intravenous,  Once, 1 of 3 cycles Administration: 450 mg (10/16/2019), 231 mg (10/24/2019), 231 mg (10/31/2019)  for chemotherapy treatment.      CHIEF COMPLIANT: Cycle 4 Taxol Herceptin  INTERVAL HISTORY: Veronica KERCHEVALis a 54y.o. with  above-mentioned history of HER-2 positive left breast cancer who is currently on neoadjuvant chemotherapy treatment with weekly Taxol Herceptin.She presents to the clinic todayfor cycle 4 and a toxicity check.   Apart from hair loss she is tolerating the treatment extremely well. She has noticed a couple of episodes of blurring of vision.  This did not last very long.  ALLERGIES:  has No Known Allergies.  MEDICATIONS:  Current Outpatient Medications  Medication Sig Dispense Refill  . albuterol (PROVENTIL HFA;VENTOLIN HFA) 108 (90 Base) MCG/ACT inhaler Inhale 2 puffs into the lungs every 3 (three) hours as needed for wheezing (cough, shortness of breath or wheezing.). 1 Inhaler 3  . atenolol-chlorthalidone (TENORETIC) 100-25 MG tablet TAKE 1/2 TABLET BY MOUTH DAILY 45 tablet 0  . guaiFENesin-codeine (ROBITUSSIN AC) 100-10 MG/5ML syrup Take 5-10 mLs by mouth 4 (four) times daily as needed for cough. 180 mL 0  . ibuprofen (ADVIL) 800 MG tablet Take 1 tablet (800 mg total) by mouth every 8 (eight) hours as needed. 30 tablet 0  . levothyroxine (SYNTHROID) 100 MCG tablet Take 1 tablet (100 mcg total) by mouth daily. 30 tablet 1  . lidocaine-prilocaine (EMLA) cream Apply to affected area once 30 g 3  . lisinopril (ZESTRIL) 20 MG tablet TAKE 1 TABLET BY MOUTH EVERY DAY 90 tablet 0  . LORazepam (ATIVAN) 0.5 MG tablet Take 1 tablet (0.5 mg total) by mouth every 6 (six) hours as needed (Nausea or vomiting). 30 tablet 0  . methocarbamol (ROBAXIN) 500 MG tablet Take 1 tablet (500 mg total) by mouth 4 (four) times  daily. 30 tablet 0  . ondansetron (ZOFRAN) 8 MG tablet Take 1 tablet (8 mg total) by mouth 2 (two) times daily as needed (Nausea or vomiting). 30 tablet 1  . prochlorperazine (COMPAZINE) 10 MG tablet Take 1 tablet (10 mg total) by mouth every 6 (six) hours as needed (Nausea or vomiting). 30 tablet 1  . simvastatin (ZOCOR) 20 MG tablet TAKE 1 TABLET (20 MG TOTAL) BY MOUTH DAILY. NEEDS OFFICE VISIT  FOR FURTHER REFILLS 90 tablet 0  . traMADol (ULTRAM) 50 MG tablet Take 1 tablet (50 mg total) by mouth every 6 (six) hours as needed for moderate pain or severe pain. 10 tablet 0   No current facility-administered medications for this visit.    PHYSICAL EXAMINATION: ECOG PERFORMANCE STATUS: 1 - Symptomatic but completely ambulatory  Vitals:   11/06/19 0916  BP: (!) 128/91  Pulse: 70  Resp: 16  Temp: 98.1 F (36.7 C)  SpO2: 100%   Filed Weights   11/06/19 0916  Weight: 251 lb 9.6 oz (114.1 kg)    LABORATORY DATA:  I have reviewed the data as listed CMP Latest Ref Rng & Units 10/31/2019 10/24/2019 10/16/2019  Glucose 70 - 99 mg/dL 105(H) 107(H) 115(H)  BUN 6 - 20 mg/dL 15 23(H) 17  Creatinine 0.44 - 1.00 mg/dL 0.69 0.80 0.80  Sodium 135 - 145 mmol/L 134(L) 136 135  Potassium 3.5 - 5.1 mmol/L 3.9 3.6 4.4  Chloride 98 - 111 mmol/L 99 100 98  CO2 22 - 32 mmol/L _0 Calcium 8.9 - 10.3 mg/dL 9.4 9.2 9.2  Total Protein 6.5 - 8.1 g/dL 7.4 7.5 7.3  Total Bilirubin 0.3 - 1.2 mg/dL 0.4 0.4 0.4  Alkaline Phos 38 - 126 U/L 99 97 100  AST 15 - 41 U/L 22 15 13(L)  ALT 0 - 44 U/L _1 Lab Results  Component Value Date   WBC 5.5 11/06/2019   HGB 11.3 (L) 11/06/2019   HCT 35.9 (L) 11/06/2019   MCV 74.5 (L) 11/06/2019   PLT 544 (H) 11/06/2019   NEUTROABS 3.1 11/06/2019    ASSESSMENT & PLAN:  Malignant neoplasm of upper-outer quadrant of left breast in female, estrogen receptor negative ([place]) 09/30/2019:Patient palpated left breast mass. Mammogram showed a 2.3cm mass at the 2 o'clock position, normal-appearing axillary lymph nodes. Biopsy showed IDC, grade 3, HER-2 + (3+), ER/PR -, Ki67 40%. T2 N0 stage IIa clinical stage  Treatment plan: 1.Neoadjuvant chemotherapy with Taxol-Herceptin weekly X 38fllowed by Herceptin maintenance for 1 year versus Kadcyla maintenance depending on the final response 2.Breast conserving surgery with sentinel lymph node biopsy  3.Adjuvant radiation ----------------------------------------------------------------------------------------------------------------------------------------------------- Breast MRI neoadjuvantly not done per patient preference Current treatment: Cycle 4 Taxol Herceptin Echocardiogram 10/14/2019: EF 60 to 65%  Chemo toxicities: 1.  Mild fatigue Denies peripheral neuropathy. 2.  2 episodes of blurring of vision: Monitoring closely.  It could be blood pressure related.  She has been instructed to check her blood pressure frequently.  Intermittent neck pain: Due to port Return to clinic weekly for labs and follow-up      No orders of the defined types were placed in this encounter.  The patient has a good understanding of the overall plan. she agrees with it. she will call with any problems that may develop before the next visit here.  Total time spent: 30 mins including face to face time and time spent for planning, charting and coordination of care  GNicholas Lose MD  11/06/2019  I, Molly Dorshimer, am acting as scribe for Dr. Nicholas Lose.  I have reviewed the above documentation for accuracy and completeness, and I agree with the above.

## 2019-11-06 ENCOUNTER — Inpatient Hospital Stay: Payer: BC Managed Care – PPO

## 2019-11-06 ENCOUNTER — Inpatient Hospital Stay (HOSPITAL_BASED_OUTPATIENT_CLINIC_OR_DEPARTMENT_OTHER): Payer: BC Managed Care – PPO | Admitting: Hematology and Oncology

## 2019-11-06 ENCOUNTER — Encounter: Payer: Self-pay | Admitting: *Deleted

## 2019-11-06 ENCOUNTER — Other Ambulatory Visit: Payer: Self-pay

## 2019-11-06 DIAGNOSIS — Z95828 Presence of other vascular implants and grafts: Secondary | ICD-10-CM

## 2019-11-06 DIAGNOSIS — Z171 Estrogen receptor negative status [ER-]: Secondary | ICD-10-CM | POA: Diagnosis not present

## 2019-11-06 DIAGNOSIS — C50412 Malignant neoplasm of upper-outer quadrant of left female breast: Secondary | ICD-10-CM | POA: Diagnosis not present

## 2019-11-06 DIAGNOSIS — Z5111 Encounter for antineoplastic chemotherapy: Secondary | ICD-10-CM | POA: Diagnosis not present

## 2019-11-06 DIAGNOSIS — F419 Anxiety disorder, unspecified: Secondary | ICD-10-CM | POA: Diagnosis not present

## 2019-11-06 DIAGNOSIS — Z79899 Other long term (current) drug therapy: Secondary | ICD-10-CM | POA: Diagnosis not present

## 2019-11-06 LAB — CMP (CANCER CENTER ONLY)
ALT: 16 U/L (ref 0–44)
AST: 15 U/L (ref 15–41)
Albumin: 3.6 g/dL (ref 3.5–5.0)
Alkaline Phosphatase: 89 U/L (ref 38–126)
Anion gap: 10 (ref 5–15)
BUN: 16 mg/dL (ref 6–20)
CO2: 28 mmol/L (ref 22–32)
Calcium: 9.5 mg/dL (ref 8.9–10.3)
Chloride: 97 mmol/L — ABNORMAL LOW (ref 98–111)
Creatinine: 0.81 mg/dL (ref 0.44–1.00)
GFR, Est AFR Am: >60 mL/min (ref >60)
GFR, Estimated: >60 mL/min (ref >60)
Glucose, Bld: 174 mg/dL — ABNORMAL HIGH (ref 70–99)
Potassium: 3.5 mmol/L (ref 3.5–5.1)
Sodium: 135 mmol/L (ref 135–145)
Total Bilirubin: 0.5 mg/dL (ref 0.3–1.2)
Total Protein: 7.3 g/dL (ref 6.5–8.1)

## 2019-11-06 LAB — CBC WITH DIFFERENTIAL (CANCER CENTER ONLY)
Abs Immature Granulocytes: 0.03 10*3/uL (ref 0.00–0.07)
Basophils Absolute: 0.1 10*3/uL (ref 0.0–0.1)
Basophils Relative: 1 %
Eosinophils Absolute: 0.1 10*3/uL (ref 0.0–0.5)
Eosinophils Relative: 2 %
HCT: 35.9 % — ABNORMAL LOW (ref 36.0–46.0)
Hemoglobin: 11.3 g/dL — ABNORMAL LOW (ref 12.0–15.0)
Immature Granulocytes: 1 %
Lymphocytes Relative: 32 %
Lymphs Abs: 1.8 10*3/uL (ref 0.7–4.0)
MCH: 23.4 pg — ABNORMAL LOW (ref 26.0–34.0)
MCHC: 31.5 g/dL (ref 30.0–36.0)
MCV: 74.5 fL — ABNORMAL LOW (ref 80.0–100.0)
Monocytes Absolute: 0.4 10*3/uL (ref 0.1–1.0)
Monocytes Relative: 7 %
Neutro Abs: 3.1 10*3/uL (ref 1.7–7.7)
Neutrophils Relative %: 57 %
Platelet Count: 544 10*3/uL — ABNORMAL HIGH (ref 150–400)
RBC: 4.82 MIL/uL (ref 3.87–5.11)
RDW: 17.2 % — ABNORMAL HIGH (ref 11.5–15.5)
WBC Count: 5.5 10*3/uL (ref 4.0–10.5)
nRBC: 0 % (ref 0.0–0.2)

## 2019-11-06 MED ORDER — DEXAMETHASONE SODIUM PHOSPHATE 10 MG/ML IJ SOLN
INTRAMUSCULAR | Status: AC
  Filled 2019-11-06: qty 1

## 2019-11-06 MED ORDER — SODIUM CHLORIDE 0.9 % IV SOLN
80.0000 mg/m2 | Freq: Once | INTRAVENOUS | Status: AC
  Administered 2019-11-06: 180 mg via INTRAVENOUS
  Filled 2019-11-06: qty 30

## 2019-11-06 MED ORDER — ACETAMINOPHEN 325 MG PO TABS
650.0000 mg | ORAL_TABLET | Freq: Once | ORAL | Status: AC
  Administered 2019-11-06: 650 mg via ORAL

## 2019-11-06 MED ORDER — DIPHENHYDRAMINE HCL 50 MG/ML IJ SOLN
25.0000 mg | Freq: Once | INTRAMUSCULAR | Status: AC
  Administered 2019-11-06: 25 mg via INTRAVENOUS

## 2019-11-06 MED ORDER — ONDANSETRON HCL 4 MG/2ML IJ SOLN
INTRAMUSCULAR | Status: AC
  Filled 2019-11-06: qty 4

## 2019-11-06 MED ORDER — DEXAMETHASONE SODIUM PHOSPHATE 10 MG/ML IJ SOLN
10.0000 mg | Freq: Once | INTRAMUSCULAR | Status: AC
  Administered 2019-11-06: 10 mg via INTRAVENOUS

## 2019-11-06 MED ORDER — SODIUM CHLORIDE 0.9% FLUSH
10.0000 mL | INTRAVENOUS | Status: DC | PRN
  Administered 2019-11-06: 10 mL
  Filled 2019-11-06: qty 10

## 2019-11-06 MED ORDER — SODIUM CHLORIDE 0.9 % IV SOLN
Freq: Once | INTRAVENOUS | Status: AC
  Filled 2019-11-06: qty 250

## 2019-11-06 MED ORDER — TRASTUZUMAB-DKST CHEMO 150 MG IV SOLR
2.0000 mg/kg | Freq: Once | INTRAVENOUS | Status: AC
  Administered 2019-11-06: 231 mg via INTRAVENOUS
  Filled 2019-11-06: qty 11

## 2019-11-06 MED ORDER — FAMOTIDINE IN NACL 20-0.9 MG/50ML-% IV SOLN
INTRAVENOUS | Status: AC
  Filled 2019-11-06: qty 50

## 2019-11-06 MED ORDER — ACETAMINOPHEN 325 MG PO TABS
ORAL_TABLET | ORAL | Status: AC
  Filled 2019-11-06: qty 2

## 2019-11-06 MED ORDER — ONDANSETRON HCL 4 MG/2ML IJ SOLN
8.0000 mg | Freq: Once | INTRAMUSCULAR | Status: AC
  Administered 2019-11-06: 8 mg via INTRAVENOUS

## 2019-11-06 MED ORDER — SODIUM CHLORIDE 0.9% FLUSH
10.0000 mL | Freq: Once | INTRAVENOUS | Status: AC
  Administered 2019-11-06: 10 mL
  Filled 2019-11-06: qty 10

## 2019-11-06 MED ORDER — DIPHENHYDRAMINE HCL 50 MG/ML IJ SOLN
INTRAMUSCULAR | Status: AC
  Filled 2019-11-06: qty 1

## 2019-11-06 MED ORDER — FAMOTIDINE IN NACL 20-0.9 MG/50ML-% IV SOLN
20.0000 mg | Freq: Once | INTRAVENOUS | Status: AC
  Administered 2019-11-06: 20 mg via INTRAVENOUS

## 2019-11-06 MED ORDER — HEPARIN SOD (PORK) LOCK FLUSH 100 UNIT/ML IV SOLN
500.0000 [IU] | Freq: Once | INTRAVENOUS | Status: AC | PRN
  Administered 2019-11-06: 500 [IU]
  Filled 2019-11-06: qty 5

## 2019-11-06 NOTE — Patient Instructions (Signed)
[facility] Discharge Instructions for Patients Receiving Chemotherapy  Today you received the following chemotherapy agents: Trastuzumab, Taxol  To help prevent nausea and vomiting after your treatment, we encourage you to take your nausea medication as directed.   If you develop nausea and vomiting that is not controlled by your nausea medication, call the clinic.   BELOW ARE SYMPTOMS THAT SHOULD BE REPORTED IMMEDIATELY:  *FEVER GREATER THAN 100.5 F  *CHILLS WITH OR WITHOUT FEVER  NAUSEA AND VOMITING THAT IS NOT CONTROLLED WITH YOUR NAUSEA MEDICATION  *UNUSUAL SHORTNESS OF BREATH  *UNUSUAL BRUISING OR BLEEDING  TENDERNESS IN MOUTH AND THROAT WITH OR WITHOUT PRESENCE OF ULCERS  *URINARY PROBLEMS  *BOWEL PROBLEMS  UNUSUAL RASH Items with * indicate a potential emergency and should be followed up as soon as possible.  Feel free to call the clinic should you have any questions or concerns. The clinic phone number is [phone].  Please show the CHEMO ALERT CARD at check-in to the Emergency Department and triage nurse.   

## 2019-11-06 NOTE — Assessment & Plan Note (Signed)
09/30/2019:Patient palpated left breast mass. Mammogram showed a 2.3cm mass at the 2 o'clock position, normal-appearing axillary lymph nodes. Biopsy showed IDC, grade 3, HER-2 + (3+), ER/PR -, Ki67 40%. T2 N0 stage IIa clinical stage  Treatment plan: 1.Neoadjuvant chemotherapy with Taxol-Herceptin weekly X 77fllowed by Herceptin maintenance for 1 year versus Kadcyla maintenance depending on the final response 2.Breast conserving surgery with sentinel lymph node biopsy 3.Adjuvant radiation ----------------------------------------------------------------------------------------------------------------------------------------------------- Breast MRI neoadjuvantly not done per patient preference Current treatment: Cycle 4 Taxol Herceptin Echocardiogram 10/14/2019: EF 60 to 65%  Chemo toxicities: 1.  Mild fatigue Denies peripheral neuropathy.  Intermittent neck pain: Due to port Return to clinic weekly for labs and follow-up

## 2019-11-12 ENCOUNTER — Telehealth: Payer: Self-pay | Admitting: Emergency Medicine

## 2019-11-12 ENCOUNTER — Telehealth: Payer: Self-pay | Admitting: Hematology and Oncology

## 2019-11-12 ENCOUNTER — Telehealth: Payer: BC Managed Care – PPO | Admitting: Genetic Counselor

## 2019-11-12 ENCOUNTER — Other Ambulatory Visit: Payer: Self-pay | Admitting: Emergency Medicine

## 2019-11-12 DIAGNOSIS — I10 Essential (primary) hypertension: Secondary | ICD-10-CM

## 2019-11-12 MED ORDER — LISINOPRIL 20 MG PO TABS: 20.0000 mg | ORAL_TABLET | Freq: Every day | ORAL | 0 refills | Status: DC

## 2019-11-12 MED ORDER — ATENOLOL-CHLORTHALIDONE 100-25 MG PO TABS: 0.5000 | ORAL_TABLET | Freq: Every day | ORAL | 0 refills | Status: DC

## 2019-11-12 NOTE — Telephone Encounter (Signed)
Rescheduled per 2/1 sch msg, pt req. Called and spoke with Olena Mater (daughter), confirmed 2/8 appt

## 2019-11-12 NOTE — Telephone Encounter (Signed)
What is the name of the medication? lisinopril 20 ms and sinvastatin 20 mg and atenolol-chlorthalidone (TENORETIC) 100-25 MG tablet needs refill     Have you contacted your pharmacy to request a refill? This is the pharmacy [facility]  Which pharmacy would you like this sent to?   Patient notified that their request is being sent to the clinical staff for review and that they should receive a call once it is complete. If they do not receive a call within 72 hours they can check with their pharmacy or our office.

## 2019-11-12 NOTE — Progress Notes (Signed)
Patient Care Team: Horald Pollen, MD as PCP - General (Internal Medicine) Mauro Kaufmann, RN as Oncology Nurse Navigator Rockwell Germany, RN as Oncology Nurse Navigator Nicholas Lose, MD as Consulting Physician (Hematology and Oncology) Eppie Gibson, MD as Attending Physician (Radiation Oncology) Alphonsa Overall, MD as Consulting Physician (General Surgery)  DIAGNOSIS:    ICD-10-CM   1. Malignant neoplasm of upper-outer quadrant of left breast in female, estrogen receptor negative ([place])  C50.412    Z17.1     SUMMARY OF ONCOLOGIC HISTORY: Oncology History  Malignant neoplasm of upper-outer quadrant of left breast in female, estrogen receptor negative ([place])  09/30/2019 Initial Diagnosis   Patient palpated left breast mass. Mammogram showed a 2.3cm mass at the 2 o'clock position, normal-appearing axillary lymph nodes. Biopsy showed IDC, grade 3, HER-2 + (3+), ER/PR -, Ki67 40%.   10/02/2019 Cancer Staging   Staging form: Breast, AJCC 8th Edition - Clinical stage from 10/02/2019: Stage IIA (cT2, cN0, cM0, G3, ER-, PR-, HER2+) - Signed by Nicholas Lose, MD on 10/02/2019   10/16/2019 -  Chemotherapy   The patient had ondansetron (ZOFRAN) injection 8 mg, 8 mg (100 % of original dose 8 mg), Intravenous,  Once, 1 of 3 cycles Dose modification: 8 mg (original dose 8 mg, Cycle 1) Administration: 8 mg (10/16/2019), 8 mg (10/24/2019), 8 mg (10/31/2019), 8 mg (11/06/2019) PACLitaxel (TAXOL) 180 mg in sodium chloride 0.9 % 250 mL chemo infusion (</= 59m/m2), 80 mg/m2 = 180 mg, Intravenous,  Once, 1 of 3 cycles Administration: 180 mg (10/16/2019), 180 mg (10/24/2019), 180 mg (10/31/2019), 180 mg (11/06/2019) trastuzumab-dkst (OGIVRI) 450 mg in sodium chloride 0.9 % 250 mL chemo infusion, 462 mg, Intravenous,  Once, 1 of 3 cycles Administration: 450 mg (10/16/2019), 231 mg (10/24/2019), 231 mg (10/31/2019), 231 mg (11/06/2019)  for chemotherapy treatment.      CHIEF COMPLIANT: Cycle5Taxol  Herceptin  INTERVAL HISTORY: Veronica KOBLEis a 54y.o. with above-mentioned history of HER-2 positive left breast cancer who is currently on neoadjuvant chemotherapy treatment with weekly Taxol Herceptin.She presents to the clinic todayfor cycle5 and a toxicity check.   She complains of diarrhea 2-4 times a day.  She is also complaining of shortness of breath at rest.  ALLERGIES:  has No Known Allergies.  MEDICATIONS:  Current Outpatient Medications  Medication Sig Dispense Refill  . albuterol (PROVENTIL HFA;VENTOLIN HFA) 108 (90 Base) MCG/ACT inhaler Inhale 2 puffs into the lungs every 3 (three) hours as needed for wheezing (cough, shortness of breath or wheezing.). 1 Inhaler 3  . atenolol-chlorthalidone (TENORETIC) 100-25 MG tablet Take 0.5 tablets by mouth daily. 45 tablet 0  . guaiFENesin-codeine (ROBITUSSIN AC) 100-10 MG/5ML syrup Take 5-10 mLs by mouth 4 (four) times daily as needed for cough. 180 mL 0  . ibuprofen (ADVIL) 800 MG tablet Take 1 tablet (800 mg total) by mouth every 8 (eight) hours as needed. 30 tablet 0  . levothyroxine (SYNTHROID) 100 MCG tablet Take 1 tablet (100 mcg total) by mouth daily. 30 tablet 1  . lidocaine-prilocaine (EMLA) cream Apply to affected area once 30 g 3  . lisinopril (ZESTRIL) 20 MG tablet Take 1 tablet (20 mg total) by mouth daily. 90 tablet 0  . LORazepam (ATIVAN) 0.5 MG tablet Take 1 tablet (0.5 mg total) by mouth every 6 (six) hours as needed (Nausea or vomiting). 30 tablet 0  . methocarbamol (ROBAXIN) 500 MG tablet Take 1 tablet (500 mg total) by mouth 4 (four) times daily. 3[place]  tablet 0  . ondansetron (ZOFRAN) 8 MG tablet Take 1 tablet (8 mg total) by mouth 2 (two) times daily as needed (Nausea or vomiting). 30 tablet 1  . prochlorperazine (COMPAZINE) 10 MG tablet Take 1 tablet (10 mg total) by mouth every 6 (six) hours as needed (Nausea or vomiting). 30 tablet 1  . simvastatin (ZOCOR) 20 MG tablet TAKE 1 TABLET (20 MG TOTAL) BY MOUTH DAILY.  NEEDS OFFICE VISIT FOR FURTHER REFILLS 90 tablet 0  . traMADol (ULTRAM) 50 MG tablet Take 1 tablet (50 mg total) by mouth every 6 (six) hours as needed for moderate pain or severe pain. 10 tablet 0   No current facility-administered medications for this visit.    PHYSICAL EXAMINATION: ECOG PERFORMANCE STATUS: 1 - Symptomatic but completely ambulatory  Vitals:   11/13/19 0820  BP: (!) 133/93  Pulse: 77  Resp: 19  Temp: 98 F (36.7 C)  SpO2: 100%   Filed Weights   11/13/19 0820  Weight: 250 lb 12.8 oz (113.8 kg)    LABORATORY DATA:  I have reviewed the data as listed CMP Latest Ref Rng & Units 11/06/2019 10/31/2019 10/24/2019  Glucose 70 - 99 mg/dL 174(H) 105(H) 107(H)  BUN 6 - 20 mg/dL 16 15 23(H)  Creatinine 0.44 - 1.00 mg/dL 0.81 0.69 0.80  Sodium 135 - 145 mmol/L 135 134(L) 136  Potassium 3.5 - 5.1 mmol/L 3.5 3.9 3.6  Chloride 98 - 111 mmol/L 97(L) 99 100  CO2 22 - 32 mmol/L _0 Calcium 8.9 - 10.3 mg/dL 9.5 9.4 9.2  Total Protein 6.5 - 8.1 g/dL 7.3 7.4 7.5  Total Bilirubin 0.3 - 1.2 mg/dL 0.5 0.4 0.4  Alkaline Phos 38 - 126 U/L 89 99 97  AST 15 - 41 U/L _1 ALT 0 - 44 U/L _2 Lab Results  Component Value Date   WBC 4.8 11/13/2019   HGB 11.1 (L) 11/13/2019   HCT 34.3 (L) 11/13/2019   MCV 74.7 (L) 11/13/2019   PLT 473 (H) 11/13/2019   NEUTROABS 2.7 11/13/2019    ASSESSMENT & PLAN:  Malignant neoplasm of upper-outer quadrant of left breast in female, estrogen receptor negative ([place]) 09/30/2019:Patient palpated left breast mass. Mammogram showed a 2.3cm mass at the 2 o'clock position, normal-appearing axillary lymph nodes. Biopsy showed IDC, grade 3, HER-2 + (3+), ER/PR -, Ki67 40%. T2 N0 stage IIa clinical stage  Treatment plan: 1.Neoadjuvant chemotherapy with Taxol-Herceptin weekly X 21fllowed by Herceptin maintenance for 1 year versus Kadcyla maintenance depending on the final response 2.Breast conserving surgery with sentinel lymph  node biopsy 3.Adjuvant radiation ----------------------------------------------------------------------------------------------------------------------------------------------------- Breast MRIneoadjuvantly not done per patient preference Current treatment: Cycle6Taxol Herceptin Echocardiogram 10/14/2019: EF 60 to 65%  Chemo toxicities: 1.    Moderate fatigue: It appears to be getting worse and she is short of breath even at rest. 2.  Chemo-induced peripheral neuropathy: Sitting up on the tips of the fingers and toes.  It comes and goes at this time.  We will reduce the dosage of Taxol 3.  Chemotherapy-induced anemia: Hemoglobin is 11.1 4.  Diarrhea: Instructed to take Imodium and also take yogurt.  Make sure she is hydrating well.  Return to clinic weekly for labs and follow-up  and every other week for follow-up with me  No orders of the defined types were placed in this encounter.  The patient has a good understanding of the overall plan. she agrees with it. she will call  with any problems that may develop before the next visit here.  Total time spent: 30 mins including face to face time and time spent for planning, charting and coordination of care  Rulon Eisenmenger, MD, MPH 11/13/2019  I, Cloyde Reams Dorshimer, am acting as scribe for Dr. Nicholas Lose.  I have reviewed the above documentation for accuracy and completeness, and I agree with the above.

## 2019-11-12 NOTE — Telephone Encounter (Signed)
Refills has been sent. 

## 2019-11-13 ENCOUNTER — Inpatient Hospital Stay: Payer: BC Managed Care – PPO | Attending: Hematology and Oncology

## 2019-11-13 ENCOUNTER — Other Ambulatory Visit: Payer: Managed Care, Other (non HMO)

## 2019-11-13 ENCOUNTER — Other Ambulatory Visit: Payer: Self-pay

## 2019-11-13 ENCOUNTER — Inpatient Hospital Stay: Payer: BC Managed Care – PPO | Admitting: Hematology and Oncology

## 2019-11-13 ENCOUNTER — Ambulatory Visit: Payer: Managed Care, Other (non HMO)

## 2019-11-13 ENCOUNTER — Inpatient Hospital Stay: Payer: BC Managed Care – PPO

## 2019-11-13 DIAGNOSIS — Z95828 Presence of other vascular implants and grafts: Secondary | ICD-10-CM

## 2019-11-13 DIAGNOSIS — Z171 Estrogen receptor negative status [ER-]: Secondary | ICD-10-CM

## 2019-11-13 DIAGNOSIS — C50412 Malignant neoplasm of upper-outer quadrant of left female breast: Secondary | ICD-10-CM

## 2019-11-13 DIAGNOSIS — R0602 Shortness of breath: Secondary | ICD-10-CM | POA: Insufficient documentation

## 2019-11-13 DIAGNOSIS — Z5111 Encounter for antineoplastic chemotherapy: Secondary | ICD-10-CM | POA: Insufficient documentation

## 2019-11-13 DIAGNOSIS — Z79899 Other long term (current) drug therapy: Secondary | ICD-10-CM | POA: Diagnosis not present

## 2019-11-13 DIAGNOSIS — R197 Diarrhea, unspecified: Secondary | ICD-10-CM | POA: Insufficient documentation

## 2019-11-13 LAB — CBC WITH DIFFERENTIAL (CANCER CENTER ONLY)
Abs Immature Granulocytes: 0.03 10*3/uL (ref 0.00–0.07)
Basophils Absolute: 0 10*3/uL (ref 0.0–0.1)
Basophils Relative: 1 %
Eosinophils Absolute: 0.1 10*3/uL (ref 0.0–0.5)
Eosinophils Relative: 2 %
HCT: 34.3 % — ABNORMAL LOW (ref 36.0–46.0)
Hemoglobin: 11.1 g/dL — ABNORMAL LOW (ref 12.0–15.0)
Immature Granulocytes: 1 %
Lymphocytes Relative: 33 %
Lymphs Abs: 1.6 10*3/uL (ref 0.7–4.0)
MCH: 24.2 pg — ABNORMAL LOW (ref 26.0–34.0)
MCHC: 32.4 g/dL (ref 30.0–36.0)
MCV: 74.7 fL — ABNORMAL LOW (ref 80.0–100.0)
Monocytes Absolute: 0.3 10*3/uL (ref 0.1–1.0)
Monocytes Relative: 7 %
Neutro Abs: 2.7 10*3/uL (ref 1.7–7.7)
Neutrophils Relative %: 56 %
Platelet Count: 473 10*3/uL — ABNORMAL HIGH (ref 150–400)
RBC: 4.59 MIL/uL (ref 3.87–5.11)
RDW: 17.5 % — ABNORMAL HIGH (ref 11.5–15.5)
WBC Count: 4.8 10*3/uL (ref 4.0–10.5)
nRBC: 0 % (ref 0.0–0.2)

## 2019-11-13 LAB — CMP (CANCER CENTER ONLY)
ALT: 21 U/L (ref 0–44)
AST: 17 U/L (ref 15–41)
Albumin: 3.5 g/dL (ref 3.5–5.0)
Alkaline Phosphatase: 85 U/L (ref 38–126)
Anion gap: 8 (ref 5–15)
BUN: 13 mg/dL (ref 6–20)
CO2: 27 mmol/L (ref 22–32)
Calcium: 9.2 mg/dL (ref 8.9–10.3)
Chloride: 97 mmol/L — ABNORMAL LOW (ref 98–111)
Creatinine: 0.78 mg/dL (ref 0.44–1.00)
GFR, Est AFR Am: >60 mL/min (ref >60)
GFR, Estimated: >60 mL/min (ref >60)
Glucose, Bld: 194 mg/dL — ABNORMAL HIGH (ref 70–99)
Potassium: 3.5 mmol/L (ref 3.5–5.1)
Sodium: 132 mmol/L — ABNORMAL LOW (ref 135–145)
Total Bilirubin: 0.5 mg/dL (ref 0.3–1.2)
Total Protein: 7.1 g/dL (ref 6.5–8.1)

## 2019-11-13 MED ORDER — DIPHENHYDRAMINE HCL 50 MG/ML IJ SOLN
25.0000 mg | Freq: Once | INTRAMUSCULAR | Status: AC
  Administered 2019-11-13: 25 mg via INTRAVENOUS

## 2019-11-13 MED ORDER — DIPHENHYDRAMINE HCL 50 MG/ML IJ SOLN
INTRAMUSCULAR | Status: AC
  Filled 2019-11-13: qty 1

## 2019-11-13 MED ORDER — ONDANSETRON HCL 4 MG/2ML IJ SOLN
8.0000 mg | Freq: Once | INTRAMUSCULAR | Status: AC
  Administered 2019-11-13: 8 mg via INTRAVENOUS

## 2019-11-13 MED ORDER — SODIUM CHLORIDE 0.9 % IV SOLN
65.0000 mg/m2 | Freq: Once | INTRAVENOUS | Status: AC
  Administered 2019-11-13: 150 mg via INTRAVENOUS
  Filled 2019-11-13: qty 25

## 2019-11-13 MED ORDER — SODIUM CHLORIDE 0.9 % IV SOLN
Freq: Once | INTRAVENOUS | Status: AC
  Filled 2019-11-13: qty 250

## 2019-11-13 MED ORDER — SODIUM CHLORIDE 0.9% FLUSH
10.0000 mL | INTRAVENOUS | Status: DC | PRN
  Filled 2019-11-13: qty 10

## 2019-11-13 MED ORDER — ACETAMINOPHEN 325 MG PO TABS
650.0000 mg | ORAL_TABLET | Freq: Once | ORAL | Status: AC
  Administered 2019-11-13: 650 mg via ORAL

## 2019-11-13 MED ORDER — ACETAMINOPHEN 325 MG PO TABS
ORAL_TABLET | ORAL | Status: AC
  Filled 2019-11-13: qty 2

## 2019-11-13 MED ORDER — DEXAMETHASONE SODIUM PHOSPHATE 10 MG/ML IJ SOLN
10.0000 mg | Freq: Once | INTRAMUSCULAR | Status: AC
  Administered 2019-11-13: 10 mg via INTRAVENOUS

## 2019-11-13 MED ORDER — ONDANSETRON HCL 4 MG/2ML IJ SOLN
INTRAMUSCULAR | Status: AC
  Filled 2019-11-13: qty 4

## 2019-11-13 MED ORDER — HEPARIN SOD (PORK) LOCK FLUSH 100 UNIT/ML IV SOLN
500.0000 [IU] | Freq: Once | INTRAVENOUS | Status: DC | PRN
  Filled 2019-11-13: qty 5

## 2019-11-13 MED ORDER — FAMOTIDINE IN NACL 20-0.9 MG/50ML-% IV SOLN
20.0000 mg | Freq: Once | INTRAVENOUS | Status: AC
  Administered 2019-11-13: 20 mg via INTRAVENOUS

## 2019-11-13 MED ORDER — FAMOTIDINE IN NACL 20-0.9 MG/50ML-% IV SOLN
INTRAVENOUS | Status: AC
  Filled 2019-11-13: qty 50

## 2019-11-13 MED ORDER — DEXAMETHASONE SODIUM PHOSPHATE 10 MG/ML IJ SOLN
INTRAMUSCULAR | Status: AC
  Filled 2019-11-13: qty 1

## 2019-11-13 MED ORDER — SODIUM CHLORIDE 0.9% FLUSH
10.0000 mL | Freq: Once | INTRAVENOUS | Status: AC
  Administered 2019-11-13: 08:00:00 10 mL
  Filled 2019-11-13: qty 10

## 2019-11-13 MED ORDER — TRASTUZUMAB-DKST CHEMO 150 MG IV SOLR
2.0000 mg/kg | Freq: Once | INTRAVENOUS | Status: AC
  Administered 2019-11-13: 10:00:00 231 mg via INTRAVENOUS
  Filled 2019-11-13: qty 11

## 2019-11-13 NOTE — Patient Instructions (Signed)
[place] [facility] Discharge Instructions for Patients Receiving Chemotherapy  Today you received the following chemotherapy agents Og[place]ri, Taxol.  To help prevent nausea and vomiting after your treatment, we encourage you to take your nausea medication    If you develop nausea and vomiting that is not controlled by your nausea medication, call the clinic.   BELOW ARE SYMPTOMS THAT SHOULD BE REPORTED IMMEDIATELY:  *FEVER GREATER THAN 100.5 F  *CHILLS WITH OR WITHOUT FEVER  NAUSEA AND VOMITING THAT IS NOT CONTROLLED WITH YOUR NAUSEA MEDICATION  *UNUSUAL SHORTNESS OF BREATH  *UNUSUAL BRUISING OR BLEEDING  TENDERNESS IN MOUTH AND THROAT WITH OR WITHOUT PRESENCE OF ULCERS  *URINARY PROBLEMS  *BOWEL PROBLEMS  UNUSUAL RASH Items with * indicate a potential emergency and should be followed up as soon as possible.  Feel free to call the clinic should you have any questions or concerns. The clinic phone number is [phone].  Please show the [place] at check-in to the Emergency Department and triage nurse.

## 2019-11-13 NOTE — Assessment & Plan Note (Signed)
09/30/2019:Patient palpated left breast mass. Mammogram showed a 2.3cm mass at the 2 o'clock position, normal-appearing axillary lymph nodes. Biopsy showed IDC, grade 3, HER-2 + (3+), ER/PR -, Ki67 40%. T2 N0 stage IIa clinical stage  Treatment plan: 1.Neoadjuvant chemotherapy with Taxol-Herceptin weekly X 24fllowed by Herceptin maintenance for 1 year versus Kadcyla maintenance depending on the final response 2.Breast conserving surgery with sentinel lymph node biopsy 3.Adjuvant radiation ----------------------------------------------------------------------------------------------------------------------------------------------------- Breast MRIneoadjuvantly not done per patient preference Current treatment: Cycle6Taxol Herceptin Echocardiogram 10/14/2019: EF 60 to 65%  Chemo toxicities: 1.  Mild fatigue Monitoring for peripheral neuropathy 2.  2 episodes of blurring of vision: Monitoring closely.  It could be blood pressure related.   Intermittent neck pain: Due to port Return to clinic weekly for labs and follow-up  and every other week for follow-up with me

## 2019-11-13 NOTE — Patient Instructions (Signed)

## 2019-11-14 ENCOUNTER — Telehealth: Payer: Self-pay | Admitting: Hematology and Oncology

## 2019-11-14 NOTE — Telephone Encounter (Signed)
I talk with patient regarding schedule  

## 2019-11-18 ENCOUNTER — Encounter: Payer: Self-pay | Admitting: Licensed Clinical Social Worker

## 2019-11-18 ENCOUNTER — Ambulatory Visit (HOSPITAL_BASED_OUTPATIENT_CLINIC_OR_DEPARTMENT_OTHER): Payer: BC Managed Care – PPO | Admitting: Licensed Clinical Social Worker

## 2019-11-18 DIAGNOSIS — Z171 Estrogen receptor negative status [ER-]: Secondary | ICD-10-CM

## 2019-11-18 DIAGNOSIS — C50412 Malignant neoplasm of upper-outer quadrant of left female breast: Secondary | ICD-10-CM

## 2019-11-18 NOTE — Progress Notes (Signed)
REFERRING PROVIDER: Nicholas Lose, MD [address] [place],  [place] [address]  PRIMARY PROVIDER:  Horald Pollen, MD  PRIMARY REASON FOR VISIT:  1. Malignant neoplasm of upper-outer quadrant of left breast in female, estrogen receptor negative ([place])     I connected with Ms. Reddy and her daughter, Lilyan Punt, on 11/18/2019 at 2:00 PM EDT by Webex and verified that I am speaking with the correct person using two identifiers.    Patient location: home Provider location: office  HISTORY OF PRESENT ILLNESS:   Ms. Mccraw, a 54 y.o. female, was seen for a [place] cancer genetics consultation at the request of Dr. Lindi Adie due to a personal history of cancer..  Ms. Ranes presents to clinic today to discuss the possibility of a hereditary predisposition to cancer, genetic testing, and to further clarify her future cancer risks, as well as potential cancer risks for family members.   In 2020, at the age of 49, Ms. Worden was diagnosed with invasive ductal carcinoma of the left breast, ER/PR+, Her2-. The treatment plan includes neoadjuvant chemotherapy, surgery and radiation.   CANCER HISTORY:  Oncology History  Malignant neoplasm of upper-outer quadrant of left breast in female, estrogen receptor negative ([place])  09/30/2019 Initial Diagnosis   Patient palpated left breast mass. Mammogram showed a 2.3cm mass at the 2 o'clock position, normal-appearing axillary lymph nodes. Biopsy showed IDC, grade 3, HER-2 + (3+), ER/PR -, Ki67 40%.   10/02/2019 Cancer Staging   Staging form: Breast, AJCC 8th Edition - Clinical stage from 10/02/2019: Stage IIA (cT2, cN0, cM0, G3, ER-, PR-, HER2+) - Signed by Nicholas Lose, MD on 10/02/2019   10/16/2019 -  Chemotherapy   The patient had ondansetron (ZOFRAN) injection 8 mg, 8 mg (100 % of original dose 8 mg), Intravenous,  Once, 2 of 3 cycles Dose modification: 8 mg (original dose 8 mg, Cycle 1) Administration: 8 mg (10/16/2019), 8 mg  (10/24/2019), 8 mg (10/31/2019), 8 mg (11/06/2019), 8 mg (11/13/2019) PACLitaxel (TAXOL) 180 mg in sodium chloride 0.9 % 250 mL chemo infusion (</= 73m/m2), 80 mg/m2 = 180 mg, Intravenous,  Once, 2 of 3 cycles Dose modification: 65 mg/m2 (original dose 80 mg/m2, Cycle 2, Reason: Dose not tolerated) Administration: 180 mg (10/16/2019), 180 mg (10/24/2019), 150 mg (11/13/2019), 180 mg (10/31/2019), 180 mg (11/06/2019) trastuzumab-dkst (OGIVRI) 450 mg in sodium chloride 0.9 % 250 mL chemo infusion, 462 mg, Intravenous,  Once, 2 of 3 cycles Administration: 450 mg (10/16/2019), 231 mg (10/24/2019), 231 mg (11/13/2019), 231 mg (10/31/2019), 231 mg (11/06/2019)  for chemotherapy treatment.       RISK FACTORS:  Menarche was at age 54  First live birth at age 54  OCP use for approximately 0 years.  Ovaries intact: unsure.  Hysterectomy: yes.  Colonoscopy: no; not examined. Mammogram within the last year: yes. Number of breast biopsies: 1. Up to date with pelvic exams: no. Any excessive radiation exposure in the past: no  Past Medical History:  Diagnosis Date  . Asthma   . Breast mass, left 08/13/2019  . Depression   . Hyperglycemia   . Hypertension   . Hypothyroidism   . Microcytosis   . Obesity   . Prediabetes   . Trapezius muscle spasm 08/09/2019    Past Surgical History:  Procedure Laterality Date  . ABDOMINAL HYSTERECTOMY    . PORTACATH PLACEMENT Right 10/15/2019   Procedure: INSERTION PORT-A-CATH WITH ULTRASOUND;  Surgeon: NAlphonsa Overall MD;  [facility]  Service: General;  Laterality: Right;    Social History   Socioeconomic History  . Marital status: Married    Spouse name: Ghulam  . Number of children: 3  . Years of education: 7  . Highest education level: Not on file  Occupational History  . Occupation: homemaker  Tobacco Use  . Smoking status: Never Smoker  . Smokeless tobacco: Never Used  Substance and Sexual Activity  . Alcohol use: No  . Drug use:  No  . Sexual activity: Yes    Partners: Male  Other Topics Concern  . Not on file  Social History Narrative   Lives with her husband and two daughters.  They care for their 4 year-old granddaughter during the day while her mother (their daughter) works.   Social Determinants of Health   Financial Resource Strain:   . Difficulty of Paying Living Expenses: Not on file  Food Insecurity:   . Worried About Running Out of Food in the Last Year: Not on file  . Ran Out of Food in the Last Year: Not on file  Transportation Needs:   . Lack of Transportation (Medical): Not on file  . Lack of Transportation (Non-Medical): Not on file  Physical Activity:   . Days of Exercise per Week: Not on file  . Minutes of Exercise per Session: Not on file  Stress:   . Feeling of Stress : Not on file  Social Connections:   . Frequency of Communication with Friends and Family: Not on file  . Frequency of Social Gatherings with Friends and Family: Not on file  . Attends Religious Services: Not on file  . Active Member of Clubs or Organizations: Not on file  . Attends Club or Organization Meetings: Not on file  . Marital Status: Not on file     FAMILY HISTORY:  We obtained a detailed, 4-generation family history.  Significant diagnoses are listed below: Family History  Problem Relation Age of Onset  . Diabetes Brother        drug-induced   Ms. Roker has 3 daughters, ages 33, 31 and 28, no cancers. The rest of her family is in [place], and they have limited information about anyone's medical history. Patient's daughter expresses that there could be cancer in the family but they do not know because it might have gone untreated.  Patient had 2 sisters and 2 brothers. One of her sisters did pass in her 30s/40s, patient and daughter are unsure if it was cancer or not. No known cancers in patient's nieces/nephews.   Ms. Tanzi's mother died at 75, no known cancer history. Patient had 2 maternal aunts,  2 maternal uncles. No known cancers. No known cancers in maternal cousins. Maternal grandparents are deceased, and died over the age of 50.  Ms. Joa's father died at 90, no cancer history. Patient had 2 paternal uncles and 1 paternal aunt, no cancers. No known cancers in paternal cousins. No information about paternal grandparents.  Ms. Kimberling is unaware of previous family history of genetic testing for hereditary cancer risks. Patient's family is from [place]. There is no reported Ashkenazi Jewish ancestry. There is no known consanguinity.  GENETIC COUNSELING ASSESSMENT: Ms. Borghi is a 53 y.o. female with a personal and family history which is not particularly suggestive of a hereditary cancer syndrome and predisposition to cancer, however knowledge of family medical history is limited/unknown. We, therefore, discussed and recommended the following at today's visit.   DISCUSSION: We discussed that 5 - 10% of breast cancer   is hereditary, with most cases associated with BRCA1/BRCA2 mutations.  There are other genes that can be associated with hereditary breast cancer syndromes. Ms. Quayle does not meet medical criteria for testing, but she and her daughter still wanted to pursue it as they are concerned about not knowing the family history.We discussed that testing is beneficial for several reasons including surgical decision-making for breast cancer, knowing how to follow individuals after completing their treatment, and understand if other family members could be at risk for cancer and allow them to undergo genetic testing.   We  reviewed the characteristics, features and inheritance patterns of hereditary cancer syndromes. We also discussed genetic testing, including the appropriate family members to test, the process of testing, insurance coverage and turn-around-time for results. We discussed the implications of a negative, positive and/or variant of uncertain significant result. We  recommended Ms. Cipriani pursue genetic testing for the Common Hereditary Cancers gene panel.   Based on Ms. Erno's personal and family history of cancer, she does not meet medical criteria for genetic testing. She will pay the patient pay price of $250 for the testing.   PLAN: After considering the risks, benefits, and limitations, Ms. Buist provided informed consent to pursue genetic testing. She will have her blood drawn on 11/20/2019 and the blood sample will be sent to [facility] for analysis of the Common Hereditary Cancers Panel. Results should be available within approximately 2-3 weeks' time, at which point they will be disclosed by telephone to Ms. Bansal, as will any additional recommendations warranted by these results. Ms. Trivedi will receive a summary of her genetic counseling visit and a copy of her results once available. This information will also be available in Epic.   Ms. Suell's questions were answered to her satisfaction today. Our contact information was provided should additional questions or concerns arise. Thank you for the referral and allowing [place] to share in the care of your patient.   Faith Rogue, MS, [facility] Genetic Counselor [email or web address] Phone: [phone]  The patient was seen for a total of 30 minutes in face-to-face genetic counseling.  Drs. Magrinat, Lindi Adie and/or Burr Medico were available for discussion regarding this case.   _______________________________________________________________________ For Office Staff:  Number of people involved in session: 2 Was an Intern/ student involved with case: no

## 2019-11-19 ENCOUNTER — Encounter: Payer: Self-pay | Admitting: *Deleted

## 2019-11-19 NOTE — Progress Notes (Signed)
Patient Care Team: Horald Pollen, MD as PCP - General (Internal Medicine) Mauro Kaufmann, RN as Oncology Nurse Navigator Rockwell Germany, RN as Oncology Nurse Navigator Nicholas Lose, MD as Consulting Physician (Hematology and Oncology) Eppie Gibson, MD as Attending Physician (Radiation Oncology) Alphonsa Overall, MD as Consulting Physician (General Surgery)  DIAGNOSIS:    ICD-10-CM   1. Malignant neoplasm of upper-outer quadrant of left breast in female, estrogen receptor negative ([place])  C50.412 US BREAST LTD UNI LEFT INC AXILLA   Z17.1 MM DIAG BREAST TOMO UNI LEFT    ECHOCARDIOGRAM COMPLETE    SUMMARY OF ONCOLOGIC HISTORY: Oncology History  Malignant neoplasm of upper-outer quadrant of left breast in female, estrogen receptor negative ([place])  09/30/2019 Initial Diagnosis   Patient palpated left breast mass. Mammogram showed a 2.3cm mass at the 2 o'clock position, normal-appearing axillary lymph nodes. Biopsy showed IDC, grade 3, HER-2 + (3+), ER/PR -, Ki67 40%.   10/02/2019 Cancer Staging   Staging form: Breast, AJCC 8th Edition - Clinical stage from 10/02/2019: Stage IIA (cT2, cN0, cM0, G3, ER-, PR-, HER2+) - Signed by Nicholas Lose, MD on 10/02/2019   10/16/2019 -  Chemotherapy   The patient had ondansetron (ZOFRAN) injection 8 mg, 8 mg (100 % of original dose 8 mg), Intravenous,  Once, 2 of 2 cycles Dose modification: 8 mg (original dose 8 mg, Cycle 1) Administration: 8 mg (10/16/2019), 8 mg (10/24/2019), 8 mg (10/31/2019), 8 mg (11/06/2019), 8 mg (11/13/2019) PACLitaxel (TAXOL) 180 mg in sodium chloride 0.9 % 250 mL chemo infusion (</= '80mg'$ /m2), 80 mg/m2 = 180 mg, Intravenous,  Once, 2 of 2 cycles Dose modification: 65 mg/m2 (original dose 80 mg/m2, Cycle 2, Reason: Dose not tolerated) Administration: 180 mg (10/16/2019), 180 mg (10/24/2019), 150 mg (11/13/2019), 180 mg (10/31/2019), 180 mg (11/06/2019) trastuzumab-dkst (OGIVRI) 450 mg in sodium chloride 0.9 % 250 mL chemo infusion,  462 mg, Intravenous,  Once, 2 of 2 cycles Dose modification: 6 mg/kg (original dose 2 mg/kg, Cycle 2, Reason: Provider Judgment) Administration: 450 mg (10/16/2019), 231 mg (10/24/2019), 231 mg (11/13/2019), 231 mg (10/31/2019), 231 mg (11/06/2019)  for chemotherapy treatment.      CHIEF COMPLIANT: Cycle6Taxol Herceptin  INTERVAL HISTORY: Veronica Velazquez is a 54 y.o. with above-mentioned history of HER-2 positive left breast cancer who is currently on neoadjuvant chemotherapy with weekly Taxol Herceptin.She presents to the clinic todayfor cycle6and a toxicity check. She has noticed a worsening neuropathy in her hands and feet.  She has not been dropping objects but is unable to feel with the tips of her fingers.  She is also gotten more fatigued as well as shortness of breath to exertion.  ALLERGIES:  has No Known Allergies.  MEDICATIONS:  Current Outpatient Medications  Medication Sig Dispense Refill  . albuterol (PROVENTIL HFA;VENTOLIN HFA) 108 (90 Base) MCG/ACT inhaler Inhale 2 puffs into the lungs every 3 (three) hours as needed for wheezing (cough, shortness of breath or wheezing.). 1 Inhaler 3  . atenolol-chlorthalidone (TENORETIC) 100-25 MG tablet Take 0.5 tablets by mouth daily. 45 tablet 0  . guaiFENesin-codeine (ROBITUSSIN AC) 100-10 MG/5ML syrup Take 5-10 mLs by mouth 4 (four) times daily as needed for cough. 180 mL 0  . ibuprofen (ADVIL) 800 MG tablet Take 1 tablet (800 mg total) by mouth every 8 (eight) hours as needed. 30 tablet 0  . levothyroxine (SYNTHROID) 100 MCG tablet Take 1 tablet (100 mcg total) by mouth daily. 30 tablet 1  . lidocaine-prilocaine (EMLA) cream Apply  to affected area once 30 g 3  . lisinopril (ZESTRIL) 20 MG tablet Take 1 tablet (20 mg total) by mouth daily. 90 tablet 0  . LORazepam (ATIVAN) 0.5 MG tablet Take 1 tablet (0.5 mg total) by mouth every 6 (six) hours as needed (Nausea or vomiting). 30 tablet 0  . methocarbamol (ROBAXIN) 500 MG tablet Take 1  tablet (500 mg total) by mouth 4 (four) times daily. 30 tablet 0  . ondansetron (ZOFRAN) 8 MG tablet Take 1 tablet (8 mg total) by mouth 2 (two) times daily as needed (Nausea or vomiting). 30 tablet 1  . prochlorperazine (COMPAZINE) 10 MG tablet Take 1 tablet (10 mg total) by mouth every 6 (six) hours as needed (Nausea or vomiting). 30 tablet 1  . simvastatin (ZOCOR) 20 MG tablet TAKE 1 TABLET (20 MG TOTAL) BY MOUTH DAILY. NEEDS OFFICE VISIT FOR FURTHER REFILLS 90 tablet 0  . traMADol (ULTRAM) 50 MG tablet Take 1 tablet (50 mg total) by mouth every 6 (six) hours as needed for moderate pain or severe pain. 10 tablet 0   No current facility-administered medications for this visit.    PHYSICAL EXAMINATION: ECOG PERFORMANCE STATUS: 2 - Symptomatic, <50% confined to bed  Vitals:   11/20/19 0835  BP: 113/87  Pulse: 71  Resp: 18  Temp: 97.8 F (36.6 C)  SpO2: 100%   Filed Weights   11/20/19 0835  Weight: 253 lb 12.8 oz (115.1 kg)    LABORATORY DATA:  I have reviewed the data as listed CMP Latest Ref Rng & Units 11/13/2019 11/06/2019 10/31/2019  Glucose 70 - 99 mg/dL 194(H) 174(H) 105(H)  BUN 6 - 20 mg/dL '13 16 15  '$ Creatinine 0.44 - 1.00 mg/dL 0.78 0.81 0.69  Sodium 135 - 145 mmol/L 132(L) 135 134(L)  Potassium 3.5 - 5.1 mmol/L 3.5 3.5 3.9  Chloride 98 - 111 mmol/L 97(L) 97(L) 99  CO2 22 - 32 mmol/L '27 28 26  '$ Calcium 8.9 - 10.3 mg/dL 9.2 9.5 9.4  Total Protein 6.5 - 8.1 g/dL 7.1 7.3 7.4  Total Bilirubin 0.3 - 1.2 mg/dL 0.5 0.5 0.4  Alkaline Phos 38 - 126 U/L 85 89 99  AST 15 - 41 U/L '17 15 22  '$ ALT 0 - 44 U/L '21 16 18    '$ Lab Results  Component Value Date   WBC 5.8 11/20/2019   HGB 11.4 (L) 11/20/2019   HCT 35.0 (L) 11/20/2019   MCV 76.6 (L) 11/20/2019   PLT 444 (H) 11/20/2019   NEUTROABS 3.4 11/20/2019    ASSESSMENT & PLAN:  Malignant neoplasm of upper-outer quadrant of left breast in female, estrogen receptor negative ([place]) 09/30/2019:Patient palpated left breast mass.  Mammogram showed a 2.3cm mass at the 2 o'clock position, normal-appearing axillary lymph nodes. Biopsy showed IDC, grade 3, HER-2 + (3+), ER/PR -, Ki67 40%. T2 N0 stage IIa clinical stage  Treatment plan: 1.Neoadjuvant chemotherapy with Taxol-Herceptin weekly X 41fllowed by Herceptin maintenance for 1 year versus Kadcyla maintenance depending on the final response 2.Breast conserving surgery with sentinel lymph node biopsy 3.Adjuvant radiation ----------------------------------------------------------------------------------------------------------------------------------------------------- Breast MRIneoadjuvantly not done per patient preference Current treatment: Completed 5 cycles of Taxol Herceptin.  Taxol being discontinued.  Herceptin maintenance starting today. Echocardiogram 10/14/2019: EF 60 to 65%  Chemo toxicities: 1.  Moderate fatigue: Stable 2.  Chemo-induced peripheral neuropathy: In spite of lowering the dosage of Taxol she had profound neuropathy developed.  Because of this we will discontinue Taxol today. 3.  Chemotherapy-induced anemia: Hemoglobin is 11.4  4.  Diarrhea: Imodium and probiotics  I sent a message to Dr. Lucia Gaskins to bring her in for surgical evaluation. We will obtain a mammogram ultrasound next week. For her shortness of breath we will obtain an echocardiogram as well. Elevated blood sugars: I discussed the importance of her diet control.  Return to clinic every 3 weeks for Herceptin.     Orders Placed This Encounter  Procedures  . US BREAST LTD UNI LEFT INC AXILLA    Standing Status:   Future    Standing Expiration Date:   01/17/2021    Order Specific Question:   Reason for Exam (SYMPTOM  OR DIAGNOSIS REQUIRED)    Answer:   Post neoadjuvant imaging    Order Specific Question:   Preferred imaging location?    Answer:   [facility]    Order Specific Question:   Release to patient    Answer:   Immediate  . MM DIAG BREAST TOMO UNI LEFT      Standing Status:   Future    Standing Expiration Date:   11/19/2020    Order Specific Question:   Reason for Exam (SYMPTOM  OR DIAGNOSIS REQUIRED)    Answer:   post neoadjuvant imaging    Order Specific Question:   Is the patient pregnant?    Answer:   No    Order Specific Question:   Preferred imaging location?    Answer:   [facility]  . ECHOCARDIOGRAM COMPLETE    Standing Status:   Future    Standing Expiration Date:   02/16/2021    Order Specific Question:   Where should this test be performed    Answer:   [place]    Order Specific Question:   Perflutren DEFINITY (image enhancing agent) should be administered unless hypersensitivity or allergy exist    Answer:   Administer Perflutren    Order Specific Question:   Reason for exam-Echo    Answer:   Chemo  V67.2 / Z09    Order Specific Question:   Other Comments    Answer:   Flushing sensation    Order Specific Question:   Release to patient    Answer:   Immediate   The patient has a good understanding of the overall plan. she agrees with it. she will call with any problems that may develop before the next visit here.  Total time spent: 30 mins including face to face time and time spent for planning, charting and coordination of care  Nicholas Lose, MD 11/20/2019  I, Molly Dorshimer, am acting as scribe for Dr. Nicholas Lose.  I have reviewed the above documentation for accuracy and completeness, and I agree with the above.

## 2019-11-20 ENCOUNTER — Encounter: Payer: Self-pay | Admitting: Hematology and Oncology

## 2019-11-20 ENCOUNTER — Inpatient Hospital Stay (HOSPITAL_BASED_OUTPATIENT_CLINIC_OR_DEPARTMENT_OTHER): Payer: BC Managed Care – PPO | Admitting: Hematology and Oncology

## 2019-11-20 ENCOUNTER — Inpatient Hospital Stay: Payer: BC Managed Care – PPO

## 2019-11-20 ENCOUNTER — Other Ambulatory Visit: Payer: Self-pay

## 2019-11-20 DIAGNOSIS — C50412 Malignant neoplasm of upper-outer quadrant of left female breast: Secondary | ICD-10-CM

## 2019-11-20 DIAGNOSIS — R197 Diarrhea, unspecified: Secondary | ICD-10-CM | POA: Diagnosis not present

## 2019-11-20 DIAGNOSIS — Z171 Estrogen receptor negative status [ER-]: Secondary | ICD-10-CM

## 2019-11-20 DIAGNOSIS — Z5111 Encounter for antineoplastic chemotherapy: Secondary | ICD-10-CM | POA: Diagnosis not present

## 2019-11-20 DIAGNOSIS — Z95828 Presence of other vascular implants and grafts: Secondary | ICD-10-CM

## 2019-11-20 DIAGNOSIS — R0602 Shortness of breath: Secondary | ICD-10-CM | POA: Diagnosis not present

## 2019-11-20 DIAGNOSIS — Z79899 Other long term (current) drug therapy: Secondary | ICD-10-CM | POA: Diagnosis not present

## 2019-11-20 LAB — CMP (CANCER CENTER ONLY)
ALT: 20 U/L (ref 0–44)
AST: 13 U/L — ABNORMAL LOW (ref 15–41)
Albumin: 3.6 g/dL (ref 3.5–5.0)
Alkaline Phosphatase: 78 U/L (ref 38–126)
Anion gap: 11 (ref 5–15)
BUN: 15 mg/dL (ref 6–20)
CO2: 27 mmol/L (ref 22–32)
Calcium: 9.2 mg/dL (ref 8.9–10.3)
Chloride: 97 mmol/L — ABNORMAL LOW (ref 98–111)
Creatinine: 0.7 mg/dL (ref 0.44–1.00)
GFR, Est AFR Am: >60 mL/min (ref >60)
GFR, Estimated: >60 mL/min (ref >60)
Glucose, Bld: 172 mg/dL — ABNORMAL HIGH (ref 70–99)
Potassium: 3.5 mmol/L (ref 3.5–5.1)
Sodium: 135 mmol/L (ref 135–145)
Total Bilirubin: 0.4 mg/dL (ref 0.3–1.2)
Total Protein: 7.2 g/dL (ref 6.5–8.1)

## 2019-11-20 LAB — CBC WITH DIFFERENTIAL (CANCER CENTER ONLY)
Abs Immature Granulocytes: 0.04 10*3/uL (ref 0.00–0.07)
Basophils Absolute: 0 10*3/uL (ref 0.0–0.1)
Basophils Relative: 1 %
Eosinophils Absolute: 0.1 10*3/uL (ref 0.0–0.5)
Eosinophils Relative: 1 %
HCT: 35 % — ABNORMAL LOW (ref 36.0–46.0)
Hemoglobin: 11.4 g/dL — ABNORMAL LOW (ref 12.0–15.0)
Immature Granulocytes: 1 %
Lymphocytes Relative: 29 %
Lymphs Abs: 1.7 10*3/uL (ref 0.7–4.0)
MCH: 24.9 pg — ABNORMAL LOW (ref 26.0–34.0)
MCHC: 32.6 g/dL (ref 30.0–36.0)
MCV: 76.6 fL — ABNORMAL LOW (ref 80.0–100.0)
Monocytes Absolute: 0.5 10*3/uL (ref 0.1–1.0)
Monocytes Relative: 9 %
Neutro Abs: 3.4 10*3/uL (ref 1.7–7.7)
Neutrophils Relative %: 59 %
Platelet Count: 444 10*3/uL — ABNORMAL HIGH (ref 150–400)
RBC: 4.57 MIL/uL (ref 3.87–5.11)
RDW: 18.4 % — ABNORMAL HIGH (ref 11.5–15.5)
WBC Count: 5.8 10*3/uL (ref 4.0–10.5)
nRBC: 0 % (ref 0.0–0.2)

## 2019-11-20 LAB — GENETIC SCREENING ORDER

## 2019-11-20 MED ORDER — ACETAMINOPHEN 325 MG PO TABS
650.0000 mg | ORAL_TABLET | Freq: Once | ORAL | Status: AC
  Administered 2019-11-20: 650 mg via ORAL

## 2019-11-20 MED ORDER — TRASTUZUMAB-DKST CHEMO 150 MG IV SOLR
6.0000 mg/kg | Freq: Once | INTRAVENOUS | Status: AC
  Administered 2019-11-20: 693 mg via INTRAVENOUS
  Filled 2019-11-20: qty 33

## 2019-11-20 MED ORDER — ACETAMINOPHEN 325 MG PO TABS
ORAL_TABLET | ORAL | Status: AC
  Filled 2019-11-20: qty 2

## 2019-11-20 MED ORDER — DIPHENHYDRAMINE HCL 50 MG/ML IJ SOLN
INTRAMUSCULAR | Status: AC
  Filled 2019-11-20: qty 1

## 2019-11-20 MED ORDER — SODIUM CHLORIDE 0.9% FLUSH
10.0000 mL | Freq: Once | INTRAVENOUS | Status: AC
  Administered 2019-11-20: 10 mL
  Filled 2019-11-20: qty 10

## 2019-11-20 MED ORDER — DIPHENHYDRAMINE HCL 50 MG/ML IJ SOLN
25.0000 mg | Freq: Once | INTRAMUSCULAR | Status: AC
  Administered 2019-11-20: 25 mg via INTRAVENOUS

## 2019-11-20 MED ORDER — DIPHENHYDRAMINE HCL 25 MG PO CAPS
ORAL_CAPSULE | ORAL | Status: AC
  Filled 2019-11-20: qty 2

## 2019-11-20 MED ORDER — SODIUM CHLORIDE 0.9 % IV SOLN
Freq: Once | INTRAVENOUS | Status: AC
  Filled 2019-11-20: qty 250

## 2019-11-20 MED ORDER — HEPARIN SOD (PORK) LOCK FLUSH 100 UNIT/ML IV SOLN
500.0000 [IU] | Freq: Once | INTRAVENOUS | Status: AC | PRN
  Administered 2019-11-20: 500 [IU]
  Filled 2019-11-20: qty 5

## 2019-11-20 MED ORDER — SODIUM CHLORIDE 0.9% FLUSH
10.0000 mL | INTRAVENOUS | Status: DC | PRN
  Administered 2019-11-20: 10 mL
  Filled 2019-11-20: qty 10

## 2019-11-20 NOTE — Progress Notes (Signed)
Met with patient in treatment room to introduce myself as Arboriculturist and offer available resources.  Patient states her daughter speaks [place]. Gave her my card to have her daughter call me at her earliest convenience to offer one-time $1000 [employer] grant to assist with personal expenses while going through treatment as well as available copay assistance for treatment drug.

## 2019-11-20 NOTE — Assessment & Plan Note (Signed)
09/30/2019:Patient palpated left breast mass. Mammogram showed a 2.3cm mass at the 2 o'clock position, normal-appearing axillary lymph nodes. Biopsy showed IDC, grade 3, HER-2 + (3+), ER/PR -, Ki67 40%. T2 N0 stage IIa clinical stage  Treatment plan: 1.Neoadjuvant chemotherapy with Taxol-Herceptin weekly X 27fllowed by Herceptin maintenance for 1 year versus Kadcyla maintenance depending on the final response 2.Breast conserving surgery with sentinel lymph node biopsy 3.Adjuvant radiation ----------------------------------------------------------------------------------------------------------------------------------------------------- Breast MRIneoadjuvantly not done per patient preference Current treatment: Cycle6Taxol Herceptin Echocardiogram 10/14/2019: EF 60 to 65%  Chemo toxicities: 1.  Moderate fatigue: Stable 2.  Chemo-induced peripheral neuropathy: Intermittent, reduce the dosage of Taxol 3.  Chemotherapy-induced anemia: Hemoglobin is  4.  Diarrhea: Imodium and probiotics  Return to clinic weekly for chemo and follow-ups

## 2019-11-20 NOTE — Patient Instructions (Signed)

## 2019-11-20 NOTE — Progress Notes (Signed)
Sent message to nurse to adjust appmt and review with pt today as she is now on q3w dosing Ogivri and not longer on taxol

## 2019-11-20 NOTE — Patient Instructions (Signed)
[facility] °Discharge Instructions for Patients Receiving Chemotherapy ° °Today you received the following chemotherapy agents Trastuzumab ° °To help prevent nausea and vomiting after your treatment, we encourage you to take your nausea medication as directed. °  °If you develop nausea and vomiting that is not controlled by your nausea medication, call the clinic.  ° °BELOW ARE SYMPTOMS THAT SHOULD BE REPORTED IMMEDIATELY: °· *FEVER GREATER THAN 100.5 F °· *CHILLS WITH OR WITHOUT FEVER °· NAUSEA AND VOMITING THAT IS NOT CONTROLLED WITH YOUR NAUSEA MEDICATION °· *UNUSUAL SHORTNESS OF BREATH °· *UNUSUAL BRUISING OR BLEEDING °· TENDERNESS IN MOUTH AND THROAT WITH OR WITHOUT PRESENCE OF ULCERS °· *URINARY PROBLEMS °· *BOWEL PROBLEMS °· UNUSUAL RASH °Items with * indicate a potential emergency and should be followed up as soon as possible. ° °Feel free to call the clinic should you have any questions or concerns. The clinic phone number is [phone]. ° °Please show the CHEMO ALERT CARD at check-in to the Emergency Department and triage nurse. ° ° °

## 2019-11-21 ENCOUNTER — Telehealth: Payer: Self-pay | Admitting: Hematology and Oncology

## 2019-11-21 NOTE — Telephone Encounter (Signed)
I left a message regarding schedule  

## 2019-11-22 ENCOUNTER — Encounter: Payer: Self-pay | Admitting: *Deleted

## 2019-11-27 ENCOUNTER — Other Ambulatory Visit: Payer: BC Managed Care – PPO

## 2019-11-27 ENCOUNTER — Ambulatory Visit: Payer: BC Managed Care – PPO | Admitting: Adult Health

## 2019-11-27 ENCOUNTER — Ambulatory Visit: Payer: BC Managed Care – PPO

## 2019-11-29 ENCOUNTER — Telehealth: Payer: Self-pay | Admitting: Licensed Clinical Social Worker

## 2019-11-29 ENCOUNTER — Encounter: Payer: Self-pay | Admitting: Licensed Clinical Social Worker

## 2019-11-29 ENCOUNTER — Ambulatory Visit: Payer: Self-pay | Admitting: Licensed Clinical Social Worker

## 2019-11-29 DIAGNOSIS — Z1379 Encounter for other screening for genetic and chromosomal anomalies: Secondary | ICD-10-CM

## 2019-11-29 DIAGNOSIS — C50412 Malignant neoplasm of upper-outer quadrant of left female breast: Secondary | ICD-10-CM

## 2019-11-29 NOTE — Progress Notes (Signed)
HPI:  Ms. Frisby was previously seen in the [facility] due to a personal history of cancer and concerns regarding a hereditary predisposition to cancer. Please refer to our prior cancer genetics clinic note for more information regarding our discussion, assessment and recommendations, at the time. Ms. Poche's recent genetic test results were disclosed to her, as were recommendations warranted by these results. These results and recommendations are discussed in more detail below.  CANCER HISTORY:  Oncology History  Malignant neoplasm of upper-outer quadrant of left breast in female, estrogen receptor negative ([place])  09/30/2019 Initial Diagnosis   Patient palpated left breast mass. Mammogram showed a 2.3cm mass at the 2 o'clock position, normal-appearing axillary lymph nodes. Biopsy showed IDC, grade 3, HER-2 + (3+), ER/PR -, Ki67 40%.   10/02/2019 Cancer Staging   Staging form: Breast, AJCC 8th Edition - Clinical stage from 10/02/2019: Stage IIA (cT2, cN0, cM0, G3, ER-, PR-, HER2+) - Signed by Nicholas Lose, MD on 10/02/2019   10/16/2019 -  Chemotherapy   The patient had ondansetron (ZOFRAN) injection 8 mg, 8 mg (100 % of original dose 8 mg), Intravenous,  Once, 2 of 2 cycles Dose modification: 8 mg (original dose 8 mg, Cycle 1) Administration: 8 mg (10/16/2019), 8 mg (10/24/2019), 8 mg (10/31/2019), 8 mg (11/06/2019), 8 mg (11/13/2019) PACLitaxel (TAXOL) 180 mg in sodium chloride 0.9 % 250 mL chemo infusion (</= 82m/m2), 80 mg/m2 = 180 mg, Intravenous,  Once, 2 of 2 cycles Dose modification: 65 mg/m2 (original dose 80 mg/m2, Cycle 2, Reason: Dose not tolerated) Administration: 180 mg (10/16/2019), 180 mg (10/24/2019), 150 mg (11/13/2019), 180 mg (10/31/2019), 180 mg (11/06/2019) trastuzumab-dkst (OGIVRI) 450 mg in sodium chloride 0.9 % 250 mL chemo infusion, 462 mg, Intravenous,  Once, 2 of 2 cycles Dose modification: 6 mg/kg (original dose 2 mg/kg, Cycle 2, Reason: Provider  Judgment) Administration: 450 mg (10/16/2019), 231 mg (10/24/2019), 231 mg (11/13/2019), 231 mg (10/31/2019), 231 mg (11/06/2019), 693 mg (11/20/2019)  for chemotherapy treatment.     Genetic Testing   Negative genetic testing. No pathogenic variants identified on the [facility] Common Hereditary Cancers Panel. The report date is 11/26/2019.  The Common Hereditary Cancers Panel offered by [facility] includes sequencing and/or deletion duplication testing of the following 48 genes: APC, ATM, AXIN2, BARD1, BMPR1A, BRCA1, BRCA2, BRIP1, CDH1, CDKN2A (p14ARF), CDKN2A (p16INK4a), CKD4, CHEK2, CTNNA1, DICER1, EPCAM (Deletion/duplication testing only), GREM1 (promoter region deletion/duplication testing only), KIT, MEN1, MLH1, MSH2, MSH3, MSH6, MUTYH, NBN, NF1, NHTL1, PALB2, PDGFRA, PMS2, POLD1, POLE, PTEN, RAD50, RAD51C, RAD51D, RNF43, SDHB, SDHC, SDHD, SMAD4, SMARCA4. STK11, TP53, TSC1, TSC2, and VHL.  The following genes were evaluated for sequence changes only: SDHA and HOXB13 c.251G>A variant only.     FAMILY HISTORY:  We obtained a detailed, 4-generation family history.  Significant diagnoses are listed below: Family History  Problem Relation Age of Onset  . Diabetes Brother        drug-induced   Ms. CDuguayhas 3 daughters, ages 356 319and 247 no cancers. [place] and they have limited information about anyone's medical history. Patient's daughter expresses that there could be cancer in the family but they do not know because it might have gone untreated.  Patient had 2 sisters and 2 brothers. One of her sisters did pass in her 30s/40s, patient and daughter are unsure if it was cancer or not. No known cancers in patient's nieces/nephews.   Ms. Olejniczak's mother died at 758  no known cancer history. Patient had 2 maternal aunts, 2 maternal uncles. No known cancers. No known cancers in maternal cousins. Maternal grandparents are deceased, and died over the age of 71.  Ms.  Kia's father died at 11, no cancer history. Patient had 2 paternal uncles and 1 paternal aunt, no cancers. No known cancers in paternal cousins. No information about paternal grandparents.  Ms. Walstad is unaware of previous family history of genetic testing for hereditary cancer risks. Patient's family is from [place]. There is no reported Ashkenazi Jewish ancestry. There is no known consanguinity.   GENETIC TEST RESULTS: Genetic testing reported out on 11/26/2019 through the [facility] Common Hereditary cancer panel found no pathogenic mutations.  The Common Hereditary Cancers Panel offered by [facility] includes sequencing and/or deletion duplication testing of the following 48 genes: APC, ATM, AXIN2, BARD1, BMPR1A, BRCA1, BRCA2, BRIP1, CDH1, CDKN2A (p14ARF), CDKN2A (p16INK4a), CKD4, CHEK2, CTNNA1, DICER1, EPCAM (Deletion/duplication testing only), GREM1 (promoter region deletion/duplication testing only), KIT, MEN1, MLH1, MSH2, MSH3, MSH6, MUTYH, NBN, NF1, NHTL1, PALB2, PDGFRA, PMS2, POLD1, POLE, PTEN, RAD50, RAD51C, RAD51D, RNF43, SDHB, SDHC, SDHD, SMAD4, SMARCA4. STK11, TP53, TSC1, TSC2, and VHL.  The following genes were evaluated for sequence changes only: SDHA and HOXB13 c.251G>A variant only.  The test report has been scanned into EPIC and is located under the Molecular Pathology section of the Results Review tab.  A portion of the result report is included below for reference.     We discussed with Ms. Hipp that because current genetic testing is not perfect, it is possible there may be a gene mutation in one of these genes that current testing cannot detect, but that chance is small.  We also discussed, that there could be another gene that has not yet been discovered, or that we have not yet tested, that is responsible for the cancer diagnoses in the family. It is also possible there is a hereditary cause for the cancer in the family that Ms. Atallah did not inherit and therefore was  not identified in her testing.  Therefore, it is important to remain in touch with cancer genetics in the future so that we can continue to offer Ms. Sanjose the most up to date genetic testing.   ADDITIONAL GENETIC TESTING: We discussed with Ms. Darrah that her genetic testing was fairly extensive.  If there are genes identified to increase cancer risk that can be analyzed in the future, we would be happy to discuss and coordinate this testing at that time.    CANCER SCREENING RECOMMENDATIONS: Ms. Agro's test result is considered negative (normal).  This means that we have not identified a hereditary cause for her  Personal history of cancer at this time. Most cancers happen by chance and this negative test suggests that her cancer may fall into this category.    While reassuring, this does not definitively rule out a hereditary predisposition to cancer. It is still possible that there could be genetic mutations that are undetectable by current technology. There could be genetic mutations in genes that have not been tested or identified to increase cancer risk.  Therefore, it is recommended she continue to follow the cancer management and screening guidelines provided by her oncology and primary healthcare provider.   An individual's cancer risk and medical management are not determined by genetic test results alone. Overall cancer risk assessment incorporates additional factors, including personal medical history, family history, and any available genetic information that may result in a personalized plan for cancer  prevention and surveillance.  RECOMMENDATIONS FOR FAMILY MEMBERS:  Relatives in this family might be at some increased risk of developing cancer, over the general population risk, simply due to the family history of cancer.  We recommended female relatives in this family have a yearly mammogram beginning at age 56, or 42 years younger than the earliest onset of cancer, an annual  clinical breast exam, and perform monthly breast self-exams. Female relatives in this family should also have a gynecological exam as recommended by their primary provider. All family members should have a colonoscopy by age 36, or as directed by their physicians.  FOLLOW-UP: Lastly, we discussed with Ms. Jaskulski that cancer genetics is a rapidly advancing field and it is possible that new genetic tests will be appropriate for her and/or her family members in the future. We encouraged her to remain in contact with cancer genetics on an annual basis so we can update her personal and family histories and let her know of advances in cancer genetics that may benefit this family.   Our contact number was provided. Ms. Meiring's questions were answered to her satisfaction, and she knows she is welcome to call us at anytime with additional questions or concerns.   Faith Rogue, MS, [facility] Genetic Counselor [email or web address] Phone: [phone]

## 2019-11-29 NOTE — Telephone Encounter (Signed)
Revealed negative genetic testing.  This normal result is reassuring and indicates that it is unlikely Veronica Velazquez's cancer is due to a hereditary cause.  It is unlikely that there is an increased risk of another cancer due to a mutation in one of these genes.  However, genetic testing is not perfect, and cannot definitively rule out a hereditary cause.  It will be important for her to keep in contact with genetics to learn if any additional testing may be needed in the future.

## 2019-12-04 ENCOUNTER — Other Ambulatory Visit: Payer: BC Managed Care – PPO

## 2019-12-04 ENCOUNTER — Other Ambulatory Visit: Payer: Self-pay

## 2019-12-04 ENCOUNTER — Ambulatory Visit: Payer: BC Managed Care – PPO

## 2019-12-04 ENCOUNTER — Ambulatory Visit: Payer: BC Managed Care – PPO | Admitting: Hematology and Oncology

## 2019-12-04 ENCOUNTER — Ambulatory Visit (HOSPITAL_COMMUNITY)
Admission: RE | Admit: 2019-12-04 | Discharge: 2019-12-04 | Disposition: A | Discharge status: Home / Self Care | Payer: BC Managed Care – PPO | Source: Ambulatory Visit | Attending: Hematology and Oncology | Admitting: Hematology and Oncology

## 2019-12-04 DIAGNOSIS — I351 Nonrheumatic aortic (valve) insufficiency: Secondary | ICD-10-CM | POA: Diagnosis not present

## 2019-12-04 DIAGNOSIS — Z01818 Encounter for other preprocedural examination: Secondary | ICD-10-CM | POA: Diagnosis not present

## 2019-12-04 DIAGNOSIS — I10 Essential (primary) hypertension: Secondary | ICD-10-CM | POA: Insufficient documentation

## 2019-12-04 DIAGNOSIS — C50412 Malignant neoplasm of upper-outer quadrant of left female breast: Secondary | ICD-10-CM | POA: Diagnosis not present

## 2019-12-04 DIAGNOSIS — Z171 Estrogen receptor negative status [ER-]: Secondary | ICD-10-CM

## 2019-12-04 NOTE — Progress Notes (Signed)
  Echocardiogram 2D Echocardiogram has been performed.  Veronica Velazquez 12/04/2019, 9:49 AM

## 2019-12-05 ENCOUNTER — Ambulatory Visit
Admission: RE | Admit: 2019-12-05 | Discharge: 2019-12-05 | Disposition: A | Discharge status: Home / Self Care | Payer: BC Managed Care – PPO | Source: Ambulatory Visit | Attending: Hematology and Oncology | Admitting: Hematology and Oncology

## 2019-12-05 ENCOUNTER — Encounter: Payer: Self-pay | Admitting: *Deleted

## 2019-12-05 DIAGNOSIS — R928 Other abnormal and inconclusive findings on diagnostic imaging of breast: Secondary | ICD-10-CM | POA: Diagnosis not present

## 2019-12-05 DIAGNOSIS — C50412 Malignant neoplasm of upper-outer quadrant of left female breast: Secondary | ICD-10-CM

## 2019-12-05 DIAGNOSIS — D0512 Intraductal carcinoma in situ of left breast: Secondary | ICD-10-CM | POA: Diagnosis not present

## 2019-12-05 DIAGNOSIS — Z171 Estrogen receptor negative status [ER-]: Secondary | ICD-10-CM

## 2019-12-05 HISTORY — DX: Personal history of antineoplastic chemotherapy: Z92.21

## 2019-12-11 ENCOUNTER — Other Ambulatory Visit: Payer: Self-pay

## 2019-12-11 ENCOUNTER — Encounter: Payer: Self-pay | Admitting: *Deleted

## 2019-12-11 ENCOUNTER — Other Ambulatory Visit: Payer: BC Managed Care – PPO

## 2019-12-11 ENCOUNTER — Inpatient Hospital Stay: Payer: BC Managed Care – PPO

## 2019-12-11 ENCOUNTER — Inpatient Hospital Stay: Payer: BC Managed Care – PPO | Attending: Hematology and Oncology | Admitting: Hematology and Oncology

## 2019-12-11 DIAGNOSIS — C50412 Malignant neoplasm of upper-outer quadrant of left female breast: Secondary | ICD-10-CM

## 2019-12-11 DIAGNOSIS — Z5111 Encounter for antineoplastic chemotherapy: Secondary | ICD-10-CM | POA: Diagnosis not present

## 2019-12-11 DIAGNOSIS — Z171 Estrogen receptor negative status [ER-]: Secondary | ICD-10-CM | POA: Insufficient documentation

## 2019-12-11 DIAGNOSIS — Z79899 Other long term (current) drug therapy: Secondary | ICD-10-CM | POA: Insufficient documentation

## 2019-12-11 MED ORDER — ACETAMINOPHEN 325 MG PO TABS
ORAL_TABLET | ORAL | Status: AC
  Filled 2019-12-11: qty 2

## 2019-12-11 MED ORDER — ACETAMINOPHEN 325 MG PO TABS
650.0000 mg | ORAL_TABLET | Freq: Once | ORAL | Status: AC
  Administered 2019-12-11: 650 mg via ORAL

## 2019-12-11 MED ORDER — HEPARIN SOD (PORK) LOCK FLUSH 100 UNIT/ML IV SOLN
500.0000 [IU] | Freq: Once | INTRAVENOUS | Status: AC | PRN
  Administered 2019-12-11: 10:00:00 500 [IU]
  Filled 2019-12-11: qty 5

## 2019-12-11 MED ORDER — SODIUM CHLORIDE 0.9 % IV SOLN
Freq: Once | INTRAVENOUS | Status: AC
  Filled 2019-12-11: qty 250

## 2019-12-11 MED ORDER — SODIUM CHLORIDE 0.9% FLUSH
10.0000 mL | INTRAVENOUS | Status: DC | PRN
  Administered 2019-12-11: 10 mL
  Filled 2019-12-11: qty 10

## 2019-12-11 MED ORDER — DIPHENHYDRAMINE HCL 25 MG PO CAPS
25.0000 mg | ORAL_CAPSULE | Freq: Once | ORAL | Status: AC
  Administered 2019-12-11: 09:00:00 25 mg via ORAL

## 2019-12-11 MED ORDER — TRASTUZUMAB-DKST CHEMO 150 MG IV SOLR: 8.0000 mg/kg | Freq: Once | INTRAVENOUS | Status: DC

## 2019-12-11 MED ORDER — TRASTUZUMAB-DKST CHEMO 150 MG IV SOLR
6.0000 mg/kg | Freq: Once | INTRAVENOUS | Status: AC
  Administered 2019-12-11: 672 mg via INTRAVENOUS
  Filled 2019-12-11: qty 32

## 2019-12-11 MED ORDER — DIPHENHYDRAMINE HCL 25 MG PO CAPS
ORAL_CAPSULE | ORAL | Status: AC
  Filled 2019-12-11: qty 1

## 2019-12-11 NOTE — Patient Instructions (Signed)
[facility] Discharge Instructions for Patients Receiving Chemotherapy  Today you received the following chemotherapy agents trastuzumab.  To help prevent nausea and vomiting after your treatment, we encourage you to take your nausea medication as directed.    If you develop nausea and vomiting that is not controlled by your nausea medication, call the clinic.   BELOW ARE SYMPTOMS THAT SHOULD BE REPORTED IMMEDIATELY:  *FEVER GREATER THAN 100.5 F  *CHILLS WITH OR WITHOUT FEVER  NAUSEA AND VOMITING THAT IS NOT CONTROLLED WITH YOUR NAUSEA MEDICATION  *UNUSUAL SHORTNESS OF BREATH  *UNUSUAL BRUISING OR BLEEDING  TENDERNESS IN MOUTH AND THROAT WITH OR WITHOUT PRESENCE OF ULCERS  *URINARY PROBLEMS  *BOWEL PROBLEMS  UNUSUAL RASH Items with * indicate a potential emergency and should be followed up as soon as possible.  Feel free to call the clinic should you have any questions or concerns. The clinic phone number is [phone].  Please show the CHEMO ALERT CARD at check-in to the Emergency Department and triage nurse.   

## 2019-12-11 NOTE — Progress Notes (Signed)
Pt started maintenance (q3 week) trastuzumab on 11/20/19.  No loading dose needed today per protocol.  Kennith Center, Pharm.D., CPP 12/11/2019@9 :21 AM

## 2019-12-11 NOTE — Progress Notes (Signed)
Patient Care Team: Horald Pollen, MD as PCP - General (Internal Medicine) Mauro Kaufmann, RN as Oncology Nurse Navigator Rockwell Germany, RN as Oncology Nurse Navigator Nicholas Lose, MD as Consulting Physician (Hematology and Oncology) Eppie Gibson, MD as Attending Physician (Radiation Oncology) Alphonsa Overall, MD as Consulting Physician (General Surgery)  DIAGNOSIS:  Encounter Diagnosis  Name Primary?  . Malignant neoplasm of upper-outer quadrant of left breast in female, estrogen receptor negative ([place])     SUMMARY OF ONCOLOGIC HISTORY: Oncology History  Malignant neoplasm of upper-outer quadrant of left breast in female, estrogen receptor negative ([place])  09/30/2019 Initial Diagnosis   Patient palpated left breast mass. Mammogram showed a 2.3cm mass at the 2 o'clock position, normal-appearing axillary lymph nodes. Biopsy showed IDC, grade 3, HER-2 + (3+), ER/PR -, Ki67 40%.   10/02/2019 Cancer Staging   Staging form: Breast, AJCC 8th Edition - Clinical stage from 10/02/2019: Stage IIA (cT2, cN0, cM0, G3, ER-, PR-, HER2+) - Signed by Nicholas Lose, MD on 10/02/2019   10/16/2019 - 12/10/2019 Chemotherapy   The patient had ondansetron (ZOFRAN) injection 8 mg, 8 mg (100 % of original dose 8 mg), Intravenous,  Once, 2 of 2 cycles Dose modification: 8 mg (original dose 8 mg, Cycle 1) Administration: 8 mg (10/16/2019), 8 mg (10/24/2019), 8 mg (10/31/2019), 8 mg (11/06/2019), 8 mg (11/13/2019) PACLitaxel (TAXOL) 180 mg in sodium chloride 0.9 % 250 mL chemo infusion (</= '80mg'$ /m2), 80 mg/m2 = 180 mg, Intravenous,  Once, 2 of 2 cycles Dose modification: 65 mg/m2 (original dose 80 mg/m2, Cycle 2, Reason: Dose not tolerated) Administration: 180 mg (10/16/2019), 180 mg (10/24/2019), 150 mg (11/13/2019), 180 mg (10/31/2019), 180 mg (11/06/2019) trastuzumab-dkst (OGIVRI) 450 mg in sodium chloride 0.9 % 250 mL chemo infusion, 462 mg, Intravenous,  Once, 2 of 2 cycles Dose modification: 6 mg/kg  (original dose 2 mg/kg, Cycle 2, Reason: Provider Judgment) Administration: 450 mg (10/16/2019), 231 mg (10/24/2019), 231 mg (11/13/2019), 231 mg (10/31/2019), 231 mg (11/06/2019), 693 mg (11/20/2019)  for chemotherapy treatment.     Genetic Testing   Negative genetic testing. No pathogenic variants identified on the [facility] Common Hereditary Cancers Panel. The report date is 11/26/2019.  The Common Hereditary Cancers Panel offered by [facility] includes sequencing and/or deletion duplication testing of the following 48 genes: APC, ATM, AXIN2, BARD1, BMPR1A, BRCA1, BRCA2, BRIP1, CDH1, CDKN2A (p14ARF), CDKN2A (p16INK4a), CKD4, CHEK2, CTNNA1, DICER1, EPCAM (Deletion/duplication testing only), GREM1 (promoter region deletion/duplication testing only), KIT, MEN1, MLH1, MSH2, MSH3, MSH6, MUTYH, NBN, NF1, NHTL1, PALB2, PDGFRA, PMS2, POLD1, POLE, PTEN, RAD50, RAD51C, RAD51D, RNF43, SDHB, SDHC, SDHD, SMAD4, SMARCA4. STK11, TP53, TSC1, TSC2, and VHL.  The following genes were evaluated for sequence changes only: SDHA and HOXB13 c.251G>A variant only.   12/11/2019 -  Chemotherapy   The patient had trastuzumab-dkst (OGIVRI) 903 mg in sodium chloride 0.9 % 250 mL chemo infusion, 8 mg/kg = 903 mg, Intravenous,  Once, 1 of 12 cycles  for chemotherapy treatment.      CHIEF COMPLIANT: Follow-up on Herceptin treatment  INTERVAL HISTORY: Veronica Velazquez is a 54 year old with above-mentioned history of left breast cancer who received 5 cycles of Taxol Herceptin but could not tolerate it any further and therefore the chemo was discontinued.  She is currently receiving Herceptin every 3 weeks.  She underwent a post neoadjuvant mammogram and ultrasound which showed that there was a shrinkage in the size of the tumor.  She has appointment to see surgery to discuss surgical options.  The neuropathy symptoms in her hands and feet are slightly better.  She continues to have intermittent headaches.  She lost most of the  hair.   ALLERGIES:  has No Known Allergies.  MEDICATIONS:  Current Outpatient Medications  Medication Sig Dispense Refill  . albuterol (PROVENTIL HFA;VENTOLIN HFA) 108 (90 Base) MCG/ACT inhaler Inhale 2 puffs into the lungs every 3 (three) hours as needed for wheezing (cough, shortness of breath or wheezing.). 1 Inhaler 3  . atenolol-chlorthalidone (TENORETIC) 100-25 MG tablet Take 0.5 tablets by mouth daily. 45 tablet 0  . guaiFENesin-codeine (ROBITUSSIN AC) 100-10 MG/5ML syrup Take 5-10 mLs by mouth 4 (four) times daily as needed for cough. 180 mL 0  . ibuprofen (ADVIL) 800 MG tablet Take 1 tablet (800 mg total) by mouth every 8 (eight) hours as needed. 30 tablet 0  . levothyroxine (SYNTHROID) 100 MCG tablet Take 1 tablet (100 mcg total) by mouth daily. 30 tablet 1  . lisinopril (ZESTRIL) 20 MG tablet Take 1 tablet (20 mg total) by mouth daily. 90 tablet 0  . methocarbamol (ROBAXIN) 500 MG tablet Take 1 tablet (500 mg total) by mouth 4 (four) times daily. 30 tablet 0  . simvastatin (ZOCOR) 20 MG tablet TAKE 1 TABLET (20 MG TOTAL) BY MOUTH DAILY. NEEDS OFFICE VISIT FOR FURTHER REFILLS 90 tablet 0  . traMADol (ULTRAM) 50 MG tablet Take 1 tablet (50 mg total) by mouth every 6 (six) hours as needed for moderate pain or severe pain. 10 tablet 0   No current facility-administered medications for this visit.   Facility-Administered Medications Ordered in Other Visits  Medication Dose Route Frequency Provider Last Rate Last Admin  . heparin lock flush 100 unit/mL  500 Units Intracatheter Once PRN Nicholas Lose, MD      . sodium chloride flush (NS) 0.9 % injection 10 mL  10 mL Intracatheter PRN Nicholas Lose, MD      . trastuzumab-dkst (OGIVRI) 672 mg in sodium chloride 0.9 % 250 mL chemo infusion  6 mg/kg (Treatment Plan Recorded) Intravenous Once Nicholas Lose, MD        PHYSICAL EXAMINATION: ECOG PERFORMANCE STATUS: 1 - Symptomatic but completely ambulatory  Vitals:   12/11/19 0843  BP:  140/89  Pulse: 100  Resp: 17  Temp: 98.9 F (37.2 C)  SpO2: 100%   Filed Weights   12/11/19 0843  Weight: 248 lb 4.8 oz (112.6 kg)    LABORATORY DATA:  I have reviewed the data as listed CMP Latest Ref Rng & Units 11/20/2019 11/13/2019 11/06/2019  Glucose 70 - 99 mg/dL 172(H) 194(H) 174(H)  BUN 6 - 20 mg/dL '15 13 16  '$ Creatinine 0.44 - 1.00 mg/dL 0.70 0.78 0.81  Sodium 135 - 145 mmol/L 135 132(L) 135  Potassium 3.5 - 5.1 mmol/L 3.5 3.5 3.5  Chloride 98 - 111 mmol/L 97(L) 97(L) 97(L)  CO2 22 - 32 mmol/L '27 27 28  '$ Calcium 8.9 - 10.3 mg/dL 9.2 9.2 9.5  Total Protein 6.5 - 8.1 g/dL 7.2 7.1 7.3  Total Bilirubin 0.3 - 1.2 mg/dL 0.4 0.5 0.5  Alkaline Phos 38 - 126 U/L 78 85 89  AST 15 - 41 U/L 13(L) 17 15  ALT 0 - 44 U/L '20 21 16    '$ Lab Results  Component Value Date   WBC 5.8 11/20/2019   HGB 11.4 (L) 11/20/2019   HCT 35.0 (L) 11/20/2019   MCV 76.6 (L) 11/20/2019   PLT 444 (H) 11/20/2019   NEUTROABS 3.4 11/20/2019  ASSESSMENT & PLAN:  Malignant neoplasm of upper-outer quadrant of left breast in female, estrogen receptor negative ([place]) 09/30/2019:Patient palpated left breast mass. Mammogram showed a 2.3cm mass at the 2 o'clock position, normal-appearing axillary lymph nodes. Biopsy showed IDC, grade 3, HER-2 + (3+), ER/PR -, Ki67 40%. T2 N0 stage IIa clinical stage  Treatment plan: 1.Neoadjuvant chemotherapy with Taxol-Herceptin weekly X 5 (neuropathy caused discontinuation of Taxol) followed by Herceptin maintenance for 1 year versus Kadcyla maintenance depending on the final response 2.Breast conserving surgery with sentinel lymph node biopsy 3.Adjuvant radiation ----------------------------------------------------------------------------------------------------------------------------------------------------- 12/05/2019: Ultrasound left breast: Positive response to neoadjuvant chemotherapy.  2.2 x 0.9 x 1.9 cm.  Vascularity of the mass is decreased.  Radiology  review:  could get 5 rounds of Taxol. Herceptin maintenance will be continued. Tumor board discussion this morning.  Patient will be seeing surgery to discuss surgical options.  Chemo-induced peripheral neuropathy: Symptoms are improving.  Return to clinic in 3 weeks for Herceptin maintenance therapy.  No orders of the defined types were placed in this encounter.  The patient has a good understanding of the overall plan. she agrees with it. she will call with any problems that may develop before the next visit here. Total time spent: 30 mins including face to face time and time spent for planning, charting and co-ordination of care   Harriette Ohara, MD 12/11/19

## 2019-12-11 NOTE — Assessment & Plan Note (Signed)
09/30/2019:Patient palpated left breast mass. Mammogram showed a 2.3cm mass at the 2 o'clock position, normal-appearing axillary lymph nodes. Biopsy showed IDC, grade 3, HER-2 + (3+), ER/PR -, Ki67 40%. T2 N0 stage IIa clinical stage  Treatment plan: 1.Neoadjuvant chemotherapy with Taxol-Herceptin weekly X 5 (neuropathy caused discontinuation of Taxol) followed by Herceptin maintenance for 1 year versus Kadcyla maintenance depending on the final response 2.Breast conserving surgery with sentinel lymph node biopsy 3.Adjuvant radiation ----------------------------------------------------------------------------------------------------------------------------------------------------- 12/05/2019: Ultrasound left breast: Positive response to neoadjuvant chemotherapy.  2.2 x 0.9 x 1.9 cm.  Vascularity of the mass is decreased.  Radiology review:  could get 5 rounds of Taxol.

## 2019-12-18 ENCOUNTER — Ambulatory Visit: Payer: BC Managed Care – PPO

## 2019-12-18 ENCOUNTER — Other Ambulatory Visit: Payer: BC Managed Care – PPO

## 2019-12-18 ENCOUNTER — Ambulatory Visit: Payer: BC Managed Care – PPO | Admitting: Adult Health

## 2019-12-25 ENCOUNTER — Other Ambulatory Visit: Payer: BC Managed Care – PPO

## 2019-12-25 ENCOUNTER — Ambulatory Visit: Payer: BC Managed Care – PPO

## 2019-12-25 ENCOUNTER — Ambulatory Visit: Payer: BC Managed Care – PPO | Admitting: Hematology and Oncology

## 2019-12-26 ENCOUNTER — Other Ambulatory Visit (HOSPITAL_COMMUNITY): Payer: Self-pay | Admitting: Surgery

## 2019-12-26 DIAGNOSIS — Z171 Estrogen receptor negative status [ER-]: Secondary | ICD-10-CM

## 2019-12-26 DIAGNOSIS — C50912 Malignant neoplasm of unspecified site of left female breast: Secondary | ICD-10-CM | POA: Diagnosis not present

## 2019-12-26 DIAGNOSIS — C50212 Malignant neoplasm of upper-inner quadrant of left female breast: Secondary | ICD-10-CM

## 2019-12-26 NOTE — Progress Notes (Signed)
Pharmacist Chemotherapy Monitoring - Follow Up Assessment    I verify that I have reviewed each item in the below checklist:  . Regimen for the patient is scheduled for the appropriate day and plan matches scheduled date. Marland Kitchen Appropriate non-routine labs are ordered dependent on drug ordered. . If applicable, additional medications reviewed and ordered per protocol based on lifetime cumulative doses and/or treatment regimen.   Plan for follow-up and/or issues identified: No . I-vent associated with next due treatment: No  Britt Boozer 12/26/2019 2:29 PM

## 2019-12-30 ENCOUNTER — Encounter: Payer: Self-pay | Admitting: *Deleted

## 2019-12-30 ENCOUNTER — Other Ambulatory Visit: Payer: Self-pay

## 2019-12-30 ENCOUNTER — Other Ambulatory Visit (HOSPITAL_COMMUNITY): Payer: Self-pay | Admitting: Surgery

## 2019-12-30 ENCOUNTER — Encounter (HOSPITAL_BASED_OUTPATIENT_CLINIC_OR_DEPARTMENT_OTHER): Payer: Self-pay | Admitting: Surgery

## 2019-12-30 DIAGNOSIS — C50212 Malignant neoplasm of upper-inner quadrant of left female breast: Secondary | ICD-10-CM

## 2019-12-31 ENCOUNTER — Encounter: Payer: Self-pay | Admitting: *Deleted

## 2019-12-31 NOTE — Progress Notes (Signed)
Patient Care Team: Horald Pollen, MD as PCP - General (Internal Medicine) Mauro Kaufmann, RN as Oncology Nurse Navigator Rockwell Germany, RN as Oncology Nurse Navigator Nicholas Lose, MD as Consulting Physician (Hematology and Oncology) Eppie Gibson, MD as Attending Physician (Radiation Oncology) Alphonsa Overall, MD as Consulting Physician (General Surgery)  DIAGNOSIS:    ICD-10-CM   1. Malignant neoplasm of upper-outer quadrant of left breast in female, estrogen receptor negative ([place])  C50.412    Z17.1     SUMMARY OF ONCOLOGIC HISTORY: Oncology History  Malignant neoplasm of upper-outer quadrant of left breast in female, estrogen receptor negative ([place])  09/30/2019 Initial Diagnosis   Patient palpated left breast mass. Mammogram showed a 2.3cm mass at the 2 o'clock position, normal-appearing axillary lymph nodes. Biopsy showed IDC, grade 3, HER-2 + (3+), ER/PR -, Ki67 40%.   10/02/2019 Cancer Staging   Staging form: Breast, AJCC 8th Edition - Clinical stage from 10/02/2019: Stage IIA (cT2, cN0, cM0, G3, ER-, PR-, HER2+) - Signed by Nicholas Lose, MD on 10/02/2019   10/16/2019 - 12/10/2019 Chemotherapy   The patient had ondansetron (ZOFRAN) injection 8 mg, 8 mg (100 % of original dose 8 mg), Intravenous,  Once, 2 of 2 cycles Dose modification: 8 mg (original dose 8 mg, Cycle 1) Administration: 8 mg (10/16/2019), 8 mg (10/24/2019), 8 mg (10/31/2019), 8 mg (11/06/2019), 8 mg (11/13/2019) PACLitaxel (TAXOL) 180 mg in sodium chloride 0.9 % 250 mL chemo infusion (</= 42m/m2), 80 mg/m2 = 180 mg, Intravenous,  Once, 2 of 2 cycles Dose modification: 65 mg/m2 (original dose 80 mg/m2, Cycle 2, Reason: Dose not tolerated) Administration: 180 mg (10/16/2019), 180 mg (10/24/2019), 150 mg (11/13/2019), 180 mg (10/31/2019), 180 mg (11/06/2019) trastuzumab-dkst (OGIVRI) 450 mg in sodium chloride 0.9 % 250 mL chemo infusion, 462 mg, Intravenous,  Once, 2 of 2 cycles Dose modification: 6 mg/kg (original  dose 2 mg/kg, Cycle 2, Reason: Provider Judgment) Administration: 450 mg (10/16/2019), 231 mg (10/24/2019), 231 mg (11/13/2019), 231 mg (10/31/2019), 231 mg (11/06/2019), 693 mg (11/20/2019)  for chemotherapy treatment.     Genetic Testing   Negative genetic testing. No pathogenic variants identified on the [facility] Common Hereditary Cancers Panel. The report date is 11/26/2019.  The Common Hereditary Cancers Panel offered by [facility] includes sequencing and/or deletion duplication testing of the following 48 genes: APC, ATM, AXIN2, BARD1, BMPR1A, BRCA1, BRCA2, BRIP1, CDH1, CDKN2A (p14ARF), CDKN2A (p16INK4a), CKD4, CHEK2, CTNNA1, DICER1, EPCAM (Deletion/duplication testing only), GREM1 (promoter region deletion/duplication testing only), KIT, MEN1, MLH1, MSH2, MSH3, MSH6, MUTYH, NBN, NF1, NHTL1, PALB2, PDGFRA, PMS2, POLD1, POLE, PTEN, RAD50, RAD51C, RAD51D, RNF43, SDHB, SDHC, SDHD, SMAD4, SMARCA4. STK11, TP53, TSC1, TSC2, and VHL.  The following genes were evaluated for sequence changes only: SDHA and HOXB13 c.251G>A variant only.   12/11/2019 -  Chemotherapy   The patient had trastuzumab-dkst (OGIVRI) 672 mg in sodium chloride 0.9 % 250 mL chemo infusion, 6 mg/kg = 903 mg, Intravenous,  Once, 1 of 12 cycles Administration: 672 mg (12/11/2019)  for chemotherapy treatment.      CHIEF COMPLIANT: Follow-up on Herceptin treatment  INTERVAL HISTORY: Veronica Velazquez a 54y.o. with above-mentioned history of left breast cancer who received 5 cycles of neoadjuvant chemotherapy with Taxol Herceptin, which was discontinued due to tolerance issues. She is currently receiving Herceptin every 3 weeks. She presents to the clinic today for treatment.   ALLERGIES:  has No Known Allergies.  MEDICATIONS:  Current Outpatient Medications  Medication Sig Dispense Refill  albuterol (PROVENTIL HFA;VENTOLIN HFA) 108 (90 Base) MCG/ACT inhaler Inhale 2 puffs into the lungs every 3 (three) hours as needed for wheezing  (cough, shortness of breath or wheezing.). 1 Inhaler 3   atenolol-chlorthalidone (TENORETIC) 100-25 MG tablet Take 0.5 tablets by mouth daily. 45 tablet 0   ibuprofen (ADVIL) 800 MG tablet Take 1 tablet (800 mg total) by mouth every 8 (eight) hours as needed. 30 tablet 0   levothyroxine (SYNTHROID) 100 MCG tablet Take 1 tablet (100 mcg total) by mouth daily. 30 tablet 1   lisinopril (ZESTRIL) 20 MG tablet Take 1 tablet (20 mg total) by mouth daily. 90 tablet 0   methocarbamol (ROBAXIN) 500 MG tablet Take 1 tablet (500 mg total) by mouth 4 (four) times daily. 30 tablet 0   simvastatin (ZOCOR) 20 MG tablet TAKE 1 TABLET (20 MG TOTAL) BY MOUTH DAILY. NEEDS OFFICE VISIT FOR FURTHER REFILLS 90 tablet 0   traMADol (ULTRAM) 50 MG tablet Take 1 tablet (50 mg total) by mouth every 6 (six) hours as needed for moderate pain or severe pain. 10 tablet 0   No current facility-administered medications for this visit.    PHYSICAL EXAMINATION: ECOG PERFORMANCE STATUS: 1 - Symptomatic but completely ambulatory  Vitals:   01/01/20 0928  BP: (!) 143/97  Pulse: 69  Resp: 18  Temp: 98.3 F (36.8 C)  SpO2: 100%   Filed Weights   01/01/20 0928  Weight: 252 lb 14.4 oz (114.7 kg)    LABORATORY DATA:  I have reviewed the data as listed CMP Latest Ref Rng & Units 11/20/2019 11/13/2019 11/06/2019  Glucose 70 - 99 mg/dL 172(H) 194(H) 174(H)  BUN 6 - 20 mg/dL _0 Creatinine 0.44 - 1.00 mg/dL 0.70 0.78 0.81  Sodium 135 - 145 mmol/L 135 132(L) 135  Potassium 3.5 - 5.1 mmol/L 3.5 3.5 3.5  Chloride 98 - 111 mmol/L 97(L) 97(L) 97(L)  CO2 22 - 32 mmol/L _1 Calcium 8.9 - 10.3 mg/dL 9.2 9.2 9.5  Total Protein 6.5 - 8.1 g/dL 7.2 7.1 7.3  Total Bilirubin 0.3 - 1.2 mg/dL 0.4 0.5 0.5  Alkaline Phos 38 - 126 U/L 78 85 89  AST 15 - 41 U/L 13(L) 17 15  ALT 0 - 44 U/L _2 Lab Results  Component Value Date   WBC 5.8 11/20/2019   HGB 11.4 (L) 11/20/2019   HCT 35.0 (L) 11/20/2019   MCV 76.6  (L) 11/20/2019   PLT 444 (H) 11/20/2019   NEUTROABS 3.4 11/20/2019    ASSESSMENT & PLAN:  Malignant neoplasm of upper-outer quadrant of left breast in female, estrogen receptor negative ([place]) 09/30/2019:Patient palpated left breast mass. Mammogram showed a 2.3cm mass at the 2 o'clock position, normal-appearing axillary lymph nodes. Biopsy showed IDC, grade 3, HER-2 + (3+), ER/PR -, Ki67 40%. T2 N0 stage IIa clinical stage  Treatment plan: 1.Neoadjuvant chemotherapy with Taxol-Herceptin weekly X 5 (neuropathy caused discontinuation of Taxol) followed by Herceptin maintenance for 1 year versus Kadcyla maintenance depending on the final response 2.Breast conserving surgery with sentinel lymph node biopsy 3.Adjuvant radiation ----------------------------------------------------------------------------------------------------------------------------------------------------- 12/05/2019: Ultrasound left breast: Positive response to neoadjuvant chemotherapy.  2.2 x 0.9 x 1.9 cm.  Vascularity of the mass is decreased.  Could only get 5 rounds of Taxol. Surgery has been planned for 01/07/2020. Herceptin maintenance will be continued.  Chemo-induced peripheral neuropathy: Stable  Return to clinic in 3 weeks for Herceptin maintenance therapy  I will see her  back on April 14 for a follow-up to review the results of the pathology.  No orders of the defined types were placed in this encounter.  The patient has a good understanding of the overall plan. she agrees with it. she will call with any problems that may develop before the next visit here.  Total time spent: 30 mins including face to face time and time spent for planning, charting and coordination of care  Nicholas Lose, MD 01/01/2020  I, Cloyde Reams Dorshimer, am acting as scribe for Dr. Nicholas Lose.  I have reviewed the above documentation for accuracy and completeness, and I agree with the above.

## 2020-01-01 ENCOUNTER — Inpatient Hospital Stay: Payer: BC Managed Care – PPO

## 2020-01-01 ENCOUNTER — Other Ambulatory Visit: Payer: Self-pay

## 2020-01-01 ENCOUNTER — Inpatient Hospital Stay (HOSPITAL_BASED_OUTPATIENT_CLINIC_OR_DEPARTMENT_OTHER): Payer: BC Managed Care – PPO | Admitting: Hematology and Oncology

## 2020-01-01 DIAGNOSIS — Z79899 Other long term (current) drug therapy: Secondary | ICD-10-CM | POA: Diagnosis not present

## 2020-01-01 DIAGNOSIS — Z5111 Encounter for antineoplastic chemotherapy: Secondary | ICD-10-CM | POA: Diagnosis not present

## 2020-01-01 DIAGNOSIS — Z171 Estrogen receptor negative status [ER-]: Secondary | ICD-10-CM

## 2020-01-01 DIAGNOSIS — C50412 Malignant neoplasm of upper-outer quadrant of left female breast: Secondary | ICD-10-CM

## 2020-01-01 MED ORDER — HEPARIN SOD (PORK) LOCK FLUSH 100 UNIT/ML IV SOLN
500.0000 [IU] | Freq: Once | INTRAVENOUS | Status: AC | PRN
  Administered 2020-01-01: 500 [IU]
  Filled 2020-01-01: qty 5

## 2020-01-01 MED ORDER — TRASTUZUMAB-DKST CHEMO 150 MG IV SOLR
6.0000 mg/kg | Freq: Once | INTRAVENOUS | Status: AC
  Administered 2020-01-01: 672 mg via INTRAVENOUS
  Filled 2020-01-01: qty 32

## 2020-01-01 MED ORDER — DIPHENHYDRAMINE HCL 25 MG PO CAPS
25.0000 mg | ORAL_CAPSULE | Freq: Once | ORAL | Status: AC
  Administered 2020-01-01: 25 mg via ORAL

## 2020-01-01 MED ORDER — SODIUM CHLORIDE 0.9 % IV SOLN
Freq: Once | INTRAVENOUS | Status: AC
  Filled 2020-01-01: qty 250

## 2020-01-01 MED ORDER — DIPHENHYDRAMINE HCL 25 MG PO CAPS
ORAL_CAPSULE | ORAL | Status: AC
  Filled 2020-01-01: qty 1

## 2020-01-01 MED ORDER — ACETAMINOPHEN 325 MG PO TABS
ORAL_TABLET | ORAL | Status: AC
  Filled 2020-01-01: qty 2

## 2020-01-01 MED ORDER — ACETAMINOPHEN 325 MG PO TABS
650.0000 mg | ORAL_TABLET | Freq: Once | ORAL | Status: AC
  Administered 2020-01-01: 650 mg via ORAL

## 2020-01-01 MED ORDER — SODIUM CHLORIDE 0.9% FLUSH
10.0000 mL | INTRAVENOUS | Status: DC | PRN
  Administered 2020-01-01: 10 mL
  Filled 2020-01-01: qty 10

## 2020-01-01 NOTE — Patient Instructions (Signed)
[facility] °Discharge Instructions for Patients Receiving Chemotherapy ° °Today you received the following chemotherapy agents Trastuzumab ° °To help prevent nausea and vomiting after your treatment, we encourage you to take your nausea medication as directed. °  °If you develop nausea and vomiting that is not controlled by your nausea medication, call the clinic.  ° °BELOW ARE SYMPTOMS THAT SHOULD BE REPORTED IMMEDIATELY: °· *FEVER GREATER THAN 100.5 F °· *CHILLS WITH OR WITHOUT FEVER °· NAUSEA AND VOMITING THAT IS NOT CONTROLLED WITH YOUR NAUSEA MEDICATION °· *UNUSUAL SHORTNESS OF BREATH °· *UNUSUAL BRUISING OR BLEEDING °· TENDERNESS IN MOUTH AND THROAT WITH OR WITHOUT PRESENCE OF ULCERS °· *URINARY PROBLEMS °· *BOWEL PROBLEMS °· UNUSUAL RASH °Items with * indicate a potential emergency and should be followed up as soon as possible. ° °Feel free to call the clinic should you have any questions or concerns. The clinic phone number is [phone]. ° °Please show the CHEMO ALERT CARD at check-in to the Emergency Department and triage nurse. ° ° °

## 2020-01-01 NOTE — Assessment & Plan Note (Signed)
09/30/2019:Patient palpated left breast mass. Mammogram showed a 2.3cm mass at the 2 o'clock position, normal-appearing axillary lymph nodes. Biopsy showed IDC, grade 3, HER-2 + (3+), ER/PR -, Ki67 40%. T2 N0 stage IIa clinical stage  Treatment plan: 1.Neoadjuvant chemotherapy with Taxol-Herceptin weekly X 5 (neuropathy caused discontinuation of Taxol) followed by Herceptin maintenance for 1 year versus Kadcyla maintenance depending on the final response 2.Breast conserving surgery with sentinel lymph node biopsy 3.Adjuvant radiation ----------------------------------------------------------------------------------------------------------------------------------------------------- 12/05/2019: Ultrasound left breast: Positive response to neoadjuvant chemotherapy.  2.2 x 0.9 x 1.9 cm.  Vascularity of the mass is decreased.  Could only get 5 rounds of Taxol. Surgery has been planned for 01/07/2020. Herceptin maintenance will be continued.  Chemo-induced peripheral neuropathy: Stable  Return to clinic in 3 weeks for Herceptin maintenance therapy and I will see her every 6 weeks for follow-up.. 

## 2020-01-03 ENCOUNTER — Encounter (HOSPITAL_BASED_OUTPATIENT_CLINIC_OR_DEPARTMENT_OTHER)
Admission: RE | Admit: 2020-01-03 | Discharge: 2020-01-03 | Disposition: A | Discharge status: Home / Self Care | Payer: BC Managed Care – PPO | Source: Ambulatory Visit | Attending: Surgery | Admitting: Surgery

## 2020-01-03 ENCOUNTER — Other Ambulatory Visit (HOSPITAL_COMMUNITY)
Admission: RE | Admit: 2020-01-03 | Discharge: 2020-01-03 | Disposition: A | Discharge status: Home / Self Care | Payer: BC Managed Care – PPO | Source: Ambulatory Visit | Attending: Surgery | Admitting: Surgery

## 2020-01-03 DIAGNOSIS — Z20822 Contact with and (suspected) exposure to covid-19: Secondary | ICD-10-CM | POA: Diagnosis not present

## 2020-01-03 DIAGNOSIS — Z01812 Encounter for preprocedural laboratory examination: Secondary | ICD-10-CM | POA: Insufficient documentation

## 2020-01-03 LAB — SARS CORONAVIRUS 2 (TAT 6-24 HRS): SARS Coronavirus 2: NEGATIVE

## 2020-01-03 LAB — BASIC METABOLIC PANEL
Anion gap: 13 (ref 5–15)
BUN: 17 mg/dL (ref 6–20)
CO2: 23 mmol/L (ref 22–32)
Calcium: 9.6 mg/dL (ref 8.9–10.3)
Chloride: 99 mmol/L (ref 98–111)
Creatinine, Ser: 0.74 mg/dL (ref 0.44–1.00)
GFR calc Af Amer: >60 mL/min (ref >60)
GFR calc non Af Amer: >60 mL/min (ref >60)
Glucose, Bld: 198 mg/dL — ABNORMAL HIGH (ref 70–99)
Potassium: 3.9 mmol/L (ref 3.5–5.1)
Sodium: 135 mmol/L (ref 135–145)

## 2020-01-03 NOTE — Anesthesia Preprocedure Evaluation (Addendum)
Anesthesia Evaluation  Patient identified by MRN, date of birth, ID band Patient awake    Reviewed: Allergy & Precautions, NPO status , Patient's Chart, lab work & pertinent test results, reviewed documented beta blocker date and time   Airway Mallampati: III  TM Distance: >3 FB Neck ROM: Full    Dental  (+) Edentulous Upper, Edentulous Lower   Pulmonary asthma ,  Snores, no sleep study in past   Pulmonary exam normal breath sounds clear to auscultation       Cardiovascular hypertension, Pt. on home beta blockers and Pt. on medications Normal cardiovascular exam Rhythm:Regular Rate:Normal     Neuro/Psych  Neuromuscular disease negative psych ROS   GI/Hepatic negative GI ROS, Neg liver ROS,   Endo/Other  Hypothyroidism Morbid obesity  Renal/GU negative Renal ROS     Musculoskeletal negative musculoskeletal ROS (+)   Abdominal (+) + obese,   Peds  Hematology negative hematology ROS (+)   Anesthesia Other Findings   Reproductive/Obstetrics negative OB ROS                                                             Anesthesia Evaluation  Patient identified by MRN, date of birth, ID band Patient awake    Reviewed: Allergy & Precautions, NPO status , Patient's Chart, lab work & pertinent test results, reviewed documented beta blocker date and time   History of Anesthesia Complications Negative for: history of anesthetic complications  Airway Mallampati: II  TM Distance: >3 FB Neck ROM: Full    Dental  (+) Edentulous Lower, Edentulous Upper   Pulmonary asthma ,    Pulmonary exam normal        Cardiovascular hypertension, Pt. on medications and Pt. on home beta blockers Normal cardiovascular exam     Neuro/Psych Depression negative neurological ROS     GI/Hepatic negative GI ROS, Neg liver ROS,   Endo/Other  Hypothyroidism Morbid obesity  Renal/GU negative Renal  ROS  negative genitourinary   Musculoskeletal negative musculoskeletal ROS (+)   Abdominal   Peds  Hematology negative hematology ROS (+)   Anesthesia Other Findings Left breast ca  Reproductive/Obstetrics negative OB ROS                           Anesthesia Physical Anesthesia Plan  ASA: III  Anesthesia Plan: General   Post-op Pain Management:    Induction: Intravenous  PONV Risk Score and Plan: 3 and Treatment may vary due to age or medical condition, Ondansetron, Dexamethasone and Midazolam  Airway Management Planned: LMA  Additional Equipment: None  Intra-op Plan:   Post-operative Plan: Extubation in OR  Informed Consent: I have reviewed the patients History and Physical, chart, labs and discussed the procedure including the risks, benefits and alternatives for the proposed anesthesia with the patient or authorized representative who has indicated his/her understanding and acceptance.     Dental advisory given  Plan Discussed with: CRNA  Anesthesia Plan Comments:        Anesthesia Quick Evaluation  Anesthesia Physical Anesthesia Plan  ASA: III  Anesthesia Plan: General   Post-op Pain Management: GA combined w/ Regional for post-op pain   Induction: Intravenous  PONV Risk Score and Plan:   Airway Management Planned: LMA  Additional Equipment: None  Intra-op Plan:   Post-operative Plan: Extubation in OR  Informed Consent: I have reviewed the patients History and Physical, chart, labs and discussed the procedure including the risks, benefits and alternatives for the proposed anesthesia with the patient or authorized representative who has indicated his/her understanding and acceptance.     Dental advisory given  Plan Discussed with: CRNA  Anesthesia Plan Comments:        Anesthesia Quick Evaluation

## 2020-01-03 NOTE — Progress Notes (Signed)

## 2020-01-06 ENCOUNTER — Ambulatory Visit
Admission: RE | Admit: 2020-01-06 | Discharge: 2020-01-06 | Disposition: A | Discharge status: Home / Self Care | Payer: BC Managed Care – PPO | Source: Ambulatory Visit | Attending: Surgery | Admitting: Surgery

## 2020-01-06 ENCOUNTER — Other Ambulatory Visit: Payer: Self-pay

## 2020-01-06 DIAGNOSIS — C50212 Malignant neoplasm of upper-inner quadrant of left female breast: Secondary | ICD-10-CM

## 2020-01-06 DIAGNOSIS — C50412 Malignant neoplasm of upper-outer quadrant of left female breast: Secondary | ICD-10-CM | POA: Diagnosis not present

## 2020-01-06 DIAGNOSIS — Z171 Estrogen receptor negative status [ER-]: Secondary | ICD-10-CM

## 2020-01-06 NOTE — H&P (Signed)
Veronica Velazquez  Location: [facility] Patient #: [id number] DOB: 04/20/1966 Married / Language: English / Race: Refused to Report/Unreported Female  History of Present Illness   The patient is a 54 year old female who presents with a complaint of breast cancer.  The PCP is Dr. Mitchel Honour [facility])  The patient was referred by Dr. Maylon Cos  The patient is at the Breast [facility] - Oncology is Drs. Lindi Adie and Isidore Moos  She does not speak much [place]. Her daughter, Veronica Velazquez, is there with her and acted as Optometrist. Veronica Velazquez's phone - [phone]. The history and discussion were primarily with Veronica Velazquez.  [The Covid-19 virus has disrupted normal medical care in [place] and across the nation. We have sometimes had to alter normal surgical/medical care to limit this epidemic and we have explained these changes to the patient.]  She has completed neoadjuvant chemotx by Dr. Lindi Adie and has had a response of the primary tumor. She developed a peripheral neuropathy, which limited the taxol. She is here to discuss her surgical treatment.  On her original mammograms: [facility] - 09/16/2019 - 2.3 x 1.9 x 2.3 cm mass with internal calcifications at the 2 o'clock position of the left breast, axilla negative. Repeat imaging on 12/05/2019: Ultrasound left breast: Positive response to neoadjuvant chemotherapy. 2.2 x 0.9 x 1.9 cm. Vascularity of the mass is decreased.  I discussed the options for breast cancer treatment with the patient. I discussed the surgical options of lumpectomy vs. mastectomy. I think that Ms. Carren Rang is a good candidate for lumpectomy. I discussed the options of lymph node biopsy. The treatment plan depends on the pathologic staging of the tumor and the patient's personal wishes. The risks of surgery include, but are not limited to, bleeding, infection, the need for further surgery, and nerve injury. The patient  has been given literature on the treatment of breast cancer.  Plan: 1. Completed neoadjuvant chemotx, 2. Left lumpectomy (seed localization) and left axillary SLNBx, 5. Radiation therapy  Past Medical History: 1. Left breast cancer Biopsy: left breast biopsy at 2 o'clock - 09/24/2019 ([id number]) - IDC, grade 3, ER - 0%, PR - 0%, Ki67 - 40%, Her2Neu - POSITIVE 2. Asthma 3. Morbid obesity 4. History of depression 5. HTN 6. On thyroid med 7. She saw Dr. Einar Gip several years ago after her brother had an MI She had no significant card issues. 8. Right IJ power port - 10/15/2019 - Lucia Gaskins  Social History: Married. Her husband works at She has 3 children: Her daughter, Veronica Velazquez, 74 yo, is there with her and acted as Optometrist. Veronica Velazquez's phone - [phone]. 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 Veronica Velazquez - 55 yo in [place] - 54 yo in [place] - she lives at home with her parents. she does not work  Allergies (Tanisha A. Owens Shark, [place]; 12/26/2019 8:56 AM) No Known Allergies [09/30/2019]: Allergies Reconciled   Medication History (Tanisha A. Owens Shark, [place]; 12/26/2019 8:56 AM) Proventil HFA (108 (90 Base)MCG/ACT Aerosol Soln, Inhalation) Active. Tenoretic 100 (100-25MG Tablet, Oral) Active. Ibuprofen (800MG Tablet, Oral) Active. Synthroid (100MCG Tablet, Oral) Active. Robaxin (500MG Tablet, Oral) Active. Zestril (20MG Tablet, Oral) Active. Zocor (20MG Tablet, Oral) Active. Medications Reconciled  Vitals (Tanisha A. Brown RMA; 12/26/2019 8:56 AM) 12/26/2019 8:56 AM Weight: 252 lb Height: 62in Body Surface Area: 2.11 m Body Mass Index: 46.09 kg/m  Temp.: 97.51F  Pulse: 82 (Regular)  BP: 136/82 (Sitting, Left Arm, Standard)  Physical Exam  General: Obese  Older Pakastani F who is alert and generally healthy appearing. She did say some words of English. She wore a mask. HEENT: Normal. Pupils  equal.  Neck: Supple. No mass. No thyroid mass.  Lymph Nodes: No supraclavicular or cervical nodes.  Lungs: Clear to auscultation and symmetric breath sounds. Heart: RRR. No murmur or rub.  Breast: Right - large and pendulous - no mass Left - large and pendulous. The mass I could feel at 2 o'clock in her left breast is essentially gone - so she has had a good response to neoadjuvant chemotx.  Extremities: Good strength and ROM in upper and lower extremities.   Assessment & Plan  1.  MALIGNANT NEOPLASM OF LEFT BREAST, STAGE 1, ESTROGEN RECEPTOR NEGATIVE (C50.912)  Story: Left breast biopsy at 2 o'clock - 09/24/2019 ([id number]) - IDC, grade 3, ER - 0%, PR - 0%, Ki67 - 40%, Her2Neu - POSITIVE  Oncology - Lindi Adie and [place]   Plan:  1. For left breast lumpectomy (seed localization) and left axillary SLNBx   2. Herceptin maintenance  3. Radiation therapy  2. Asthma 3. Morbid obesity 4. History of depression 5. HTN 6. On thyroid med 7. She saw Dr. Einar Gip several years ago after her brother had an MI She had no significant card issues.  Alphonsa Overall, MD, [facility] Office phone:  [phone]

## 2020-01-07 ENCOUNTER — Encounter (HOSPITAL_BASED_OUTPATIENT_CLINIC_OR_DEPARTMENT_OTHER): Payer: Self-pay | Admitting: Surgery

## 2020-01-07 ENCOUNTER — Ambulatory Visit (HOSPITAL_BASED_OUTPATIENT_CLINIC_OR_DEPARTMENT_OTHER): Payer: BC Managed Care – PPO | Admitting: Anesthesiology

## 2020-01-07 ENCOUNTER — Ambulatory Visit (HOSPITAL_BASED_OUTPATIENT_CLINIC_OR_DEPARTMENT_OTHER)
Admission: RE | Admit: 2020-01-07 | Discharge: 2020-01-07 | Disposition: A | Discharge status: Home / Self Care | Payer: BC Managed Care – PPO | Attending: Surgery | Admitting: Surgery

## 2020-01-07 ENCOUNTER — Ambulatory Visit
Admission: RE | Admit: 2020-01-07 | Discharge: 2020-01-07 | Disposition: A | Discharge status: Home / Self Care | Payer: BC Managed Care – PPO | Source: Ambulatory Visit | Attending: Surgery | Admitting: Surgery

## 2020-01-07 ENCOUNTER — Other Ambulatory Visit: Payer: Self-pay

## 2020-01-07 ENCOUNTER — Encounter (HOSPITAL_BASED_OUTPATIENT_CLINIC_OR_DEPARTMENT_OTHER)
Admission: RE | Disposition: A | Discharge status: Home / Self Care | Payer: Self-pay | Source: Home / Self Care | Attending: Surgery

## 2020-01-07 ENCOUNTER — Ambulatory Visit (HOSPITAL_COMMUNITY)
Admission: RE | Admit: 2020-01-07 | Discharge: 2020-01-07 | Disposition: A | Discharge status: Home / Self Care | Payer: BC Managed Care – PPO | Source: Ambulatory Visit | Attending: Surgery | Admitting: Surgery

## 2020-01-07 DIAGNOSIS — Z171 Estrogen receptor negative status [ER-]: Secondary | ICD-10-CM | POA: Diagnosis not present

## 2020-01-07 DIAGNOSIS — C50212 Malignant neoplasm of upper-inner quadrant of left female breast: Secondary | ICD-10-CM

## 2020-01-07 DIAGNOSIS — Z6841 Body Mass Index (BMI) 40.0 and over, adult: Secondary | ICD-10-CM | POA: Diagnosis not present

## 2020-01-07 DIAGNOSIS — J45909 Unspecified asthma, uncomplicated: Secondary | ICD-10-CM | POA: Diagnosis not present

## 2020-01-07 DIAGNOSIS — G629 Polyneuropathy, unspecified: Secondary | ICD-10-CM | POA: Diagnosis not present

## 2020-01-07 DIAGNOSIS — Z8249 Family history of ischemic heart disease and other diseases of the circulatory system: Secondary | ICD-10-CM | POA: Insufficient documentation

## 2020-01-07 DIAGNOSIS — Z79899 Other long term (current) drug therapy: Secondary | ICD-10-CM | POA: Insufficient documentation

## 2020-01-07 DIAGNOSIS — I10 Essential (primary) hypertension: Secondary | ICD-10-CM | POA: Insufficient documentation

## 2020-01-07 DIAGNOSIS — E039 Hypothyroidism, unspecified: Secondary | ICD-10-CM | POA: Diagnosis not present

## 2020-01-07 DIAGNOSIS — G8918 Other acute postprocedural pain: Secondary | ICD-10-CM | POA: Diagnosis not present

## 2020-01-07 DIAGNOSIS — Z79890 Hormone replacement therapy (postmenopausal): Secondary | ICD-10-CM | POA: Insufficient documentation

## 2020-01-07 DIAGNOSIS — N6012 Diffuse cystic mastopathy of left breast: Secondary | ICD-10-CM | POA: Diagnosis not present

## 2020-01-07 DIAGNOSIS — C50912 Malignant neoplasm of unspecified site of left female breast: Secondary | ICD-10-CM | POA: Diagnosis not present

## 2020-01-07 DIAGNOSIS — C50412 Malignant neoplasm of upper-outer quadrant of left female breast: Secondary | ICD-10-CM | POA: Diagnosis not present

## 2020-01-07 HISTORY — PX: BREAST LUMPECTOMY: SHX2

## 2020-01-07 HISTORY — DX: Prediabetes: R73.03

## 2020-01-07 HISTORY — PX: BREAST LUMPECTOMY WITH RADIOACTIVE SEED AND SENTINEL LYMPH NODE BIOPSY: SHX6550

## 2020-01-07 SURGERY — BREAST LUMPECTOMY WITH RADIOACTIVE SEED AND SENTINEL LYMPH NODE BIOPSY
Anesthesia: General | Site: Breast | Laterality: Left

## 2020-01-07 MED ORDER — LIDOCAINE HCL (CARDIAC) PF 100 MG/5ML IV SOSY
PREFILLED_SYRINGE | INTRAVENOUS | Status: DC | PRN
  Administered 2020-01-07: 100 mg via INTRAVENOUS

## 2020-01-07 MED ORDER — FENTANYL CITRATE (PF) 100 MCG/2ML IJ SOLN
INTRAMUSCULAR | Status: DC | PRN
  Administered 2020-01-07: 25 ug via INTRAVENOUS

## 2020-01-07 MED ORDER — CHLORHEXIDINE GLUCONATE 4 % EX LIQD: 60.0000 mL | Freq: Once | CUTANEOUS | Status: DC

## 2020-01-07 MED ORDER — FENTANYL CITRATE (PF) 100 MCG/2ML IJ SOLN
INTRAMUSCULAR | Status: AC
  Filled 2020-01-07: qty 2

## 2020-01-07 MED ORDER — PROPOFOL 10 MG/ML IV BOLUS
INTRAVENOUS | Status: DC | PRN
  Administered 2020-01-07: 200 mg via INTRAVENOUS

## 2020-01-07 MED ORDER — PHENYLEPHRINE HCL-NACL 10-0.9 MG/250ML-% IV SOLN
INTRAVENOUS | Status: DC | PRN
  Administered 2020-01-07: 25 ug/min via INTRAVENOUS

## 2020-01-07 MED ORDER — OXYCODONE HCL 5 MG PO TABS
ORAL_TABLET | ORAL | Status: AC
  Filled 2020-01-07: qty 1

## 2020-01-07 MED ORDER — PROMETHAZINE HCL 25 MG/ML IJ SOLN: 6.2500 mg | INTRAMUSCULAR | Status: DC | PRN

## 2020-01-07 MED ORDER — GLYCOPYRROLATE PF 0.2 MG/ML IJ SOSY
PREFILLED_SYRINGE | INTRAMUSCULAR | Status: AC
  Filled 2020-01-07: qty 1

## 2020-01-07 MED ORDER — OXYCODONE HCL 5 MG/5ML PO SOLN: 5.0000 mg | Freq: Once | ORAL | Status: AC | PRN

## 2020-01-07 MED ORDER — GLYCOPYRROLATE 0.2 MG/ML IJ SOLN
INTRAMUSCULAR | Status: DC | PRN
  Administered 2020-01-07: .2 mg via INTRAVENOUS

## 2020-01-07 MED ORDER — EPHEDRINE SULFATE 50 MG/ML IJ SOLN
INTRAMUSCULAR | Status: DC | PRN
  Administered 2020-01-07: 10 mg via INTRAVENOUS
  Administered 2020-01-07: 5 mg via INTRAVENOUS
  Administered 2020-01-07 (×2): 20 mg via INTRAVENOUS

## 2020-01-07 MED ORDER — BUPIVACAINE HCL (PF) 0.25 % IJ SOLN
INTRAMUSCULAR | Status: DC | PRN
  Administered 2020-01-07: 10 mL

## 2020-01-07 MED ORDER — CLONIDINE HCL (ANALGESIA) 100 MCG/ML EP SOLN: EPIDURAL | Status: DC | PRN

## 2020-01-07 MED ORDER — ACETAMINOPHEN 500 MG PO TABS
ORAL_TABLET | ORAL | Status: AC
  Filled 2020-01-07: qty 2

## 2020-01-07 MED ORDER — OXYCODONE HCL 5 MG PO TABS
5.0000 mg | ORAL_TABLET | Freq: Once | ORAL | Status: AC | PRN
  Administered 2020-01-07: 5 mg via ORAL

## 2020-01-07 MED ORDER — ACETAMINOPHEN 500 MG PO TABS
1000.0000 mg | ORAL_TABLET | ORAL | Status: AC
  Administered 2020-01-07: 1000 mg via ORAL

## 2020-01-07 MED ORDER — TRAMADOL HCL 50 MG PO TABS: 50.0000 mg | ORAL_TABLET | Freq: Four times a day (QID) | ORAL | 0 refills | Status: DC | PRN

## 2020-01-07 MED ORDER — HYDROMORPHONE HCL 1 MG/ML IJ SOLN: 0.2500 mg | INTRAMUSCULAR | Status: DC | PRN

## 2020-01-07 MED ORDER — ONDANSETRON HCL 4 MG/2ML IJ SOLN
INTRAMUSCULAR | Status: DC | PRN
  Administered 2020-01-07: 4 mg via INTRAVENOUS

## 2020-01-07 MED ORDER — PHENYLEPHRINE HCL (PRESSORS) 10 MG/ML IV SOLN
INTRAVENOUS | Status: DC | PRN
  Administered 2020-01-07: 40 ug via INTRAVENOUS
  Administered 2020-01-07: 80 ug via INTRAVENOUS

## 2020-01-07 MED ORDER — SODIUM CHLORIDE (PF) 0.9 % IJ SOLN
INTRAVENOUS | Status: DC | PRN
  Administered 2020-01-07: 1 mL via INTRADERMAL

## 2020-01-07 MED ORDER — PHENYLEPHRINE 40 MCG/ML (10ML) SYRINGE FOR IV PUSH (FOR BLOOD PRESSURE SUPPORT)
PREFILLED_SYRINGE | INTRAVENOUS | Status: AC
  Filled 2020-01-07: qty 10

## 2020-01-07 MED ORDER — CEFAZOLIN SODIUM-DEXTROSE 2-4 GM/100ML-% IV SOLN
INTRAVENOUS | Status: AC
  Filled 2020-01-07: qty 100

## 2020-01-07 MED ORDER — EPHEDRINE 5 MG/ML INJ
INTRAVENOUS | Status: AC
  Filled 2020-01-07: qty 10

## 2020-01-07 MED ORDER — LACTATED RINGERS IV SOLN: INTRAVENOUS | Status: DC

## 2020-01-07 MED ORDER — DEXAMETHASONE SODIUM PHOSPHATE 10 MG/ML IJ SOLN
INTRAMUSCULAR | Status: DC | PRN
  Administered 2020-01-07: 5 mg via INTRAVENOUS

## 2020-01-07 MED ORDER — TECHNETIUM TC 99M SULFUR COLLOID FILTERED
1.0000 | Freq: Once | INTRAVENOUS | Status: AC | PRN
  Administered 2020-01-07: 1 via INTRADERMAL

## 2020-01-07 MED ORDER — MIDAZOLAM HCL 2 MG/2ML IJ SOLN
1.0000 mg | INTRAMUSCULAR | Status: DC | PRN
  Administered 2020-01-07 (×2): 1 mg via INTRAVENOUS

## 2020-01-07 MED ORDER — MEPERIDINE HCL 25 MG/ML IJ SOLN: 6.2500 mg | INTRAMUSCULAR | Status: DC | PRN

## 2020-01-07 MED ORDER — MIDAZOLAM HCL 2 MG/2ML IJ SOLN
INTRAMUSCULAR | Status: AC
  Filled 2020-01-07: qty 2

## 2020-01-07 MED ORDER — BUPIVACAINE HCL (PF) 0.5 % IJ SOLN
INTRAMUSCULAR | Status: DC | PRN
  Administered 2020-01-07: 30 mL

## 2020-01-07 MED ORDER — CEFAZOLIN SODIUM-DEXTROSE 2-4 GM/100ML-% IV SOLN
2.0000 g | INTRAVENOUS | Status: AC
  Administered 2020-01-07: 2 g via INTRAVENOUS

## 2020-01-07 MED ORDER — FENTANYL CITRATE (PF) 100 MCG/2ML IJ SOLN
50.0000 ug | INTRAMUSCULAR | Status: DC | PRN
  Administered 2020-01-07 (×2): 50 ug via INTRAVENOUS

## 2020-01-07 SURGICAL SUPPLY — 51 items
BENZOIN TINCTURE PRP APPL 2/3 (GAUZE/BANDAGES/DRESSINGS) IMPLANT
BINDER BREAST LRG (GAUZE/BANDAGES/DRESSINGS) IMPLANT
BINDER BREAST MEDIUM (GAUZE/BANDAGES/DRESSINGS) IMPLANT
BINDER BREAST XLRG (GAUZE/BANDAGES/DRESSINGS) IMPLANT
BINDER BREAST XXLRG (GAUZE/BANDAGES/DRESSINGS) ×3 IMPLANT
BLADE SURG 15 STRL LF DISP TIS (BLADE) ×1 IMPLANT
BLADE SURG 15 STRL SS (BLADE) ×2
CANISTER SUC SOCK COL 7IN (MISCELLANEOUS) IMPLANT
CANISTER SUCT 1200ML W/VALVE (MISCELLANEOUS) ×3 IMPLANT
CHLORAPREP W/TINT 26 (MISCELLANEOUS) ×3 IMPLANT
CLIP VESOCCLUDE SM WIDE 6/CT (CLIP) ×3 IMPLANT
CLOSURE WOUND 1/2 X4 (GAUZE/BANDAGES/DRESSINGS)
COVER BACK TABLE 60X90IN (DRAPES) ×3 IMPLANT
COVER MAYO STAND STRL (DRAPES) ×3 IMPLANT
COVER PROBE W GEL 5X96 (DRAPES) ×3 IMPLANT
COVER WAND RF STERILE (DRAPES) IMPLANT
DECANTER SPIKE VIAL GLASS SM (MISCELLANEOUS) IMPLANT
DERMABOND ADVANCED (GAUZE/BANDAGES/DRESSINGS) ×2
DERMABOND ADVANCED .7 DNX12 (GAUZE/BANDAGES/DRESSINGS) ×1 IMPLANT
DRAPE LAPAROSCOPIC ABDOMINAL (DRAPES) ×3 IMPLANT
DRAPE UTILITY XL STRL (DRAPES) ×3 IMPLANT
DRSG PAD ABDOMINAL 8X10 ST (GAUZE/BANDAGES/DRESSINGS) IMPLANT
ELECT COATED BLADE 2.86 ST (ELECTRODE) ×3 IMPLANT
ELECT REM PT RETURN 9FT ADLT (ELECTROSURGICAL) ×3
ELECTRODE REM PT RTRN 9FT ADLT (ELECTROSURGICAL) ×1 IMPLANT
GAUZE SPONGE 4X4 12PLY STRL (GAUZE/BANDAGES/DRESSINGS) ×3 IMPLANT
GLOVE SURG SYN 7.5  E (GLOVE) ×4
GLOVE SURG SYN 7.5 E (GLOVE) ×2 IMPLANT
GOWN STRL REUS W/ TWL LRG LVL3 (GOWN DISPOSABLE) ×1 IMPLANT
GOWN STRL REUS W/ TWL XL LVL3 (GOWN DISPOSABLE) ×1 IMPLANT
GOWN STRL REUS W/TWL LRG LVL3 (GOWN DISPOSABLE) ×2
GOWN STRL REUS W/TWL XL LVL3 (GOWN DISPOSABLE) ×2
KIT MARKER MARGIN INK (KITS) ×3 IMPLANT
NDL SAFETY ECLIPSE 18X1.5 (NEEDLE) IMPLANT
NEEDLE HYPO 18GX1.5 SHARP (NEEDLE)
NEEDLE HYPO 25X1 1.5 SAFETY (NEEDLE) ×3 IMPLANT
NS IRRIG 1000ML POUR BTL (IV SOLUTION) ×3 IMPLANT
PACK BASIN DAY SURGERY FS (CUSTOM PROCEDURE TRAY) ×3 IMPLANT
PENCIL SMOKE EVACUATOR (MISCELLANEOUS) ×3 IMPLANT
SHEET MEDIUM DRAPE 40X70 STRL (DRAPES) ×3 IMPLANT
SLEEVE SCD COMPRESS KNEE MED (MISCELLANEOUS) ×3 IMPLANT
SPONGE LAP 18X18 RF (DISPOSABLE) ×3 IMPLANT
STRIP CLOSURE SKIN 1/2X4 (GAUZE/BANDAGES/DRESSINGS) IMPLANT
SUT MNCRL AB 4-0 PS2 18 (SUTURE) ×3 IMPLANT
SUT VICRYL 3-0 CR8 SH (SUTURE) ×9 IMPLANT
SYR CONTROL 10ML LL (SYRINGE) ×3 IMPLANT
TOWEL GREEN STERILE FF (TOWEL DISPOSABLE) ×3 IMPLANT
TRAY FAXITRON CT DISP (TRAY / TRAY PROCEDURE) ×3 IMPLANT
TUBE CONNECTING 20'X1/4 (TUBING) ×1
TUBE CONNECTING 20X1/4 (TUBING) ×2 IMPLANT
YANKAUER SUCT BULB TIP NO VENT (SUCTIONS) ×3 IMPLANT

## 2020-01-07 NOTE — Transfer of Care (Signed)
Immediate Anesthesia Transfer of Care Note  Patient: Veronica Velazquez  Procedure(s) Performed: LEFT BREAST LUMPECTOMY WITH RADIOACTIVE SEED AND LEFT AXILLARY SENTINEL LYMPH NODE BIOPSY (Left Breast)  Patient Location: PACU  Anesthesia Type:GA combined with regional for post-op pain  Level of Consciousness: drowsy  Airway & Oxygen Therapy: Patient Spontanous Breathing and Patient connected to face mask oxygen  Post-op Assessment: Report given to RN and Post -op Vital signs reviewed and stable  Post vital signs: Reviewed and stable  Last Vitals:  Vitals Value Taken Time  BP 145/87 01/07/20 1215  Temp    Pulse 90 01/07/20 1217  Resp 18 01/07/20 1217  SpO2 100 % 01/07/20 1217  Vitals shown include unvalidated device data.  Last Pain:  Vitals:   01/07/20 0908  TempSrc: Temporal  PainSc: 0-No pain      Patients Stated Pain Goal: 1 ([id number])  Complications: No apparent anesthesia complications

## 2020-01-07 NOTE — Anesthesia Postprocedure Evaluation (Signed)
Anesthesia Post Note  Patient: Gaffer  Procedure(s) Performed: LEFT BREAST LUMPECTOMY WITH RADIOACTIVE SEED AND LEFT AXILLARY SENTINEL LYMPH NODE BIOPSY (Left Breast)     Patient location during evaluation: PACU Anesthesia Type: General Level of consciousness: sedated and patient cooperative Pain management: pain level controlled Vital Signs Assessment: post-procedure vital signs reviewed and stable Respiratory status: spontaneous breathing Cardiovascular status: stable Anesthetic complications: no    Last Vitals:  Vitals:   01/07/20 1249 01/07/20 1328  BP:  (!) 156/88  Pulse: 90 99  Resp: (!) 22 18  Temp:  (!) 36.2 C  SpO2: 97% 97%    Last Pain:  Vitals:   01/07/20 1328  TempSrc: Axillary  PainSc: 2                  Nolon Nations

## 2020-01-07 NOTE — Progress Notes (Signed)
Emotional support during breast injections °

## 2020-01-07 NOTE — Progress Notes (Signed)
Assisted Dr. Germeroth with left, ultrasound guided, pectoralis block. Side rails up, monitors on throughout procedure. See vital signs in flow sheet. Tolerated Procedure well. 

## 2020-01-07 NOTE — Interval H&P Note (Signed)
History and Physical Interval Note:  01/07/2020 10:06 AM  Veronica Velazquez  has presented today for surgery, with the diagnosis of LEFT BREAST CANCER.  The various methods of treatment have been discussed with the patient and family. Daughter at bedside.  After consideration of risks, benefits and other options for treatment, the patient has consented to  Procedure(s): LEFT BREAST LUMPECTOMY WITH RADIOACTIVE SEED AND LEFT AXILLARY SENTINEL LYMPH NODE BIOPSY (Left) as a surgical intervention.  The patient's history has been reviewed, patient examined, no change in status, stable for surgery.  I have reviewed the patient's chart and labs.  Questions were answered to the patient's satisfaction.     Shann Medal

## 2020-01-07 NOTE — Discharge Instructions (Signed)
Next dose of Tylenol at 3:15 PM  [facility] - DISCHARGE INSTRUCTIONS TO PATIENT  Activity:  Lifting - no more than 15 pounds                       Practice your Covid-19 protection:  Wear a mask, social distance, and wash your hands frequently  Wound Care:   Leave the incision dry for 2 days, then you may shower  Diet:  As tolerated  Follow up appointment:  Call Dr. Pollie Friar office [facility]) at [phone] for an appointment in 2 weeks.  Medications and dosages:  Resume your home medications.  You have a prescription for:  Ultram  Call Dr. Lucia Gaskins or his office  [phone]) if you have:  Temperature greater than 100.4,  Persistent nausea and vomiting,  Severe uncontrolled pain,  Redness, tenderness, or signs of infection (pain, swelling, redness, odor or green/yellow discharge around the site),  Difficulty breathing, headache or visual disturbances,  Any other questions or concerns you may have after discharge.  In an emergency, call 911 or go to an Emergency Department at a nearby hospital.    Post Anesthesia Home Care Instructions  Activity: Get plenty of rest for the remainder of the day. A responsible individual must stay with you for 24 hours following the procedure.  For the next 24 hours, DO NOT: -Drive a car -Paediatric nurse -Drink alcoholic beverages -Take any medication unless instructed by your physician -Make any legal decisions or sign important papers.  Meals: Start with liquid foods such as gelatin or soup. Progress to regular foods as tolerated. Avoid greasy, spicy, heavy foods. If nausea and/or vomiting occur, drink only clear liquids until the nausea and/or vomiting subsides. Call your physician if vomiting continues.  Special Instructions/Symptoms: Your throat may feel dry or sore from the anesthesia or the breathing tube placed in your throat during surgery. If this causes discomfort, gargle with warm salt  water. The discomfort should disappear within 24 hours.  If you had a scopolamine patch placed behind your ear for the management of post- operative nausea and/or vomiting:  1. The medication in the patch is effective for 72 hours, after which it should be removed.  Wrap patch in a tissue and discard in the trash. Wash hands thoroughly with soap and water. 2. You may remove the patch earlier than 72 hours if you experience unpleasant side effects which may include dry mouth, dizziness or visual disturbances. 3. Avoid touching the patch. Wash your hands with soap and water after contact with the patch.

## 2020-01-07 NOTE — Anesthesia Procedure Notes (Signed)
Procedure Name: LMA Insertion Date/Time: 01/07/2020 10:21 AM Performed by: Lavonia Dana, CRNA Pre-anesthesia Checklist: Patient identified, Emergency Drugs available, Suction available and Patient being monitored Patient Re-evaluated:Patient Re-evaluated prior to induction Oxygen Delivery Method: Circle system utilized Preoxygenation: Pre-oxygenation with 100% oxygen Induction Type: IV induction Ventilation: Mask ventilation without difficulty LMA: LMA inserted LMA Size: 4.0 Number of attempts: 1 Airway Equipment and Method: Bite block Placement Confirmation: positive ETCO2 Tube secured with: Tape Dental Injury: Teeth and Oropharynx as per pre-operative assessment

## 2020-01-07 NOTE — Op Note (Addendum)
01/07/2020  10:14 AM  PATIENT:  Veronica Velazquez DOB: 12-11-1965 MRN: [id number]  PREOP DIAGNOSIS:   LEFT BREAST CANCER  POSTOP DIAGNOSIS:    Left breast cancer, 2 o'clock position (T2, N0)  PROCEDURE:   Procedure(s):  LEFT BREAST LUMPECTOMY WITH RADIOACTIVE SEED AND LEFT AXILLARY SENTINEL LYMPH NODE BIOPSY, Injection of peri areolar area of breast with methylene blue (1.0 cc), deep sentinel lymph node biopsy  SURGEON:   Alphonsa Overall, M.D.  ANESTHESIA:   General  Anesthesiologist: Nolon Nations, MD CRNA: Lavonia Dana, CRNA  General  EBL:  75  ml  DRAINS:  none   LOCAL MEDICATIONS USED:   10 cc 1/4% marcaine, left pectoral block by anesthesia  SPECIMEN:   Left breast lumpectomy (6 color paint), Left inferior margin, and left axillary sentinel lymph node (counts 110, background 0, not blue)  COUNTS CORRECT:  YES  INDICATIONS FOR PROCEDURE:  Veronica Velazquez is a 54 y.o. (DOB: January 12, 1966) [place] female whose primary care physician is Sagardia, Ines Bloomer, MD and comes for left breast lumpectomy and left axillary sentinel lymph node biopsy.   She has undergone neoadjuvant chemotherapy by Dr. Lindi Adie.   The options for breast cancer treatment have been discussed with the patient. She elected to proceed with lumpectomy and axillary sentinel lymph node.   Her daughter, Veronica Velazquez, has been her interpreter.    The indications and potential complications of surgery were explained to the patient. Potential complications include, but are not limited to, bleeding, infection, the need for further surgery, and nerve injury.     She had a I131 seed placed on 01/06/2020 in her left breast at [facility].  The seed is in the 2 o'clock position of the left breast.   In the holding area, her left  areola was injected with 1 millicurie of Technitium Sulfur Colloid.  OPERATIVE NOTE:   The patient was taken to operating room # 8 at [facility] where she underwent a  general anesthesia  supervised by Anesthesiologist: Nolon Nations, MD CRNA: Lavonia Dana, CRNA. Her left  breast and axilla were prepped with  ChloraPrep and sterilely draped.    A time-out was held and the surgical check list was reviewed.    I injected about 1.0 mL of 40% methylene blue around her left  areola.   The cancer was about at the 2 o'clock position of the left  breast.   It was 7 cm from the areola.  I used the Neoprobe to identify the I131 seed.  I tried to excise an area around the tumor of at least 1 cm.   She has pendulous large breast and I made the incision directly over the cancer.   I excised this block of breast tissue approximately 4 cm by 5 cm  in diameter.  I did take the dissection down to the chest wall.   I painted the lumpectomy specimen with the 6 color paint kit and did a specimen mammogram which confirmed the mass, clip, and the seed were all in the right position in the specimen.  The specimen was sent to pathology who called back to confirm that they have the seed and the specimen.   On the 3D image of the specimen, it looked like the calcifications went to the inferior margin of the specimen,so I took an additional inferior margin.  I painted the specimen and placed a suture in the anterior aspect of the specimen.   I then  started the left deep axillary sentinel lymph node biopsy. I made an incision in the left  axilla.  I found a hot area at the junction of the breast and the pectoralis major muscle, deep in the axilla. I cut down and  identified a hot node that had counts of 110 and the background has 0 counts. The lymph node was not blue. I checked her internal mammary nodes and supraclavicular nodes with the neoprobe and found no other hot area. The axillary node was then sent to pathology.    I then irrigated the wound with saline. I infiltrated  10 mL of 1/4% Marcaine between in the breast incision. I placed 4 clips to mark biopsy cavity, at 12, 3, 6, and 9  o'clock.  I then closed all the wounds in layers using 3-0 Vicryl sutures for the deep layer. At the skin, I closed the incisions with a 4-0 Monocryl suture. The incisions were then painted with Dermabond.  She had gauze place over the wounds and placed in a breast binder.   The patient tolerated the procedure well, was transported to the recovery room in good condition. Sponge and needle count were correct at the end of the case.   Final pathology is pending.   Alphonsa Overall, MD, [facility] Pager: [phone] Office phone:  [phone]

## 2020-01-07 NOTE — Anesthesia Procedure Notes (Signed)
Anesthesia Regional Block: Pectoralis block   Pre-Anesthetic Checklist: ,, timeout performed, Correct Patient, Correct Site, Correct Laterality, Correct Procedure, Correct Position, site marked, Risks and benefits discussed,  Surgical consent,  Pre-op evaluation,  At surgeon's request and post-op pain management  Laterality: Left  Prep: chloraprep       Needles:   Needle Type: Stimiplex     Needle Length: 9cm      Additional Needles:   Procedures:,,,, ultrasound used (permanent image in chart),,,,  Narrative:  Start time: 01/07/2020 9:45 AM End time: 01/07/2020 9:50 AM Injection made incrementally with aspirations every 5 mL.  Performed by: Personally  Anesthesiologist: Nolon Nations, MD  Additional Notes: Patient tolerated well. Good fascial spread noted.

## 2020-01-08 ENCOUNTER — Encounter: Payer: Self-pay | Admitting: *Deleted

## 2020-01-08 LAB — SURGICAL PATHOLOGY

## 2020-01-13 ENCOUNTER — Encounter: Payer: Self-pay | Admitting: *Deleted

## 2020-01-13 DIAGNOSIS — C50412 Malignant neoplasm of upper-outer quadrant of left female breast: Secondary | ICD-10-CM

## 2020-01-17 NOTE — Progress Notes (Signed)
Pharmacist Chemotherapy Monitoring - Follow Up Assessment    I verify that I have reviewed each item in the below checklist:  . Regimen for the patient is scheduled for the appropriate day and plan matches scheduled date. Marland Kitchen Appropriate non-routine labs are ordered dependent on drug ordered. . If applicable, additional medications reviewed and ordered per protocol based on lifetime cumulative doses and/or treatment regimen.   Plan for follow-up and/or issues identified: No . I-vent associated with next due treatment: No . MD and/or nursing notified: No  Acquanetta Belling 01/17/2020 8:36 AM

## 2020-01-21 ENCOUNTER — Other Ambulatory Visit: Payer: Self-pay

## 2020-01-21 DIAGNOSIS — C50412 Malignant neoplasm of upper-outer quadrant of left female breast: Secondary | ICD-10-CM

## 2020-01-21 NOTE — Progress Notes (Signed)
Patient Care Team: Horald Pollen, MD as PCP - General (Internal Medicine) Mauro Kaufmann, RN as Oncology Nurse Navigator Rockwell Germany, RN as Oncology Nurse Navigator Nicholas Lose, MD as Consulting Physician (Hematology and Oncology) Eppie Gibson, MD as Attending Physician (Radiation Oncology) Alphonsa Overall, MD as Consulting Physician (General Surgery)  DIAGNOSIS:    ICD-10-CM   1. Malignant neoplasm of upper-outer quadrant of left breast in female, estrogen receptor negative ([place])  C50.412    Z17.1     SUMMARY OF ONCOLOGIC HISTORY: Oncology History  Malignant neoplasm of upper-outer quadrant of left breast in female, estrogen receptor negative ([place])  09/30/2019 Initial Diagnosis   Patient palpated left breast mass. Mammogram showed a 2.3cm mass at the 2 o'clock position, normal-appearing axillary lymph nodes. Biopsy showed IDC, grade 3, HER-2 + (3+), ER/PR -, Ki67 40%.   10/02/2019 Cancer Staging   Staging form: Breast, AJCC 8th Edition - Clinical stage from 10/02/2019: Stage IIA (cT2, cN0, cM0, G3, ER-, PR-, HER2+) - Signed by Nicholas Lose, MD on 10/02/2019   10/16/2019 - 12/10/2019 Chemotherapy   Taxol Herceptin neoadjuvant chemotherapy x5 (stopped early due to neuropathy toxicity)    Genetic Testing   Negative genetic testing. No pathogenic variants identified on the [facility] Common Hereditary Cancers Panel. The report date is 11/26/2019.  The Common Hereditary Cancers Panel offered by [facility] includes sequencing and/or deletion duplication testing of the following 48 genes: APC, ATM, AXIN2, BARD1, BMPR1A, BRCA1, BRCA2, BRIP1, CDH1, CDKN2A (p14ARF), CDKN2A (p16INK4a), CKD4, CHEK2, CTNNA1, DICER1, EPCAM (Deletion/duplication testing only), GREM1 (promoter region deletion/duplication testing only), KIT, MEN1, MLH1, MSH2, MSH3, MSH6, MUTYH, NBN, NF1, NHTL1, PALB2, PDGFRA, PMS2, POLD1, POLE, PTEN, RAD50, RAD51C, RAD51D, RNF43, SDHB, SDHC, SDHD, SMAD4, SMARCA4. STK11, TP53,  TSC1, TSC2, and VHL.  The following genes were evaluated for sequence changes only: SDHA and HOXB13 c.251G>A variant only.   12/11/2019 -  Chemotherapy   Herceptin maintenance    01/07/2020 Surgery   Left lumpectomy Lucia Gaskins): no residual carcinoma, clear margins, 3 left axillary lymph nodes negative.     CHIEF COMPLIANT: Follow-up s/p lumpectomy to review pathology   INTERVAL HISTORY: Veronica Velazquez is a 54 y.o. with above-mentioned history of left breast cancer who completed neoadjuvant chemotherapy. She underwent a left lumpectomy on 01/07/20 with Dr. Lucia Gaskins for which pathology showed no residual carcinoma, clear margins, 3 left axillary lymph nodes negative for carcinoma.She presents to the clinic today to discuss the pathology report and further treatment.  She is complaining of mild to moderate pain at the surgical site.  She has appointment to see radiation oncology.  ALLERGIES:  has No Known Allergies.  MEDICATIONS:  Current Outpatient Medications  Medication Sig Dispense Refill  . albuterol (PROVENTIL HFA;VENTOLIN HFA) 108 (90 Base) MCG/ACT inhaler Inhale 2 puffs into the lungs every 3 (three) hours as needed for wheezing (cough, shortness of breath or wheezing.). 1 Inhaler 3  . atenolol-chlorthalidone (TENORETIC) 100-25 MG tablet Take 0.5 tablets by mouth daily. 45 tablet 0  . ibuprofen (ADVIL) 800 MG tablet Take 1 tablet (800 mg total) by mouth every 8 (eight) hours as needed. 30 tablet 0  . levothyroxine (SYNTHROID) 100 MCG tablet Take 1 tablet (100 mcg total) by mouth daily. 30 tablet 1  . lisinopril (ZESTRIL) 20 MG tablet Take 1 tablet (20 mg total) by mouth daily. 90 tablet 0  . methocarbamol (ROBAXIN) 500 MG tablet Take 1 tablet (500 mg total) by mouth 4 (four) times daily. 30 tablet 0  .  simvastatin (ZOCOR) 20 MG tablet TAKE 1 TABLET (20 MG TOTAL) BY MOUTH DAILY. NEEDS OFFICE VISIT FOR FURTHER REFILLS 90 tablet 0  . traMADol (ULTRAM) 50 MG tablet Take 1 tablet (50 mg  total) by mouth every 6 (six) hours as needed for moderate pain or severe pain. 10 tablet 0  . traMADol (ULTRAM) 50 MG tablet Take 1 tablet (50 mg total) by mouth every 6 (six) hours as needed. 20 tablet 0   No current facility-administered medications for this visit.   Facility-Administered Medications Ordered in Other Visits  Medication Dose Route Frequency Provider Last Rate Last Admin  . sodium chloride flush (NS) 0.9 % injection 10 mL  10 mL Intracatheter Once Nicholas Lose, MD        PHYSICAL EXAMINATION: ECOG PERFORMANCE STATUS: 1 - Symptomatic but completely ambulatory  There were no vitals filed for this visit. There were no vitals filed for this visit.  LABORATORY DATA:  I have reviewed the data as listed CMP Latest Ref Rng & Units 01/03/2020 11/20/2019 11/13/2019  Glucose 70 - 99 mg/dL 198(H) 172(H) 194(H)  BUN 6 - 20 mg/dL '17 15 13  '$ Creatinine 0.44 - 1.00 mg/dL 0.74 0.70 0.78  Sodium 135 - 145 mmol/L 135 135 132(L)  Potassium 3.5 - 5.1 mmol/L 3.9 3.5 3.5  Chloride 98 - 111 mmol/L 99 97(L) 97(L)  CO2 22 - 32 mmol/L '23 27 27  '$ Calcium 8.9 - 10.3 mg/dL 9.6 9.2 9.2  Total Protein 6.5 - 8.1 g/dL - 7.2 7.1  Total Bilirubin 0.3 - 1.2 mg/dL - 0.4 0.5  Alkaline Phos 38 - 126 U/L - 78 85  AST 15 - 41 U/L - 13(L) 17  ALT 0 - 44 U/L - 20 21    Lab Results  Component Value Date   WBC 9.4 01/22/2020   HGB 10.5 (L) 01/22/2020   HCT 34.1 (L) 01/22/2020   MCV 77.9 (L) 01/22/2020   PLT 359 01/22/2020   NEUTROABS 6.0 01/22/2020    ASSESSMENT & PLAN:  Malignant neoplasm of upper-outer quadrant of left breast in female, estrogen receptor negative ([place]) 09/30/2019:Patient palpated left breast mass. Mammogram showed a 2.3cm mass at the 2 o'clock position, normal-appearing axillary lymph nodes. Biopsy showed IDC, grade 3, HER-2 + (3+), ER/PR -, Ki67 40%. T2 N0 stage IIa clinical stage  Treatment plan: 1.Neoadjuvant chemotherapy with Taxol-Herceptin weekly X5(neuropathy caused  discontinuation of Taxol) followed by Herceptin maintenance for 1 year   2.Breast conserving surgery with sentinel lymph node biopsy (Dr.Newman) 01/07/20: Path CR, 0/3 LN 3.Adjuvant radiation ----------------------------------------------------------------------------------------------------------------------------------------------------- Pathology counseling: I discussed the final pathology report of the patient provided  a copy of this report. I discussed the margins as well as lymph node surgeries. We also discussed the final staging along with previously performed ER/PR and HER-2/neu testing.  Plan: continue maintenance Herceptin q 3 weeks. Referral for adj XRT No role of anti-estrogen therapy  Return to clinic every 3 weeks for Herceptin every 6 weeks to follow-up with me.  No orders of the defined types were placed in this encounter.  The patient has a good understanding of the overall plan. she agrees with it. she will call with any problems that may develop before the next visit here.  Total time spent: 30 mins including face to face time and time spent for planning, charting and coordination of care  Nicholas Lose, MD 01/22/2020  I, Cloyde Reams Dorshimer, am acting as scribe for Dr. Nicholas Lose.  I have reviewed the above documentation  for accuracy and completeness, and I agree with the above.

## 2020-01-22 ENCOUNTER — Other Ambulatory Visit: Payer: Self-pay

## 2020-01-22 ENCOUNTER — Inpatient Hospital Stay: Payer: BC Managed Care – PPO

## 2020-01-22 ENCOUNTER — Inpatient Hospital Stay (HOSPITAL_BASED_OUTPATIENT_CLINIC_OR_DEPARTMENT_OTHER): Payer: BC Managed Care – PPO | Admitting: Hematology and Oncology

## 2020-01-22 ENCOUNTER — Inpatient Hospital Stay: Payer: BC Managed Care – PPO | Attending: Hematology and Oncology

## 2020-01-22 ENCOUNTER — Encounter: Payer: Self-pay | Admitting: *Deleted

## 2020-01-22 VITALS — BP 128/77

## 2020-01-22 DIAGNOSIS — Z171 Estrogen receptor negative status [ER-]: Secondary | ICD-10-CM | POA: Diagnosis not present

## 2020-01-22 DIAGNOSIS — Z95828 Presence of other vascular implants and grafts: Secondary | ICD-10-CM

## 2020-01-22 DIAGNOSIS — Z9221 Personal history of antineoplastic chemotherapy: Secondary | ICD-10-CM | POA: Diagnosis not present

## 2020-01-22 DIAGNOSIS — Z79899 Other long term (current) drug therapy: Secondary | ICD-10-CM | POA: Diagnosis not present

## 2020-01-22 DIAGNOSIS — Z5112 Encounter for antineoplastic immunotherapy: Secondary | ICD-10-CM | POA: Diagnosis not present

## 2020-01-22 DIAGNOSIS — C50412 Malignant neoplasm of upper-outer quadrant of left female breast: Secondary | ICD-10-CM | POA: Insufficient documentation

## 2020-01-22 LAB — CBC WITH DIFFERENTIAL (CANCER CENTER ONLY)
Abs Immature Granulocytes: 0.02 10*3/uL (ref 0.00–0.07)
Basophils Absolute: 0 10*3/uL (ref 0.0–0.1)
Basophils Relative: 0 %
Eosinophils Absolute: 0.4 10*3/uL (ref 0.0–0.5)
Eosinophils Relative: 5 %
HCT: 34.1 % — ABNORMAL LOW (ref 36.0–46.0)
Hemoglobin: 10.5 g/dL — ABNORMAL LOW (ref 12.0–15.0)
Immature Granulocytes: 0 %
Lymphocytes Relative: 22 %
Lymphs Abs: 2 10*3/uL (ref 0.7–4.0)
MCH: 24 pg — ABNORMAL LOW (ref 26.0–34.0)
MCHC: 30.8 g/dL (ref 30.0–36.0)
MCV: 77.9 fL — ABNORMAL LOW (ref 80.0–100.0)
Monocytes Absolute: 0.9 10*3/uL (ref 0.1–1.0)
Monocytes Relative: 9 %
Neutro Abs: 6 10*3/uL (ref 1.7–7.7)
Neutrophils Relative %: 64 %
Platelet Count: 359 10*3/uL (ref 150–400)
RBC: 4.38 MIL/uL (ref 3.87–5.11)
RDW: 15.8 % — ABNORMAL HIGH (ref 11.5–15.5)
WBC Count: 9.4 10*3/uL (ref 4.0–10.5)
nRBC: 0 % (ref 0.0–0.2)

## 2020-01-22 LAB — CMP (CANCER CENTER ONLY)
ALT: 17 U/L (ref 0–44)
AST: 14 U/L — ABNORMAL LOW (ref 15–41)
Albumin: 3.2 g/dL — ABNORMAL LOW (ref 3.5–5.0)
Alkaline Phosphatase: 111 U/L (ref 38–126)
Anion gap: 8 (ref 5–15)
BUN: 14 mg/dL (ref 6–20)
CO2: 27 mmol/L (ref 22–32)
Calcium: 9.4 mg/dL (ref 8.9–10.3)
Chloride: 104 mmol/L (ref 98–111)
Creatinine: 0.73 mg/dL (ref 0.44–1.00)
GFR, Est AFR Am: >60 mL/min (ref >60)
GFR, Estimated: >60 mL/min (ref >60)
Glucose, Bld: 132 mg/dL — ABNORMAL HIGH (ref 70–99)
Potassium: 4.1 mmol/L (ref 3.5–5.1)
Sodium: 139 mmol/L (ref 135–145)
Total Bilirubin: 0.3 mg/dL (ref 0.3–1.2)
Total Protein: 7.3 g/dL (ref 6.5–8.1)

## 2020-01-22 MED ORDER — TRASTUZUMAB-DKST CHEMO 150 MG IV SOLR
6.0000 mg/kg | Freq: Once | INTRAVENOUS | Status: AC
  Administered 2020-01-22: 672 mg via INTRAVENOUS
  Filled 2020-01-22: qty 32

## 2020-01-22 MED ORDER — SODIUM CHLORIDE 0.9 % IV SOLN
Freq: Once | INTRAVENOUS | Status: AC
  Filled 2020-01-22: qty 250

## 2020-01-22 MED ORDER — SODIUM CHLORIDE 0.9% FLUSH
10.0000 mL | INTRAVENOUS | Status: DC | PRN
  Filled 2020-01-22: qty 10

## 2020-01-22 MED ORDER — ACETAMINOPHEN 325 MG PO TABS
ORAL_TABLET | ORAL | Status: AC
  Filled 2020-01-22: qty 2

## 2020-01-22 MED ORDER — HEPARIN SOD (PORK) LOCK FLUSH 100 UNIT/ML IV SOLN
500.0000 [IU] | Freq: Once | INTRAVENOUS | Status: DC | PRN
  Filled 2020-01-22: qty 5

## 2020-01-22 MED ORDER — SODIUM CHLORIDE 0.9% FLUSH
10.0000 mL | Freq: Once | INTRAVENOUS | Status: DC
  Filled 2020-01-22: qty 10

## 2020-01-22 MED ORDER — ACETAMINOPHEN 325 MG PO TABS
650.0000 mg | ORAL_TABLET | Freq: Once | ORAL | Status: AC
  Administered 2020-01-22: 650 mg via ORAL

## 2020-01-22 MED ORDER — DIPHENHYDRAMINE HCL 25 MG PO CAPS
ORAL_CAPSULE | ORAL | Status: AC
  Filled 2020-01-22: qty 1

## 2020-01-22 MED ORDER — DIPHENHYDRAMINE HCL 25 MG PO CAPS
25.0000 mg | ORAL_CAPSULE | Freq: Once | ORAL | Status: AC
  Administered 2020-01-22: 25 mg via ORAL

## 2020-01-22 NOTE — Patient Instructions (Signed)

## 2020-01-22 NOTE — Assessment & Plan Note (Signed)
09/30/2019:Patient palpated left breast mass. Mammogram showed a 2.3cm mass at the 2 o'clock position, normal-appearing axillary lymph nodes. Biopsy showed IDC, grade 3, HER-2 + (3+), ER/PR -, Ki67 40%. T2 N0 stage IIa clinical stage  Treatment plan: 1.Neoadjuvant chemotherapy with Taxol-Herceptin weekly X5(neuropathy caused discontinuation of Taxol) followed by Herceptin maintenance for 1 year versus Kadcyla maintenance depending on the final response 2.Breast conserving surgery with sentinel lymph node biopsy (Dr.Newman) 01/07/20: Path CR, 0/3 LN 3.Adjuvant radiation ----------------------------------------------------------------------------------------------------------------------------------------------------- Pathology counseling: I discussed the final pathology report of the patient provided  a copy of this report. I discussed the margins as well as lymph node surgeries. We also discussed the final staging along with previously performed ER/PR and HER-2/neu testing.  Plan: continue maintenance Herceptin q 3 weeks. Referral for adj XRT No role of anti-estrogen therapy

## 2020-01-22 NOTE — Patient Instructions (Signed)
[facility] Discharge Instructions for Patients Receiving Chemotherapy  Today you received the following chemotherapy agents trastuzumab.  To help prevent nausea and vomiting after your treatment, we encourage you to take your nausea medication as directed.    If you develop nausea and vomiting that is not controlled by your nausea medication, call the clinic.   BELOW ARE SYMPTOMS THAT SHOULD BE REPORTED IMMEDIATELY:  *FEVER GREATER THAN 100.5 F  *CHILLS WITH OR WITHOUT FEVER  NAUSEA AND VOMITING THAT IS NOT CONTROLLED WITH YOUR NAUSEA MEDICATION  *UNUSUAL SHORTNESS OF BREATH  *UNUSUAL BRUISING OR BLEEDING  TENDERNESS IN MOUTH AND THROAT WITH OR WITHOUT PRESENCE OF ULCERS  *URINARY PROBLEMS  *BOWEL PROBLEMS  UNUSUAL RASH Items with * indicate a potential emergency and should be followed up as soon as possible.  Feel free to call the clinic should you have any questions or concerns. The clinic phone number is [phone].  Please show the CHEMO ALERT CARD at check-in to the Emergency Department and triage nurse.   

## 2020-01-24 ENCOUNTER — Encounter: Payer: Self-pay | Admitting: *Deleted

## 2020-01-28 ENCOUNTER — Encounter: Payer: Self-pay | Admitting: Emergency Medicine

## 2020-01-28 ENCOUNTER — Telehealth (INDEPENDENT_AMBULATORY_CARE_PROVIDER_SITE_OTHER): Payer: BC Managed Care – PPO | Admitting: Emergency Medicine

## 2020-01-28 ENCOUNTER — Other Ambulatory Visit: Payer: Self-pay

## 2020-01-28 DIAGNOSIS — E785 Hyperlipidemia, unspecified: Secondary | ICD-10-CM

## 2020-01-28 MED ORDER — SIMVASTATIN 20 MG PO TABS: 20.0000 mg | ORAL_TABLET | Freq: Every day | ORAL | 3 refills | Status: DC

## 2020-01-28 NOTE — Patient Instructions (Signed)
° ° ° °  If you have lab work done today you will be contacted with your lab results within the next 2 weeks.  If you have not heard from us then please contact us. The fastest way to get your results is to register for My Chart. ° ° °IF you received an x-ray today, you will receive an invoice from [facility]. Please contact [facility] at [phone] with questions or concerns regarding your invoice.  ° °IF you received labwork today, you will receive an invoice from [facility]. Please contact [facility] at [phone] with questions or concerns regarding your invoice.  ° °Our billing staff will not be able to assist you with questions regarding bills from these companies. ° °You will be contacted with the lab results as soon as they are available. The fastest way to get your results is to activate your My Chart account. Instructions are located on the last page of this paperwork. If you have not heard from us regarding the results in 2 weeks, please contact this office. °  ° ° ° °

## 2020-01-28 NOTE — Progress Notes (Signed)
Refill simvastatin 

## 2020-02-03 NOTE — Progress Notes (Signed)
Location of Breast Cancer:  Malignant neoplasm of upper-outer quadrant of LEFT breast, estrogen receptor negative   Histology per Pathology Report:  09/24/2019 Diagnosis Breast, left, needle core biopsy, 2 o'clock - INVASIVE DUCTAL CARCINOMA, GRADE III. - SEE MICROSCOPIC DESCRIPTION. Microscopic Comment The greatest tumor dimension is 1.7 cm. 01/07/2020 FINAL MICROSCOPIC DIAGNOSIS:  A. BREAST, LEFT, LUMPECTOMY:  - No residual carcinoma identified.  - Treatment effect and biopsy site.  - Resection margins are negative.  - See oncology table.  B. LYMPH NODE, LEFT AXILLARY, SENTINEL, BIOPSY:  - One of one lymph nodes negative for carcinoma (0/1).  C. LYMPH NODE, LEFT AXILLARY, SENTINEL, BIOPSY:  - One of one lymph nodes negative for carcinoma (0/1).  D. LYMPH NODE, LEFT AXILLARY, SENTINEL, BIOPSY:  - One of one lymph nodes negative for carcinoma (0/1).  E. BREAST, LEFT ADDITIONAL INFERIOR MARGIN, EXCISION:  - Fibrocystic change.  - No malignancy identified.   Receptor Status: ER(0%), PR (0%), Her2-neu (positive), Ki-67(40%)  Did patient present with symptoms (if so, please note symptoms) or was this found on screening mammography?: Patient palpated left breast mass. Mammogram showed a 2.3cm mass at the 2 o'clock position, normal-appearing axillary lymph nodes  Past/Anticipated interventions by surgeon, if 01/07/2020 --Dr. Alphonsa Overall LEFT BREAST LUMPECTOMY WITH RADIOACTIVE SEED AND LEFT AXILLARY SENTINEL LYMPH NODE BIOPSY  Past/Anticipated interventions by medical oncology, if any: Under care of Dr. Nicholas Lose:  10/16/2019 - 12/10/2019: Taxol Herceptin neoadjuvant chemotherapy x5 (stopped early due to neuropathy toxicity) followed by Herceptin maintenance for 1 year    Lymphedema issues, if any:  None    Pain issues, if any:  Occasional discomfort when raising left arm, but able to do daily activities.    SAFETY ISSUES:  Prior radiation? No  Pacemaker/ICD? No  Possible  current pregnancy? No--hysterectomy  Is the patient on methotrexate? No  Current Complaints / other details:   Nothing of note    Zola Button, RN 02/03/2020,5:10 PM

## 2020-02-04 ENCOUNTER — Encounter: Payer: Self-pay | Admitting: Radiation Oncology

## 2020-02-04 ENCOUNTER — Ambulatory Visit
Admission: RE | Admit: 2020-02-04 | Discharge: 2020-02-04 | Disposition: A | Discharge status: Home / Self Care | Payer: BC Managed Care – PPO | Source: Ambulatory Visit | Attending: Radiation Oncology | Admitting: Radiation Oncology

## 2020-02-04 ENCOUNTER — Other Ambulatory Visit: Payer: Self-pay

## 2020-02-04 VITALS — BP 156/95 | HR 75 | Temp 98.7°F | Resp 18 | Ht 62.0 in | Wt 253.0 lb

## 2020-02-04 DIAGNOSIS — C50412 Malignant neoplasm of upper-outer quadrant of left female breast: Secondary | ICD-10-CM | POA: Diagnosis not present

## 2020-02-04 DIAGNOSIS — Z171 Estrogen receptor negative status [ER-]: Secondary | ICD-10-CM | POA: Diagnosis not present

## 2020-02-04 DIAGNOSIS — Z9889 Other specified postprocedural states: Secondary | ICD-10-CM | POA: Diagnosis not present

## 2020-02-04 NOTE — Progress Notes (Signed)
Radiation Oncology         [phone] ________________________________  Name: Veronica Velazquez MRN: [id number]  Date: 02/04/2020  DOB: Apr 19, 1966  Follow-Up Visit Note in person  Outpatient  CC: Sagardia, Ines Bloomer, MD  Nicholas Lose, MD  Diagnosis:      ICD-10-CM   1. Malignant neoplasm of upper-outer quadrant of left breast in female, estrogen receptor negative ([place])  C50.412    Z17.1      Cancer Staging Malignant neoplasm of upper-outer quadrant of left breast in female, estrogen receptor negative ([place]) Staging form: Breast, AJCC 8th Edition - Clinical stage from 10/02/2019: Stage IIA (cT2, cN0, cM0, G3, ER-, PR-, HER2+) - Signed by Nicholas Lose, MD on 10/02/2019 [id number]  CHIEF COMPLAINT: Here to discuss management of left breast cancer  Narrative:  The patient returns today for follow-up to discuss radiation treatment options. She was seen in the multidisciplinary breast clinic on 10/02/2019.     Since consultation date, she was treated with systemic therapy by Dr. Lindi Adie: 5 cycles of taxol and herceptin, 10/16/2019 - 12/10/2019 (stopped early due to neuropathy); maintenance herceptin, 12/11/2019 to present.  She opted to proceed with left lumpectomy on date of 01/07/2020 with pathology report revealing: no residual carcinoma; nodal status of negative (0/3).  Of note, she also underwent genetic counseling on 11/18/2019, which was negative.  Symptomatically, the patient reports: No concerns.  She and her daughter have not yet been vaccinated though they are taking Covid precautions.  Her daughter provides the history through translation.  She continues Herceptin every 3 weeks.        ALLERGIES:  has No Known Allergies.  Meds: Current Outpatient Medications  Medication Sig Dispense Refill   albuterol (PROVENTIL HFA;VENTOLIN HFA) 108 (90 Base) MCG/ACT inhaler Inhale 2 puffs into the lungs every 3 (three) hours as needed for wheezing (cough, shortness of breath or  wheezing.). 1 Inhaler 3   atenolol-chlorthalidone (TENORETIC) 100-25 MG tablet Take 0.5 tablets by mouth daily. 45 tablet 0   ibuprofen (ADVIL) 800 MG tablet Take 1 tablet (800 mg total) by mouth every 8 (eight) hours as needed. 30 tablet 0   lisinopril (ZESTRIL) 20 MG tablet Take 1 tablet (20 mg total) by mouth daily. 90 tablet 0   methocarbamol (ROBAXIN) 500 MG tablet Take 1 tablet (500 mg total) by mouth 4 (four) times daily. 30 tablet 0   simvastatin (ZOCOR) 20 MG tablet Take 1 tablet (20 mg total) by mouth daily. 90 tablet 3   traMADol (ULTRAM) 50 MG tablet Take 1 tablet (50 mg total) by mouth every 6 (six) hours as needed for moderate pain or severe pain. 10 tablet 0   traMADol (ULTRAM) 50 MG tablet Take 1 tablet (50 mg total) by mouth every 6 (six) hours as needed. 20 tablet 0   levothyroxine (SYNTHROID) 100 MCG tablet Take 1 tablet (100 mcg total) by mouth daily. 30 tablet 1   No current facility-administered medications for this encounter.    Physical Findings:  height is '5\' 2"'$  (1.575 m) and weight is 253 lb (114.8 kg). Her temporal temperature is 98.7 F (37.1 C). Her blood pressure is 156/95 (abnormal) and her pulse is 75. Her respiration is 18 and oxygen saturation is 100%. .     General: Alert and oriented, in no acute distress  Breast exam: Left breast axillary and lumpectomy scar healing well  Lab Findings: Lab Results  Component Value Date   WBC 9.4 01/22/2020   HGB 10.5 (L)  01/22/2020   HCT 34.1 (L) 01/22/2020   MCV 77.9 (L) 01/22/2020   PLT 359 01/22/2020    Radiographic Findings: NM Sentinel Node Inj-No Rpt (Breast)  Result Date: 01/07/2020 Sulfur colloid was injected by the nuclear medicine technologist for melanoma sentinel node.   MM Breast Surgical Specimen  Result Date: 01/07/2020 CLINICAL DATA:  54 year old undergoing malignant lumpectomy of the UPPER OUTER QUADRANT of the LEFT breast after neoadjuvant chemotherapy. Radioactive seed localization  was performed yesterday. EXAM: SPECIMEN RADIOGRAPH OF THE LEFT BREAST COMPARISON:  Previous exam(s). FINDINGS: Status post excision of the LEFT breast. The radioactive seed and the ribbon shaped tissue marker clip are present in the gross specimen. This was discussed by telephone with the operating room nurse at the time of interpretation on 01/07/2020 at 11 o'clock a.m. IMPRESSION: Specimen radiograph of the LEFT breast. Electronically Signed   By: Evangeline Dakin M.D.   On: 01/07/2020 11:01   MM LT RADIOACTIVE SEED LOC MAMMO GUIDE  Result Date: 01/06/2020 CLINICAL DATA:  54 year old who has a biopsy-proven grade 3 invasive ductal carcinoma involving the UPPER OUTER QUADRANT of the LEFT breast for which the patient recently completed neoadjuvant chemotherapy. Radioactive seed localization is performed in anticipation of lumpectomy which is scheduled for tomorrow. EXAM: MAMMOGRAPHIC GUIDED RADIOACTIVE SEED LOCALIZATION OF THE LEFT BREAST COMPARISON:  Previous exam(s). FINDINGS: Patient presents for radioactive seed localization prior to LEFT breast lumpectomy. I met with the patient and we discussed the procedure of seed localization including benefits and alternatives. We discussed the high likelihood of a successful procedure. We discussed the risks of the procedure including infection, bleeding, tissue injury and further surgery. We discussed the low dose of radioactivity involved in the procedure. Informed, written consent was given. The patient's daughter assisted with translation. The usual time-out protocol was performed immediately prior to the procedure. Using mammographic guidance, sterile technique with chlorhexidine as skin antisepsis, 1% lidocaine as local anesthesia, an I-125 radioactive seed was used to localize the ribbon shaped tissue marker clip placed at the time of biopsy and the associated suspicious calcifications in the UPPER OUTER LEFT breast using a superior approach. The follow-up  mammogram images confirm that the seed is appropriately positioned approximately 6 mm superior to the clip. The calcifications extend approximately 1.2-1.5 cm in all directions away from the seed and clip (except superiorly). The images are marked for Dr. Lucia Gaskins. Follow-up survey of the patient confirms the presence of the radioactive seed. Order number of I-125 seed: [id number] Total activity: 0.250 mCi Reference Date: 12/27/2019 The patient tolerated the procedure well and was released from the [facility]. She was given instructions regarding seed removal. IMPRESSION: Radioactive seed localization of the UPPER OUTER QUADRANT of the LEFT breast. No apparent complications. The seed is approximately 6 mm superior to the ribbon shaped tissue marker clip. Calcifications extend in all directions approximately 1.2-1.5 cm away from the clip and seed (except superiorly). Electronically Signed   By: Evangeline Dakin M.D.   On: 01/06/2020 13:53    Impression/Plan: Left Breast Cancer  We discussed adjuvant radiotherapy today.  I recommend for weeks directed at the left breast in order to reduce the risk of locoregional recurrence by 2/3.  The risks, benefits and side effects of this treatment were discussed in detail.  She understands that radiotherapy is associated with skin irritation and fatigue in the acute setting as well as cosmetic changes in the breast. Late effects can include cosmetic changes and rare injury to the ribs and internal  organs.  She is enthusiastic about proceeding with treatment.  No guarantees of treatment were given.  A consent form has been signed and placed in her chart.  We discussed ways to reduce risk factors for heart disease such as exercise, diet, preventative care regarding blood pressure and cholesterol.  We discussed techniques that will be used to shield her heart from radiotherapy.  We will proceed with CT simulation today.  We discussed measures to reduce the risk of  infection during the COVID-19 pandemic.  I recommended that the patient receive her vaccine as I believe the risks of the vaccine are far outweighed by the potential benefits.  For now, the patient and her daughter would like to hold off a bit longer but they know to contact me if they would like help scheduling their vaccine.  The patient's daughter had many good questions which I answered to the best of my ability.  I very much look forward to anticipating in Ms Name's care.  On date of service in total I spent 30 minutes on this encounter; patient was seen in person  _____________________________________   Eppie Gibson, MD   This document serves as a record of services personally performed by Eppie Gibson, MD. It was created on her behalf by Wilburn Mylar, a trained medical scribe. The creation of this record is based on the scribe's personal observations and the provider's statements to them. This document has been checked and approved by the attending provider.

## 2020-02-05 ENCOUNTER — Encounter: Payer: Self-pay | Admitting: *Deleted

## 2020-02-06 NOTE — Progress Notes (Signed)
Pharmacist Chemotherapy Monitoring - Follow Up Assessment    I verify that I have reviewed each item in the below checklist:  . Regimen for the patient is scheduled for the appropriate day and plan matches scheduled date. Marland Kitchen Appropriate non-routine labs are ordered dependent on drug ordered. . If applicable, additional medications reviewed and ordered per protocol based on lifetime cumulative doses and/or treatment regimen.   Plan for follow-up and/or issues identified: No . I-vent associated with next due treatment: No . MD and/or nursing notified: No  Veronica Velazquez 02/06/2020 11:34 AM

## 2020-02-07 DIAGNOSIS — C50412 Malignant neoplasm of upper-outer quadrant of left female breast: Secondary | ICD-10-CM | POA: Diagnosis not present

## 2020-02-07 DIAGNOSIS — Z171 Estrogen receptor negative status [ER-]: Secondary | ICD-10-CM | POA: Diagnosis not present

## 2020-02-12 ENCOUNTER — Inpatient Hospital Stay: Payer: BC Managed Care – PPO | Attending: Hematology and Oncology

## 2020-02-12 ENCOUNTER — Inpatient Hospital Stay: Payer: BC Managed Care – PPO

## 2020-02-12 ENCOUNTER — Other Ambulatory Visit: Payer: BC Managed Care – PPO

## 2020-02-12 ENCOUNTER — Other Ambulatory Visit: Payer: Self-pay

## 2020-02-12 ENCOUNTER — Ambulatory Visit: Payer: BC Managed Care – PPO

## 2020-02-12 VITALS — BP 139/91 | HR 73 | Temp 98.5°F | Resp 18 | Wt 252.5 lb

## 2020-02-12 DIAGNOSIS — Z95828 Presence of other vascular implants and grafts: Secondary | ICD-10-CM

## 2020-02-12 DIAGNOSIS — Z171 Estrogen receptor negative status [ER-]: Secondary | ICD-10-CM | POA: Insufficient documentation

## 2020-02-12 DIAGNOSIS — C50412 Malignant neoplasm of upper-outer quadrant of left female breast: Secondary | ICD-10-CM | POA: Insufficient documentation

## 2020-02-12 DIAGNOSIS — Z5112 Encounter for antineoplastic immunotherapy: Secondary | ICD-10-CM | POA: Insufficient documentation

## 2020-02-12 LAB — CBC WITH DIFFERENTIAL (CANCER CENTER ONLY)
Abs Immature Granulocytes: 0.03 10*3/uL (ref 0.00–0.07)
Basophils Absolute: 0 10*3/uL (ref 0.0–0.1)
Basophils Relative: 0 %
Eosinophils Absolute: 0.3 10*3/uL (ref 0.0–0.5)
Eosinophils Relative: 3 %
HCT: 35.4 % — ABNORMAL LOW (ref 36.0–46.0)
Hemoglobin: 10.8 g/dL — ABNORMAL LOW (ref 12.0–15.0)
Immature Granulocytes: 0 %
Lymphocytes Relative: 24 %
Lymphs Abs: 2.5 10*3/uL (ref 0.7–4.0)
MCH: 23.3 pg — ABNORMAL LOW (ref 26.0–34.0)
MCHC: 30.5 g/dL (ref 30.0–36.0)
MCV: 76.5 fL — ABNORMAL LOW (ref 80.0–100.0)
Monocytes Absolute: 0.8 10*3/uL (ref 0.1–1.0)
Monocytes Relative: 8 %
Neutro Abs: 6.5 10*3/uL (ref 1.7–7.7)
Neutrophils Relative %: 65 %
Platelet Count: 270 10*3/uL (ref 150–400)
RBC: 4.63 MIL/uL (ref 3.87–5.11)
RDW: 15.6 % — ABNORMAL HIGH (ref 11.5–15.5)
WBC Count: 10.2 10*3/uL (ref 4.0–10.5)
nRBC: 0 % (ref 0.0–0.2)

## 2020-02-12 LAB — CMP (CANCER CENTER ONLY)
ALT: 23 U/L (ref 0–44)
AST: 18 U/L (ref 15–41)
Albumin: 3.3 g/dL — ABNORMAL LOW (ref 3.5–5.0)
Alkaline Phosphatase: 122 U/L (ref 38–126)
Anion gap: 10 (ref 5–15)
BUN: 19 mg/dL (ref 6–20)
CO2: 27 mmol/L (ref 22–32)
Calcium: 9.7 mg/dL (ref 8.9–10.3)
Chloride: 100 mmol/L (ref 98–111)
Creatinine: 0.75 mg/dL (ref 0.44–1.00)
GFR, Est AFR Am: >60 mL/min (ref >60)
GFR, Estimated: >60 mL/min (ref >60)
Glucose, Bld: 145 mg/dL — ABNORMAL HIGH (ref 70–99)
Potassium: 3.9 mmol/L (ref 3.5–5.1)
Sodium: 137 mmol/L (ref 135–145)
Total Bilirubin: 0.4 mg/dL (ref 0.3–1.2)
Total Protein: 7.6 g/dL (ref 6.5–8.1)

## 2020-02-12 MED ORDER — DIPHENHYDRAMINE HCL 25 MG PO CAPS
ORAL_CAPSULE | ORAL | Status: AC
  Filled 2020-02-12: qty 1

## 2020-02-12 MED ORDER — SODIUM CHLORIDE 0.9% FLUSH
10.0000 mL | INTRAVENOUS | Status: DC | PRN
  Administered 2020-02-12: 10 mL
  Filled 2020-02-12: qty 10

## 2020-02-12 MED ORDER — TRASTUZUMAB-DKST CHEMO 150 MG IV SOLR
6.0000 mg/kg | Freq: Once | INTRAVENOUS | Status: AC
  Administered 2020-02-12: 672 mg via INTRAVENOUS
  Filled 2020-02-12: qty 32

## 2020-02-12 MED ORDER — DIPHENHYDRAMINE HCL 25 MG PO CAPS
25.0000 mg | ORAL_CAPSULE | Freq: Once | ORAL | Status: AC
  Administered 2020-02-12: 25 mg via ORAL

## 2020-02-12 MED ORDER — ACETAMINOPHEN 325 MG PO TABS
ORAL_TABLET | ORAL | Status: AC
  Filled 2020-02-12: qty 2

## 2020-02-12 MED ORDER — SODIUM CHLORIDE 0.9 % IV SOLN
Freq: Once | INTRAVENOUS | Status: AC
  Filled 2020-02-12: qty 250

## 2020-02-12 MED ORDER — ACETAMINOPHEN 325 MG PO TABS
650.0000 mg | ORAL_TABLET | Freq: Once | ORAL | Status: AC
  Administered 2020-02-12: 650 mg via ORAL

## 2020-02-12 MED ORDER — HEPARIN SOD (PORK) LOCK FLUSH 100 UNIT/ML IV SOLN
500.0000 [IU] | Freq: Once | INTRAVENOUS | Status: AC | PRN
  Administered 2020-02-12: 500 [IU]
  Filled 2020-02-12: qty 5

## 2020-02-12 MED ORDER — SODIUM CHLORIDE 0.9% FLUSH
10.0000 mL | Freq: Once | INTRAVENOUS | Status: AC
  Administered 2020-02-12: 10 mL
  Filled 2020-02-12: qty 10

## 2020-02-12 NOTE — Patient Instructions (Signed)
Cancer Center Discharge Instructions for Patients Receiving Chemotherapy  Today you received the following chemotherapy agents trastuzumab.  To help prevent nausea and vomiting after your treatment, we encourage you to take your nausea medication as directed.    If you develop nausea and vomiting that is not controlled by your nausea medication, call the clinic.   BELOW ARE SYMPTOMS THAT SHOULD BE REPORTED IMMEDIATELY:  *FEVER GREATER THAN 100.5 F  *CHILLS WITH OR WITHOUT FEVER  NAUSEA AND VOMITING THAT IS NOT CONTROLLED WITH YOUR NAUSEA MEDICATION  *UNUSUAL SHORTNESS OF BREATH  *UNUSUAL BRUISING OR BLEEDING  TENDERNESS IN MOUTH AND THROAT WITH OR WITHOUT PRESENCE OF ULCERS  *URINARY PROBLEMS  *BOWEL PROBLEMS  UNUSUAL RASH Items with * indicate a potential emergency and should be followed up as soon as possible.  Feel free to call the clinic should you have any questions or concerns. The clinic phone number is [phone].  Please show the CHEMO ALERT CARD at check-in to the Emergency Department and triage nurse.   

## 2020-02-17 ENCOUNTER — Ambulatory Visit
Admission: RE | Admit: 2020-02-17 | Discharge: 2020-02-17 | Disposition: A | Discharge status: Home / Self Care | Payer: BC Managed Care – PPO | Source: Ambulatory Visit | Attending: Radiation Oncology | Admitting: Radiation Oncology

## 2020-02-17 ENCOUNTER — Other Ambulatory Visit: Payer: Self-pay

## 2020-02-17 DIAGNOSIS — C50412 Malignant neoplasm of upper-outer quadrant of left female breast: Secondary | ICD-10-CM | POA: Diagnosis not present

## 2020-02-17 DIAGNOSIS — Z171 Estrogen receptor negative status [ER-]: Secondary | ICD-10-CM | POA: Diagnosis not present

## 2020-02-17 MED ORDER — SONAFINE EX EMUL
1.0000 "application " | Freq: Two times a day (BID) | CUTANEOUS | Status: DC
  Administered 2020-02-17: 1 via TOPICAL

## 2020-02-17 MED ORDER — ALRA NON-METALLIC DEODORANT (RAD-ONC)
1.0000 "application " | Freq: Once | TOPICAL | Status: AC
  Administered 2020-02-17: 1 via TOPICAL

## 2020-02-17 NOTE — Progress Notes (Signed)
Pt here for patient teaching.  Went over material with the help of patient's daughter to translate.  Pt given Radiation and You booklet, skin care instructions, Alra deodorant and Sonafine.    Reviewed areas of pertinence such as fatigue, hair loss, skin changes, breast tenderness and breast swelling .   Pt able to give teach back of to pat skin, use unscented/gentle soap and drink plenty of water,apply Sonafine bid, avoid applying anything to skin within 4 hours of treatment, avoid wearing an under wire bra and to use an electric razor if they must shave.   Pt demonstrated understanding and verbalizes understanding of information given and will contact nursing with any questions or concerns.    [email or web address]

## 2020-02-18 ENCOUNTER — Other Ambulatory Visit: Payer: Self-pay

## 2020-02-18 ENCOUNTER — Ambulatory Visit
Admission: RE | Admit: 2020-02-18 | Discharge: 2020-02-18 | Disposition: A | Discharge status: Home / Self Care | Payer: BC Managed Care – PPO | Source: Ambulatory Visit | Attending: Radiation Oncology | Admitting: Radiation Oncology

## 2020-02-18 DIAGNOSIS — Z171 Estrogen receptor negative status [ER-]: Secondary | ICD-10-CM | POA: Diagnosis not present

## 2020-02-18 DIAGNOSIS — C50412 Malignant neoplasm of upper-outer quadrant of left female breast: Secondary | ICD-10-CM | POA: Diagnosis not present

## 2020-02-19 ENCOUNTER — Other Ambulatory Visit: Payer: Self-pay

## 2020-02-19 ENCOUNTER — Ambulatory Visit
Admission: RE | Admit: 2020-02-19 | Discharge: 2020-02-19 | Disposition: A | Discharge status: Home / Self Care | Payer: BC Managed Care – PPO | Source: Ambulatory Visit | Attending: Radiation Oncology | Admitting: Radiation Oncology

## 2020-02-19 DIAGNOSIS — Z171 Estrogen receptor negative status [ER-]: Secondary | ICD-10-CM | POA: Diagnosis not present

## 2020-02-19 DIAGNOSIS — C50412 Malignant neoplasm of upper-outer quadrant of left female breast: Secondary | ICD-10-CM | POA: Diagnosis not present

## 2020-02-20 ENCOUNTER — Ambulatory Visit
Admission: RE | Admit: 2020-02-20 | Discharge: 2020-02-20 | Disposition: A | Discharge status: Home / Self Care | Payer: BC Managed Care – PPO | Source: Ambulatory Visit | Attending: Radiation Oncology | Admitting: Radiation Oncology

## 2020-02-20 ENCOUNTER — Other Ambulatory Visit: Payer: Self-pay

## 2020-02-20 DIAGNOSIS — C50412 Malignant neoplasm of upper-outer quadrant of left female breast: Secondary | ICD-10-CM | POA: Diagnosis not present

## 2020-02-20 DIAGNOSIS — Z171 Estrogen receptor negative status [ER-]: Secondary | ICD-10-CM | POA: Diagnosis not present

## 2020-02-21 ENCOUNTER — Ambulatory Visit
Admission: RE | Admit: 2020-02-21 | Discharge: 2020-02-21 | Disposition: A | Discharge status: Home / Self Care | Payer: BC Managed Care – PPO | Source: Ambulatory Visit | Attending: Radiation Oncology | Admitting: Radiation Oncology

## 2020-02-21 ENCOUNTER — Ambulatory Visit (HOSPITAL_COMMUNITY)
Admission: RE | Admit: 2020-02-21 | Discharge: 2020-02-21 | Disposition: A | Discharge status: Home / Self Care | Payer: BC Managed Care – PPO | Source: Ambulatory Visit | Attending: Hematology and Oncology | Admitting: Hematology and Oncology

## 2020-02-21 ENCOUNTER — Other Ambulatory Visit: Payer: Self-pay

## 2020-02-21 DIAGNOSIS — I10 Essential (primary) hypertension: Secondary | ICD-10-CM | POA: Diagnosis not present

## 2020-02-21 DIAGNOSIS — Z01818 Encounter for other preprocedural examination: Secondary | ICD-10-CM | POA: Diagnosis not present

## 2020-02-21 DIAGNOSIS — Z171 Estrogen receptor negative status [ER-]: Secondary | ICD-10-CM | POA: Diagnosis not present

## 2020-02-21 DIAGNOSIS — C50412 Malignant neoplasm of upper-outer quadrant of left female breast: Secondary | ICD-10-CM | POA: Diagnosis not present

## 2020-02-21 NOTE — Progress Notes (Signed)
  Echocardiogram 2D Echocardiogram has been performed.  Dahlia Nifong A Doreena Maulden 02/21/2020, 10:57 AM

## 2020-02-24 ENCOUNTER — Other Ambulatory Visit: Payer: Self-pay

## 2020-02-24 ENCOUNTER — Ambulatory Visit
Admission: RE | Admit: 2020-02-24 | Discharge: 2020-02-24 | Disposition: A | Discharge status: Home / Self Care | Payer: BC Managed Care – PPO | Source: Ambulatory Visit | Attending: Radiation Oncology | Admitting: Radiation Oncology

## 2020-02-24 DIAGNOSIS — Z171 Estrogen receptor negative status [ER-]: Secondary | ICD-10-CM | POA: Diagnosis not present

## 2020-02-24 DIAGNOSIS — C50412 Malignant neoplasm of upper-outer quadrant of left female breast: Secondary | ICD-10-CM | POA: Diagnosis not present

## 2020-02-25 ENCOUNTER — Ambulatory Visit
Admission: RE | Admit: 2020-02-25 | Discharge: 2020-02-25 | Disposition: A | Discharge status: Home / Self Care | Payer: BC Managed Care – PPO | Source: Ambulatory Visit | Attending: Radiation Oncology | Admitting: Radiation Oncology

## 2020-02-25 ENCOUNTER — Other Ambulatory Visit: Payer: Self-pay

## 2020-02-25 DIAGNOSIS — Z171 Estrogen receptor negative status [ER-]: Secondary | ICD-10-CM | POA: Diagnosis not present

## 2020-02-25 DIAGNOSIS — C50412 Malignant neoplasm of upper-outer quadrant of left female breast: Secondary | ICD-10-CM | POA: Diagnosis not present

## 2020-02-26 ENCOUNTER — Ambulatory Visit
Admission: RE | Admit: 2020-02-26 | Discharge: 2020-02-26 | Disposition: A | Discharge status: Home / Self Care | Payer: BC Managed Care – PPO | Source: Ambulatory Visit | Attending: Radiation Oncology | Admitting: Radiation Oncology

## 2020-02-26 ENCOUNTER — Other Ambulatory Visit: Payer: Self-pay

## 2020-02-26 DIAGNOSIS — C50412 Malignant neoplasm of upper-outer quadrant of left female breast: Secondary | ICD-10-CM | POA: Diagnosis not present

## 2020-02-26 DIAGNOSIS — Z171 Estrogen receptor negative status [ER-]: Secondary | ICD-10-CM | POA: Diagnosis not present

## 2020-02-27 ENCOUNTER — Ambulatory Visit
Admission: RE | Admit: 2020-02-27 | Discharge: 2020-02-27 | Disposition: A | Discharge status: Home / Self Care | Payer: BC Managed Care – PPO | Source: Ambulatory Visit | Attending: Radiation Oncology | Admitting: Radiation Oncology

## 2020-02-27 ENCOUNTER — Other Ambulatory Visit: Payer: Self-pay

## 2020-02-27 DIAGNOSIS — Z171 Estrogen receptor negative status [ER-]: Secondary | ICD-10-CM | POA: Diagnosis not present

## 2020-02-27 DIAGNOSIS — C50412 Malignant neoplasm of upper-outer quadrant of left female breast: Secondary | ICD-10-CM | POA: Diagnosis not present

## 2020-02-28 ENCOUNTER — Other Ambulatory Visit: Payer: Self-pay

## 2020-02-28 ENCOUNTER — Ambulatory Visit
Admission: RE | Admit: 2020-02-28 | Discharge: 2020-02-28 | Disposition: A | Discharge status: Home / Self Care | Payer: BC Managed Care – PPO | Source: Ambulatory Visit | Attending: Radiation Oncology | Admitting: Radiation Oncology

## 2020-02-28 DIAGNOSIS — C50412 Malignant neoplasm of upper-outer quadrant of left female breast: Secondary | ICD-10-CM | POA: Diagnosis not present

## 2020-02-28 DIAGNOSIS — Z171 Estrogen receptor negative status [ER-]: Secondary | ICD-10-CM | POA: Diagnosis not present

## 2020-02-29 ENCOUNTER — Encounter (HOSPITAL_COMMUNITY): Payer: Self-pay | Admitting: Gynecology

## 2020-02-29 ENCOUNTER — Ambulatory Visit (HOSPITAL_COMMUNITY)
Admission: EM | Admit: 2020-02-29 | Discharge: 2020-02-29 | Disposition: A | Discharge status: Home / Self Care | Payer: BC Managed Care – PPO | Attending: Urgent Care | Admitting: Urgent Care

## 2020-02-29 ENCOUNTER — Other Ambulatory Visit: Payer: Self-pay

## 2020-02-29 DIAGNOSIS — J4540 Moderate persistent asthma, uncomplicated: Secondary | ICD-10-CM

## 2020-02-29 DIAGNOSIS — R070 Pain in throat: Secondary | ICD-10-CM

## 2020-02-29 DIAGNOSIS — R059 Cough, unspecified: Secondary | ICD-10-CM

## 2020-02-29 DIAGNOSIS — R05 Cough: Secondary | ICD-10-CM

## 2020-02-29 MED ORDER — PROMETHAZINE-DM 6.25-15 MG/5ML PO SYRP
5.0000 mL | ORAL_SOLUTION | Freq: Every evening | ORAL | 0 refills | Status: DC | PRN
Start: 2020-02-29 — End: 2020-08-19

## 2020-02-29 MED ORDER — MONTELUKAST SODIUM 10 MG PO TABS: 10.0000 mg | ORAL_TABLET | Freq: Every day | ORAL | 0 refills | Status: DC

## 2020-02-29 MED ORDER — PREDNISONE 20 MG PO TABS
ORAL_TABLET | ORAL | 0 refills | Status: DC
Start: 2020-02-29 — End: 2020-08-19

## 2020-02-29 MED ORDER — BENZONATATE 100 MG PO CAPS: 100.0000 mg | ORAL_CAPSULE | Freq: Three times a day (TID) | ORAL | 0 refills | Status: DC | PRN

## 2020-02-29 NOTE — ED Triage Notes (Signed)
Patient c/o dry cough x 1 week. Patient stated her albuterol inhaler help but she need refill.

## 2020-02-29 NOTE — ED Provider Notes (Signed)
[name]   MRN: [id number] DOB: 07/17/1966  Subjective:   Veronica Velazquez is a 54 y.o. female presenting for 1 week history of persistent cough with wheezing.  Patient's symptoms started with sinus congestion, throat pain, productive cough.  Symptoms have improved but her cough is really bothersome now and is mostly dry.  She has been using her albuterol consistently over the past week with temporary relief.  Denies smoking cigarettes.  Patient has not been vaccinated for COVID-19.  She is refusing testing today.  Denies fever, chest pain, belly pain.  Patient would also like a refill of cough medication she has previously had this is helped her before.  No current facility-administered medications for this encounter.  Current Outpatient Medications:  .  albuterol (PROVENTIL HFA;VENTOLIN HFA) 108 (90 Base) MCG/ACT inhaler, Inhale 2 puffs into the lungs every 3 (three) hours as needed for wheezing (cough, shortness of breath or wheezing.)., Disp: 1 Inhaler, Rfl: 3 .  atenolol-chlorthalidone (TENORETIC) 100-25 MG tablet, Take 0.5 tablets by mouth daily., Disp: 45 tablet, Rfl: 0 .  ibuprofen (ADVIL) 800 MG tablet, Take 1 tablet (800 mg total) by mouth every 8 (eight) hours as needed., Disp: 30 tablet, Rfl: 0 .  levothyroxine (SYNTHROID) 100 MCG tablet, Take 1 tablet (100 mcg total) by mouth daily., Disp: 30 tablet, Rfl: 1 .  lisinopril (ZESTRIL) 20 MG tablet, Take 1 tablet (20 mg total) by mouth daily., Disp: 90 tablet, Rfl: 0 .  methocarbamol (ROBAXIN) 500 MG tablet, Take 1 tablet (500 mg total) by mouth 4 (four) times daily., Disp: 30 tablet, Rfl: 0 .  simvastatin (ZOCOR) 20 MG tablet, Take 1 tablet (20 mg total) by mouth daily., Disp: 90 tablet, Rfl: 3 .  traMADol (ULTRAM) 50 MG tablet, Take 1 tablet (50 mg total) by mouth every 6 (six) hours as needed for moderate pain or severe pain., Disp: 10 tablet, Rfl: 0 .  traMADol (ULTRAM) 50 MG tablet, Take 1 tablet (50 mg total) by  mouth every 6 (six) hours as needed., Disp: 20 tablet, Rfl: 0   No Known Allergies  Past Medical History:  Diagnosis Date  . Asthma   . Breast mass, left 08/13/2019  . Cancer ([place]) 09/2019   left breast cancer  . Hyperglycemia   . Hypertension   . Hypothyroidism   . Microcytosis   . Obesity   . Personal history of chemotherapy   . Pre-diabetes   . Prediabetes   . Trapezius muscle spasm 08/09/2019     Past Surgical History:  Procedure Laterality Date  . ABDOMINAL HYSTERECTOMY    . BREAST LUMPECTOMY WITH RADIOACTIVE SEED AND SENTINEL LYMPH NODE BIOPSY Left 01/07/2020   Procedure: LEFT BREAST LUMPECTOMY WITH RADIOACTIVE SEED AND LEFT AXILLARY SENTINEL LYMPH NODE BIOPSY;  Surgeon: Alphonsa Overall, MD;  Location: [facility];  Service: General;  Laterality: Left;  . PORTACATH PLACEMENT Right 10/15/2019   Procedure: INSERTION PORT-A-CATH WITH ULTRASOUND;  Surgeon: Alphonsa Overall, MD;  Location: [facility];  Service: General;  Laterality: Right;    Family History  Problem Relation Age of Onset  . Diabetes Brother        drug-induced    Social History   Tobacco Use  . Smoking status: Never Smoker  . Smokeless tobacco: Never Used  Substance Use Topics  . Alcohol use: No  . Drug use: No    ROS   Objective:   Vitals: BP 127/79 (BP Location: Left Arm)   Pulse 70  Temp 99 F (37.2 C) (Oral)   Resp 18   Ht 5\' 4"  (1.626 m)   SpO2 97%   BMI 43.34 kg/m   Physical Exam Constitutional:      General: She is not in acute distress.    Appearance: Normal appearance. She is well-developed. She is not ill-appearing, toxic-appearing or diaphoretic.  HENT:     Head: Normocephalic and atraumatic.     Right Ear: Tympanic membrane and ear canal normal. No drainage or tenderness. No middle ear effusion. Tympanic membrane is not erythematous.     Left Ear: Tympanic membrane and ear canal normal. No drainage or tenderness.  No middle ear effusion. Tympanic  membrane is not erythematous.     Nose: Nose normal. No congestion or rhinorrhea.     Mouth/Throat:     Mouth: Mucous membranes are moist. No oral lesions.     Pharynx: No pharyngeal swelling, oropharyngeal exudate, posterior oropharyngeal erythema or uvula swelling.     Tonsils: No tonsillar exudate or tonsillar abscesses.  Eyes:     General: No scleral icterus.       Right eye: No discharge.        Left eye: No discharge.     Extraocular Movements: Extraocular movements intact.     Right eye: Normal extraocular motion.     Left eye: Normal extraocular motion.     Conjunctiva/sclera: Conjunctivae normal.     Pupils: Pupils are equal, round, and reactive to light.  Cardiovascular:     Rate and Rhythm: Normal rate and regular rhythm.     Pulses: Normal pulses.     Heart sounds: Normal heart sounds. No murmur. No friction rub. No gallop.   Pulmonary:     Effort: Pulmonary effort is normal. No respiratory distress.     Breath sounds: No stridor. Wheezing (mild over mid lung fields bilaterally) present. No rhonchi or rales.  Musculoskeletal:     Cervical back: Normal range of motion and neck supple.  Lymphadenopathy:     Cervical: No cervical adenopathy.  Skin:    General: Skin is warm and dry.     Findings: No rash.  Neurological:     General: No focal deficit present.     Mental Status: She is alert and oriented to person, place, and time.  Psychiatric:        Mood and Affect: Mood normal.        Behavior: Behavior normal.        Thought Content: Thought content normal.        Judgment: Judgment normal.      Assessment and Plan :   PDMP not reviewed this encounter.  1. Cough   2. Throat pain   3. Moderate persistent asthma without complication     Start prednisone course to address her asthma.  Counseled that she likely started with a viral illness and has persisted due to her asthma.  Patient refused COVID-19 testing but recommended recheck if her symptoms persist.   They were receptive to this.  Use supportive care otherwise.  Vital signs stable, physical exam findings reassuring for outpatient management. Counseled patient on potential for adverse effects with medications prescribed/recommended today, ER and return-to-clinic precautions discussed, patient verbalized understanding.    [name], [place] 03/01/20 1015

## 2020-03-01 ENCOUNTER — Telehealth (HOSPITAL_COMMUNITY): Payer: Self-pay | Admitting: Urgent Care

## 2020-03-01 DIAGNOSIS — J4521 Mild intermittent asthma with (acute) exacerbation: Secondary | ICD-10-CM

## 2020-03-01 MED ORDER — ALBUTEROL SULFATE HFA 108 (90 BASE) MCG/ACT IN AERS: 1.0000 | INHALATION_SPRAY | RESPIRATORY_TRACT | 0 refills | Status: DC | PRN

## 2020-03-01 NOTE — Telephone Encounter (Signed)
Patient called and requested refill of her albuterol. Script sent.

## 2020-03-02 ENCOUNTER — Ambulatory Visit
Admission: RE | Admit: 2020-03-02 | Discharge: 2020-03-02 | Disposition: A | Discharge status: Home / Self Care | Payer: BC Managed Care – PPO | Source: Ambulatory Visit | Attending: Radiation Oncology | Admitting: Radiation Oncology

## 2020-03-02 ENCOUNTER — Ambulatory Visit: Payer: BC Managed Care – PPO | Admitting: Radiation Oncology

## 2020-03-02 ENCOUNTER — Other Ambulatory Visit: Payer: Self-pay

## 2020-03-02 DIAGNOSIS — C50412 Malignant neoplasm of upper-outer quadrant of left female breast: Secondary | ICD-10-CM | POA: Diagnosis not present

## 2020-03-02 DIAGNOSIS — Z171 Estrogen receptor negative status [ER-]: Secondary | ICD-10-CM | POA: Diagnosis not present

## 2020-03-03 ENCOUNTER — Ambulatory Visit
Admission: RE | Admit: 2020-03-03 | Discharge: 2020-03-03 | Disposition: A | Discharge status: Home / Self Care | Payer: BC Managed Care – PPO | Source: Ambulatory Visit | Attending: Radiation Oncology | Admitting: Radiation Oncology

## 2020-03-03 ENCOUNTER — Other Ambulatory Visit: Payer: Self-pay

## 2020-03-03 DIAGNOSIS — C50412 Malignant neoplasm of upper-outer quadrant of left female breast: Secondary | ICD-10-CM | POA: Diagnosis not present

## 2020-03-03 DIAGNOSIS — Z171 Estrogen receptor negative status [ER-]: Secondary | ICD-10-CM | POA: Diagnosis not present

## 2020-03-03 NOTE — Progress Notes (Signed)
Patient Care Team: Horald Pollen, MD as PCP - General (Internal Medicine) Mauro Kaufmann, RN as Oncology Nurse Navigator Rockwell Germany, RN as Oncology Nurse Navigator Nicholas Lose, MD as Consulting Physician (Hematology and Oncology) Eppie Gibson, MD as Attending Physician (Radiation Oncology) Alphonsa Overall, MD as Consulting Physician (General Surgery)  DIAGNOSIS:    ICD-10-CM   1. Malignant neoplasm of upper-outer quadrant of left breast in female, estrogen receptor negative ([place])  C50.412    Z17.1     SUMMARY OF ONCOLOGIC HISTORY: Oncology History  Malignant neoplasm of upper-outer quadrant of left breast in female, estrogen receptor negative ([place])  09/30/2019 Initial Diagnosis   Patient palpated left breast mass. Mammogram showed a 2.3cm mass at the 2 o'clock position, normal-appearing axillary lymph nodes. Biopsy showed IDC, grade 3, HER-2 + (3+), ER/PR -, Ki67 40%.   10/02/2019 Cancer Staging   Staging form: Breast, AJCC 8th Edition - Clinical stage from 10/02/2019: Stage IIA (cT2, cN0, cM0, G3, ER-, PR-, HER2+) - Signed by Nicholas Lose, MD on 10/02/2019   10/16/2019 - 12/10/2019 Chemotherapy   Taxol Herceptin neoadjuvant chemotherapy x5 (stopped early due to neuropathy toxicity)    Genetic Testing   Negative genetic testing. No pathogenic variants identified on the [facility] Common Hereditary Cancers Panel. The report date is 11/26/2019.  The Common Hereditary Cancers Panel offered by [facility] includes sequencing and/or deletion duplication testing of the following 48 genes: APC, ATM, AXIN2, BARD1, BMPR1A, BRCA1, BRCA2, BRIP1, CDH1, CDKN2A (p14ARF), CDKN2A (p16INK4a), CKD4, CHEK2, CTNNA1, DICER1, EPCAM (Deletion/duplication testing only), GREM1 (promoter region deletion/duplication testing only), KIT, MEN1, MLH1, MSH2, MSH3, MSH6, MUTYH, NBN, NF1, NHTL1, PALB2, PDGFRA, PMS2, POLD1, POLE, PTEN, RAD50, RAD51C, RAD51D, RNF43, SDHB, SDHC, SDHD, SMAD4, SMARCA4. STK11, TP53,  TSC1, TSC2, and VHL.  The following genes were evaluated for sequence changes only: SDHA and HOXB13 c.251G>A variant only.   12/11/2019 -  Chemotherapy   Herceptin maintenance    01/07/2020 Surgery   Left lumpectomy Lucia Gaskins): no residual carcinoma, clear margins, 3 left axillary lymph nodes negative.   02/18/2020 -  Radiation Therapy   Adjuvant radiation     CHIEF COMPLIANT: Herceptin maintenance   INTERVAL HISTORY: Veronica Velazquez is a 54 y.o. with above-mentioned history of left breast cancer who completedneoadjuvant chemotherapy, underwent a left lumpectomy, and is currently on adjuvant Herceptin maintenance and radiation therapy.She presents to the clinic today for treatment.  She feels dizzy when she goes to radiation.  Denies any other trouble with Herceptin.  ALLERGIES:  has No Known Allergies.  MEDICATIONS:  Current Outpatient Medications  Medication Sig Dispense Refill  . albuterol (VENTOLIN HFA) 108 (90 Base) MCG/ACT inhaler Inhale 1-2 puffs into the lungs every 4 (four) hours as needed for wheezing or shortness of breath (cough, shortness of breath or wheezing.). 18 g 0  . atenolol-chlorthalidone (TENORETIC) 100-25 MG tablet Take 0.5 tablets by mouth daily. 45 tablet 0  . benzonatate (TESSALON) 100 MG capsule Take 1-2 capsules (100-200 mg total) by mouth 3 (three) times daily as needed. 60 capsule 0  . ibuprofen (ADVIL) 800 MG tablet Take 1 tablet (800 mg total) by mouth every 8 (eight) hours as needed. 30 tablet 0  . levothyroxine (SYNTHROID) 100 MCG tablet Take 1 tablet (100 mcg total) by mouth daily. 30 tablet 1  . lisinopril (ZESTRIL) 20 MG tablet Take 1 tablet (20 mg total) by mouth daily. 90 tablet 0  . methocarbamol (ROBAXIN) 500 MG tablet Take 1 tablet (500 mg total) by  mouth 4 (four) times daily. 30 tablet 0  . montelukast (SINGULAIR) 10 MG tablet Take 1 tablet (10 mg total) by mouth at bedtime. 90 tablet 0  . predniSONE (DELTASONE) 20 MG tablet Take 2 tablets  daily with breakfast. 10 tablet 0  . promethazine-dextromethorphan (PROMETHAZINE-DM) 6.25-15 MG/5ML syrup Take 5 mLs by mouth at bedtime as needed for cough. 100 mL 0  . simvastatin (ZOCOR) 20 MG tablet Take 1 tablet (20 mg total) by mouth daily. 90 tablet 3  . traMADol (ULTRAM) 50 MG tablet Take 1 tablet (50 mg total) by mouth every 6 (six) hours as needed for moderate pain or severe pain. 10 tablet 0  . traMADol (ULTRAM) 50 MG tablet Take 1 tablet (50 mg total) by mouth every 6 (six) hours as needed. 20 tablet 0   No current facility-administered medications for this visit.    PHYSICAL EXAMINATION: ECOG PERFORMANCE STATUS: 1 - Symptomatic but completely ambulatory  Vitals:   03/04/20 0918  BP: (!) 142/94  Pulse: 66  Resp: 17  Temp: 98.7 F (37.1 C)  SpO2: 99%   Filed Weights   03/04/20 0918  Weight: 250 lb 6.4 oz (113.6 kg)    LABORATORY DATA:  I have reviewed the data as listed CMP Latest Ref Rng & Units 02/12/2020 01/22/2020 01/03/2020  Glucose 70 - 99 mg/dL 145(H) 132(H) 198(H)  BUN 6 - 20 mg/dL _0 Creatinine 0.44 - 1.00 mg/dL 0.75 0.73 0.74  Sodium 135 - 145 mmol/L 137 139 135  Potassium 3.5 - 5.1 mmol/L 3.9 4.1 3.9  Chloride 98 - 111 mmol/L 100 104 99  CO2 22 - 32 mmol/L _1 Calcium 8.9 - 10.3 mg/dL 9.7 9.4 9.6  Total Protein 6.5 - 8.1 g/dL 7.6 7.3 -  Total Bilirubin 0.3 - 1.2 mg/dL 0.4 0.3 -  Alkaline Phos 38 - 126 U/L 122 111 -  AST 15 - 41 U/L 18 14(L) -  ALT 0 - 44 U/L 23 17 -    Lab Results  Component Value Date   WBC 10.2 02/12/2020   HGB 10.8 (L) 02/12/2020   HCT 35.4 (L) 02/12/2020   MCV 76.5 (L) 02/12/2020   PLT 270 02/12/2020   NEUTROABS 6.5 02/12/2020    ASSESSMENT & PLAN:  Malignant neoplasm of upper-outer quadrant of left breast in female, estrogen receptor negative ([place]) 09/30/2019:Patient palpated left breast mass. Mammogram showed a 2.3cm mass at the 2 o'clock position, normal-appearing axillary lymph nodes. Biopsy showed IDC,  grade 3, HER-2 + (3+), ER/PR -, Ki67 40%. T2 N0 stage IIa clinical stage  Treatment plan: 1.Neoadjuvant chemotherapy with Taxol-Herceptin weekly X5(neuropathy caused discontinuation of Taxol) followed by Herceptin maintenance for 1 year   2.Breast conserving surgery with sentinel lymph node biopsy (Dr.Newman) 01/07/20: Path CR, 0/3 LN 3.Adjuvant radiation ----------------------------------------------------------------------------------------------------------------------------------------------------- Plan: continue maintenance Herceptin q 3 weeks. Referral for adj XRT No role of anti-estrogen therapy  Return to clinic every 3 weeks for Herceptin every 6 weeks to follow-up with me.    No orders of the defined types were placed in this encounter.  The patient has a good understanding of the overall plan. she agrees with it. she will call with any problems that may develop before the next visit here.  Total time spent: 30 mins including face to face time and time spent for planning, charting and coordination of care  Nicholas Lose, MD 03/04/2020  I, Cloyde Reams Dorshimer, am acting as scribe for Dr. Nicholas Lose.  I  have reviewed the above documentation for accuracy and completeness, and I agree with the above.

## 2020-03-04 ENCOUNTER — Other Ambulatory Visit: Payer: BC Managed Care – PPO

## 2020-03-04 ENCOUNTER — Ambulatory Visit
Admission: RE | Admit: 2020-03-04 | Discharge: 2020-03-04 | Disposition: A | Discharge status: Home / Self Care | Payer: BC Managed Care – PPO | Source: Ambulatory Visit | Attending: Radiation Oncology | Admitting: Radiation Oncology

## 2020-03-04 ENCOUNTER — Ambulatory Visit: Payer: BC Managed Care – PPO

## 2020-03-04 ENCOUNTER — Other Ambulatory Visit: Payer: Self-pay

## 2020-03-04 ENCOUNTER — Inpatient Hospital Stay: Payer: BC Managed Care – PPO

## 2020-03-04 ENCOUNTER — Ambulatory Visit: Payer: BC Managed Care – PPO | Admitting: Hematology and Oncology

## 2020-03-04 ENCOUNTER — Inpatient Hospital Stay (HOSPITAL_BASED_OUTPATIENT_CLINIC_OR_DEPARTMENT_OTHER): Payer: BC Managed Care – PPO | Admitting: Hematology and Oncology

## 2020-03-04 VITALS — BP 134/75

## 2020-03-04 DIAGNOSIS — Z171 Estrogen receptor negative status [ER-]: Secondary | ICD-10-CM

## 2020-03-04 DIAGNOSIS — C50412 Malignant neoplasm of upper-outer quadrant of left female breast: Secondary | ICD-10-CM | POA: Diagnosis not present

## 2020-03-04 DIAGNOSIS — Z5112 Encounter for antineoplastic immunotherapy: Secondary | ICD-10-CM | POA: Diagnosis not present

## 2020-03-04 MED ORDER — DIPHENHYDRAMINE HCL 25 MG PO CAPS
ORAL_CAPSULE | ORAL | Status: AC
  Filled 2020-03-04: qty 1

## 2020-03-04 MED ORDER — SODIUM CHLORIDE 0.9 % IV SOLN
Freq: Once | INTRAVENOUS | Status: AC
  Filled 2020-03-04: qty 250

## 2020-03-04 MED ORDER — DIPHENHYDRAMINE HCL 25 MG PO CAPS
25.0000 mg | ORAL_CAPSULE | Freq: Once | ORAL | Status: AC
  Administered 2020-03-04: 25 mg via ORAL

## 2020-03-04 MED ORDER — SODIUM CHLORIDE 0.9% FLUSH
10.0000 mL | INTRAVENOUS | Status: DC | PRN
  Administered 2020-03-04: 10 mL
  Filled 2020-03-04: qty 10

## 2020-03-04 MED ORDER — HEPARIN SOD (PORK) LOCK FLUSH 100 UNIT/ML IV SOLN
500.0000 [IU] | Freq: Once | INTRAVENOUS | Status: AC | PRN
  Administered 2020-03-04: 500 [IU]
  Filled 2020-03-04: qty 5

## 2020-03-04 MED ORDER — ACETAMINOPHEN 325 MG PO TABS
ORAL_TABLET | ORAL | Status: AC
  Filled 2020-03-04: qty 2

## 2020-03-04 MED ORDER — ACETAMINOPHEN 325 MG PO TABS
650.0000 mg | ORAL_TABLET | Freq: Once | ORAL | Status: AC
  Administered 2020-03-04: 650 mg via ORAL

## 2020-03-04 MED ORDER — TRASTUZUMAB-DKST CHEMO 150 MG IV SOLR
6.0000 mg/kg | Freq: Once | INTRAVENOUS | Status: AC
  Administered 2020-03-04: 672 mg via INTRAVENOUS
  Filled 2020-03-04: qty 32

## 2020-03-04 NOTE — Assessment & Plan Note (Signed)
09/30/2019:Patient palpated left breast mass. Mammogram showed a 2.3cm mass at the 2 o'clock position, normal-appearing axillary lymph nodes. Biopsy showed IDC, grade 3, HER-2 + (3+), ER/PR -, Ki67 40%. T2 N0 stage IIa clinical stage  Treatment plan: 1.Neoadjuvant chemotherapy with Taxol-Herceptin weekly X5(neuropathy caused discontinuation of Taxol) followed by Herceptin maintenance for 1 year   2.Breast conserving surgery with sentinel lymph node biopsy (Dr.Newman) 01/07/20: Path CR, 0/3 LN 3.Adjuvant radiation ----------------------------------------------------------------------------------------------------------------------------------------------------- Plan: continue maintenance Herceptin q 3 weeks. Referral for adj XRT No role of anti-estrogen therapy  Return to clinic every 3 weeks for Herceptin every 6 weeks to follow-up with me.

## 2020-03-04 NOTE — Patient Instructions (Signed)
[facility] Discharge Instructions for Patients Receiving Chemotherapy  Today you received the following chemotherapy agents Trastuzumab (OGIVRI).  To help prevent nausea and vomiting after your treatment, we encourage you to take your nausea medication as prescribed.   If you develop nausea and vomiting that is not controlled by your nausea medication, call the clinic.   BELOW ARE SYMPTOMS THAT SHOULD BE REPORTED IMMEDIATELY:  *FEVER GREATER THAN 100.5 F  *CHILLS WITH OR WITHOUT FEVER  NAUSEA AND VOMITING THAT IS NOT CONTROLLED WITH YOUR NAUSEA MEDICATION  *UNUSUAL SHORTNESS OF BREATH  *UNUSUAL BRUISING OR BLEEDING  TENDERNESS IN MOUTH AND THROAT WITH OR WITHOUT PRESENCE OF ULCERS  *URINARY PROBLEMS  *BOWEL PROBLEMS  UNUSUAL RASH Items with * indicate a potential emergency and should be followed up as soon as possible.  Feel free to call the clinic should you have any questions or concerns. The clinic phone number is [phone].  Please show the CHEMO ALERT CARD at check-in to the Emergency Department and triage nurse.   

## 2020-03-05 ENCOUNTER — Ambulatory Visit
Admission: RE | Admit: 2020-03-05 | Discharge: 2020-03-05 | Disposition: A | Discharge status: Home / Self Care | Payer: BC Managed Care – PPO | Source: Ambulatory Visit | Attending: Radiation Oncology | Admitting: Radiation Oncology

## 2020-03-05 DIAGNOSIS — C50412 Malignant neoplasm of upper-outer quadrant of left female breast: Secondary | ICD-10-CM | POA: Diagnosis not present

## 2020-03-05 DIAGNOSIS — Z171 Estrogen receptor negative status [ER-]: Secondary | ICD-10-CM | POA: Diagnosis not present

## 2020-03-06 ENCOUNTER — Ambulatory Visit
Admission: RE | Admit: 2020-03-06 | Discharge: 2020-03-06 | Disposition: A | Discharge status: Home / Self Care | Payer: BC Managed Care – PPO | Source: Ambulatory Visit | Attending: Radiation Oncology | Admitting: Radiation Oncology

## 2020-03-06 ENCOUNTER — Other Ambulatory Visit: Payer: Self-pay

## 2020-03-06 DIAGNOSIS — Z171 Estrogen receptor negative status [ER-]: Secondary | ICD-10-CM | POA: Diagnosis not present

## 2020-03-06 DIAGNOSIS — C50412 Malignant neoplasm of upper-outer quadrant of left female breast: Secondary | ICD-10-CM | POA: Diagnosis not present

## 2020-03-10 ENCOUNTER — Ambulatory Visit
Admission: RE | Admit: 2020-03-10 | Discharge: 2020-03-10 | Disposition: A | Discharge status: Home / Self Care | Payer: BC Managed Care – PPO | Source: Ambulatory Visit | Attending: Radiation Oncology | Admitting: Radiation Oncology

## 2020-03-10 ENCOUNTER — Other Ambulatory Visit: Payer: Self-pay

## 2020-03-10 DIAGNOSIS — Z171 Estrogen receptor negative status [ER-]: Secondary | ICD-10-CM | POA: Diagnosis not present

## 2020-03-10 DIAGNOSIS — C50412 Malignant neoplasm of upper-outer quadrant of left female breast: Secondary | ICD-10-CM | POA: Diagnosis not present

## 2020-03-11 ENCOUNTER — Other Ambulatory Visit: Payer: Self-pay

## 2020-03-11 ENCOUNTER — Ambulatory Visit
Admission: RE | Admit: 2020-03-11 | Discharge: 2020-03-11 | Disposition: A | Discharge status: Home / Self Care | Payer: BC Managed Care – PPO | Source: Ambulatory Visit | Attending: Radiation Oncology | Admitting: Radiation Oncology

## 2020-03-11 DIAGNOSIS — Z171 Estrogen receptor negative status [ER-]: Secondary | ICD-10-CM | POA: Diagnosis not present

## 2020-03-11 DIAGNOSIS — C50412 Malignant neoplasm of upper-outer quadrant of left female breast: Secondary | ICD-10-CM | POA: Diagnosis not present

## 2020-03-12 ENCOUNTER — Ambulatory Visit
Admission: RE | Admit: 2020-03-12 | Discharge: 2020-03-12 | Disposition: A | Discharge status: Home / Self Care | Payer: BC Managed Care – PPO | Source: Ambulatory Visit | Attending: Radiation Oncology | Admitting: Radiation Oncology

## 2020-03-12 DIAGNOSIS — Z171 Estrogen receptor negative status [ER-]: Secondary | ICD-10-CM | POA: Diagnosis not present

## 2020-03-12 DIAGNOSIS — C50412 Malignant neoplasm of upper-outer quadrant of left female breast: Secondary | ICD-10-CM | POA: Diagnosis not present

## 2020-03-13 ENCOUNTER — Other Ambulatory Visit: Payer: Self-pay

## 2020-03-13 ENCOUNTER — Ambulatory Visit
Admission: RE | Admit: 2020-03-13 | Discharge: 2020-03-13 | Disposition: A | Discharge status: Home / Self Care | Payer: BC Managed Care – PPO | Source: Ambulatory Visit | Attending: Radiation Oncology | Admitting: Radiation Oncology

## 2020-03-13 DIAGNOSIS — Z171 Estrogen receptor negative status [ER-]: Secondary | ICD-10-CM | POA: Diagnosis not present

## 2020-03-13 DIAGNOSIS — I10 Essential (primary) hypertension: Secondary | ICD-10-CM

## 2020-03-13 DIAGNOSIS — C50412 Malignant neoplasm of upper-outer quadrant of left female breast: Secondary | ICD-10-CM | POA: Diagnosis not present

## 2020-03-13 MED ORDER — LISINOPRIL 20 MG PO TABS: 20.0000 mg | ORAL_TABLET | Freq: Every day | ORAL | 0 refills | Status: DC

## 2020-03-16 ENCOUNTER — Ambulatory Visit
Admission: RE | Admit: 2020-03-16 | Discharge: 2020-03-16 | Disposition: A | Discharge status: Home / Self Care | Payer: BC Managed Care – PPO | Source: Ambulatory Visit | Attending: Radiation Oncology | Admitting: Radiation Oncology

## 2020-03-16 ENCOUNTER — Other Ambulatory Visit: Payer: Self-pay

## 2020-03-16 ENCOUNTER — Encounter: Payer: Self-pay | Admitting: Radiation Oncology

## 2020-03-16 ENCOUNTER — Encounter: Payer: Self-pay | Admitting: *Deleted

## 2020-03-16 DIAGNOSIS — Z171 Estrogen receptor negative status [ER-]: Secondary | ICD-10-CM | POA: Diagnosis not present

## 2020-03-16 DIAGNOSIS — C50412 Malignant neoplasm of upper-outer quadrant of left female breast: Secondary | ICD-10-CM | POA: Diagnosis not present

## 2020-03-24 ENCOUNTER — Other Ambulatory Visit: Payer: Self-pay | Admitting: Registered Nurse

## 2020-03-24 ENCOUNTER — Telehealth: Payer: Self-pay | Admitting: *Deleted

## 2020-03-24 DIAGNOSIS — R7989 Other specified abnormal findings of blood chemistry: Secondary | ICD-10-CM

## 2020-03-24 MED ORDER — LEVOTHYROXINE SODIUM 100 MCG PO TABS: 100.0000 ug | ORAL_TABLET | Freq: Every day | ORAL | 0 refills | Status: DC

## 2020-03-24 NOTE — Telephone Encounter (Signed)
Spoke to [employer] NP about patient's medication levothyroxine 100 mcg refill request and he e-scribed  60 tablets with no refills. Patient will need to schedule an appointment for labs and additional refills with Rich or Dr Mitchel Honour.

## 2020-03-25 ENCOUNTER — Inpatient Hospital Stay: Payer: BC Managed Care – PPO

## 2020-03-25 ENCOUNTER — Other Ambulatory Visit: Payer: BC Managed Care – PPO

## 2020-03-25 ENCOUNTER — Other Ambulatory Visit: Payer: Self-pay

## 2020-03-25 ENCOUNTER — Ambulatory Visit: Payer: BC Managed Care – PPO

## 2020-03-25 ENCOUNTER — Inpatient Hospital Stay: Payer: BC Managed Care – PPO | Attending: Hematology and Oncology

## 2020-03-25 VITALS — BP 131/82 | HR 65 | Temp 97.9°F | Resp 18 | Wt 252.0 lb

## 2020-03-25 DIAGNOSIS — C50412 Malignant neoplasm of upper-outer quadrant of left female breast: Secondary | ICD-10-CM | POA: Insufficient documentation

## 2020-03-25 DIAGNOSIS — Z171 Estrogen receptor negative status [ER-]: Secondary | ICD-10-CM | POA: Diagnosis not present

## 2020-03-25 DIAGNOSIS — Z79899 Other long term (current) drug therapy: Secondary | ICD-10-CM | POA: Diagnosis not present

## 2020-03-25 DIAGNOSIS — Z5112 Encounter for antineoplastic immunotherapy: Secondary | ICD-10-CM | POA: Diagnosis not present

## 2020-03-25 DIAGNOSIS — Z95828 Presence of other vascular implants and grafts: Secondary | ICD-10-CM

## 2020-03-25 LAB — CMP (CANCER CENTER ONLY)
ALT: 22 U/L (ref 0–44)
AST: 20 U/L (ref 15–41)
Albumin: 3.2 g/dL — ABNORMAL LOW (ref 3.5–5.0)
Alkaline Phosphatase: 104 U/L (ref 38–126)
Anion gap: 11 (ref 5–15)
BUN: 14 mg/dL (ref 6–20)
CO2: 25 mmol/L (ref 22–32)
Calcium: 9.5 mg/dL (ref 8.9–10.3)
Chloride: 102 mmol/L (ref 98–111)
Creatinine: 0.76 mg/dL (ref 0.44–1.00)
GFR, Est AFR Am: >60 mL/min (ref >60)
GFR, Estimated: >60 mL/min (ref >60)
Glucose, Bld: 155 mg/dL — ABNORMAL HIGH (ref 70–99)
Potassium: 3.7 mmol/L (ref 3.5–5.1)
Sodium: 138 mmol/L (ref 135–145)
Total Bilirubin: 0.4 mg/dL (ref 0.3–1.2)
Total Protein: 7.3 g/dL (ref 6.5–8.1)

## 2020-03-25 LAB — CBC WITH DIFFERENTIAL (CANCER CENTER ONLY)
Abs Immature Granulocytes: 0.02 10*3/uL (ref 0.00–0.07)
Basophils Absolute: 0 10*3/uL (ref 0.0–0.1)
Basophils Relative: 0 %
Eosinophils Absolute: 0.4 10*3/uL (ref 0.0–0.5)
Eosinophils Relative: 4 %
HCT: 34.7 % — ABNORMAL LOW (ref 36.0–46.0)
Hemoglobin: 10.3 g/dL — ABNORMAL LOW (ref 12.0–15.0)
Immature Granulocytes: 0 %
Lymphocytes Relative: 18 %
Lymphs Abs: 1.4 10*3/uL (ref 0.7–4.0)
MCH: 21.9 pg — ABNORMAL LOW (ref 26.0–34.0)
MCHC: 29.7 g/dL — ABNORMAL LOW (ref 30.0–36.0)
MCV: 73.7 fL — ABNORMAL LOW (ref 80.0–100.0)
Monocytes Absolute: 0.7 10*3/uL (ref 0.1–1.0)
Monocytes Relative: 8 %
Neutro Abs: 5.6 10*3/uL (ref 1.7–7.7)
Neutrophils Relative %: 70 %
Platelet Count: 252 10*3/uL (ref 150–400)
RBC: 4.71 MIL/uL (ref 3.87–5.11)
RDW: 16.9 % — ABNORMAL HIGH (ref 11.5–15.5)
WBC Count: 8 10*3/uL (ref 4.0–10.5)
nRBC: 0 % (ref 0.0–0.2)

## 2020-03-25 MED ORDER — SODIUM CHLORIDE 0.9% FLUSH
10.0000 mL | Freq: Once | INTRAVENOUS | Status: AC
  Administered 2020-03-25: 10 mL
  Filled 2020-03-25: qty 10

## 2020-03-25 MED ORDER — DIPHENHYDRAMINE HCL 25 MG PO CAPS
25.0000 mg | ORAL_CAPSULE | Freq: Once | ORAL | Status: AC
  Administered 2020-03-25: 25 mg via ORAL

## 2020-03-25 MED ORDER — SODIUM CHLORIDE 0.9 % IV SOLN
Freq: Once | INTRAVENOUS | Status: AC
  Filled 2020-03-25: qty 250

## 2020-03-25 MED ORDER — HEPARIN SOD (PORK) LOCK FLUSH 100 UNIT/ML IV SOLN
500.0000 [IU] | Freq: Once | INTRAVENOUS | Status: AC | PRN
  Administered 2020-03-25: 500 [IU]
  Filled 2020-03-25: qty 5

## 2020-03-25 MED ORDER — SODIUM CHLORIDE 0.9% FLUSH
10.0000 mL | INTRAVENOUS | Status: DC | PRN
  Administered 2020-03-25: 10 mL
  Filled 2020-03-25: qty 10

## 2020-03-25 MED ORDER — ACETAMINOPHEN 325 MG PO TABS
ORAL_TABLET | ORAL | Status: AC
  Filled 2020-03-25: qty 2

## 2020-03-25 MED ORDER — ACETAMINOPHEN 325 MG PO TABS
650.0000 mg | ORAL_TABLET | Freq: Once | ORAL | Status: AC
  Administered 2020-03-25: 650 mg via ORAL

## 2020-03-25 MED ORDER — TRASTUZUMAB-DKST CHEMO 150 MG IV SOLR
6.0000 mg/kg | Freq: Once | INTRAVENOUS | Status: AC
  Administered 2020-03-25: 672 mg via INTRAVENOUS
  Filled 2020-03-25: qty 32

## 2020-03-25 MED ORDER — DIPHENHYDRAMINE HCL 25 MG PO CAPS
ORAL_CAPSULE | ORAL | Status: AC
  Filled 2020-03-25: qty 1

## 2020-03-25 NOTE — Patient Instructions (Signed)
[facility] Discharge Instructions for Patients Receiving Chemotherapy  Today you received the following chemotherapy agents Trastuzumab (OGIVRI).  To help prevent nausea and vomiting after your treatment, we encourage you to take your nausea medication as prescribed.   If you develop nausea and vomiting that is not controlled by your nausea medication, call the clinic.   BELOW ARE SYMPTOMS THAT SHOULD BE REPORTED IMMEDIATELY:  *FEVER GREATER THAN 100.5 F  *CHILLS WITH OR WITHOUT FEVER  NAUSEA AND VOMITING THAT IS NOT CONTROLLED WITH YOUR NAUSEA MEDICATION  *UNUSUAL SHORTNESS OF BREATH  *UNUSUAL BRUISING OR BLEEDING  TENDERNESS IN MOUTH AND THROAT WITH OR WITHOUT PRESENCE OF ULCERS  *URINARY PROBLEMS  *BOWEL PROBLEMS  UNUSUAL RASH Items with * indicate a potential emergency and should be followed up as soon as possible.  Feel free to call the clinic should you have any questions or concerns. The clinic phone number is [phone].  Please show the [place] at check-in to the Emergency Department and triage nurse.

## 2020-03-30 NOTE — Telephone Encounter (Signed)
Pt has appt on 6/29 with Sagardia

## 2020-04-07 ENCOUNTER — Other Ambulatory Visit: Payer: Self-pay

## 2020-04-07 ENCOUNTER — Ambulatory Visit (INDEPENDENT_AMBULATORY_CARE_PROVIDER_SITE_OTHER): Payer: BC Managed Care – PPO | Admitting: Emergency Medicine

## 2020-04-07 ENCOUNTER — Encounter: Payer: Self-pay | Admitting: Emergency Medicine

## 2020-04-07 VITALS — BP 109/60 | HR 72 | Temp 98.1°F | Resp 16 | Ht 64.0 in | Wt 253.0 lb

## 2020-04-07 DIAGNOSIS — J4520 Mild intermittent asthma, uncomplicated: Secondary | ICD-10-CM

## 2020-04-07 DIAGNOSIS — E785 Hyperlipidemia, unspecified: Secondary | ICD-10-CM

## 2020-04-07 DIAGNOSIS — I10 Essential (primary) hypertension: Secondary | ICD-10-CM | POA: Diagnosis not present

## 2020-04-07 DIAGNOSIS — R7989 Other specified abnormal findings of blood chemistry: Secondary | ICD-10-CM | POA: Diagnosis not present

## 2020-04-07 DIAGNOSIS — E039 Hypothyroidism, unspecified: Secondary | ICD-10-CM

## 2020-04-07 DIAGNOSIS — E1169 Type 2 diabetes mellitus with other specified complication: Secondary | ICD-10-CM | POA: Insufficient documentation

## 2020-04-07 MED ORDER — SIMVASTATIN 20 MG PO TABS: 20.0000 mg | ORAL_TABLET | Freq: Every day | ORAL | 3 refills | Status: DC

## 2020-04-07 MED ORDER — ATENOLOL-CHLORTHALIDONE 100-25 MG PO TABS: 0.5000 | ORAL_TABLET | Freq: Every day | ORAL | 3 refills | Status: DC

## 2020-04-07 MED ORDER — LISINOPRIL 20 MG PO TABS: 20.0000 mg | ORAL_TABLET | Freq: Every day | ORAL | 3 refills | Status: DC

## 2020-04-07 MED ORDER — LEVOTHYROXINE SODIUM 100 MCG PO TABS: 100.0000 ug | ORAL_TABLET | Freq: Every day | ORAL | 3 refills | Status: DC

## 2020-04-07 MED ORDER — ALBUTEROL SULFATE HFA 108 (90 BASE) MCG/ACT IN AERS: 1.0000 | INHALATION_SPRAY | RESPIRATORY_TRACT | 7 refills | Status: DC | PRN

## 2020-04-07 NOTE — Patient Instructions (Addendum)
   If you have lab work done today you will be contacted with your lab results within the next 2 weeks.  If you have not heard from us then please contact us. The fastest way to get your results is to register for My Chart.   IF you received an x-ray today, you will receive an invoice from [facility]. Please contact [facility] at [phone] with questions or concerns regarding your invoice.   IF you received labwork today, you will receive an invoice from [facility]. Please contact [facility] at [phone] with questions or concerns regarding your invoice.   Our billing staff will not be able to assist you with questions regarding bills from these companies.  You will be contacted with the lab results as soon as they are available. The fastest way to get your results is to activate your My Chart account. Instructions are located on the last page of this paperwork. If you have not heard from us regarding the results in 2 weeks, please contact this office.       Health Maintenance, Female Adopting a healthy lifestyle and getting preventive care are important in promoting health and wellness. Ask your health care provider about:  The right schedule for you to have regular tests and exams.  Things you can do on your own to prevent diseases and keep yourself healthy. What should I know about diet, weight, and exercise? Eat a healthy diet   Eat a diet that includes plenty of vegetables, fruits, low-fat dairy products, and lean protein.  Do not eat a lot of foods that are high in solid fats, added sugars, or sodium. Maintain a healthy weight Body mass index (BMI) is used to identify weight problems. It estimates body fat based on height and weight. Your health care provider can help determine your BMI and help you achieve or maintain a healthy weight. Get regular exercise Get regular exercise. This is one of the most important things you can do for your health. Most  adults should:  Exercise for at least 150 minutes each week. The exercise should increase your heart rate and make you sweat (moderate-intensity exercise).  Do strengthening exercises at least twice a week. This is in addition to the moderate-intensity exercise.  Spend less time sitting. Even light physical activity can be beneficial. Watch cholesterol and blood lipids Have your blood tested for lipids and cholesterol at 56 years of age, then have this test every 5 years. Have your cholesterol levels checked more often if:  Your lipid or cholesterol levels are high.  You are older than 56 years of age.  You are at high risk for heart disease. What should I know about cancer screening? Depending on your health history and family history, you may need to have cancer screening at various ages. This may include screening for:  Breast cancer.  Cervical cancer.  Colorectal cancer.  Skin cancer.  Lung cancer. What should I know about heart disease, diabetes, and high blood pressure? Blood pressure and heart disease  High blood pressure causes heart disease and increases the risk of stroke. This is more likely to develop in people who have high blood pressure readings, are of African descent, or are overweight.  Have your blood pressure checked: ? Every 3-5 years if you are 18-39 years of age. ? Every year if you are 40 years old or older. Diabetes Have regular diabetes screenings. This checks your fasting blood sugar level. Have the screening done:  Once   every three years after age 40 if you are at a normal weight and have a low risk for diabetes.  More often and at a younger age if you are overweight or have a high risk for diabetes. What should I know about preventing infection? Hepatitis B If you have a higher risk for hepatitis B, you should be screened for this virus. Talk with your health care provider to find out if you are at risk for hepatitis B infection. Hepatitis  C Testing is recommended for:  Everyone born from 1945 through 1965.  Anyone with known risk factors for hepatitis C. Sexually transmitted infections (STIs)  Get screened for STIs, including gonorrhea and chlamydia, if: ? You are sexually active and are younger than 56 years of age. ? You are older than 56 years of age and your health care provider tells you that you are at risk for this type of infection. ? Your sexual activity has changed since you were last screened, and you are at increased risk for chlamydia or gonorrhea. Ask your health care provider if you are at risk.  Ask your health care provider about whether you are at high risk for HIV. Your health care provider may recommend a prescription medicine to help prevent HIV infection. If you choose to take medicine to prevent HIV, you should first get tested for HIV. You should then be tested every 3 months for as long as you are taking the medicine. Pregnancy  If you are about to stop having your period (premenopausal) and you may become pregnant, seek counseling before you get pregnant.  Take 400 to 800 micrograms (mcg) of folic acid every day if you become pregnant.  Ask for birth control (contraception) if you want to prevent pregnancy. Osteoporosis and menopause Osteoporosis is a disease in which the bones lose minerals and strength with aging. This can result in bone fractures. If you are 65 years old or older, or if you are at risk for osteoporosis and fractures, ask your health care provider if you should:  Be screened for bone loss.  Take a calcium or vitamin D supplement to lower your risk of fractures.  Be given hormone replacement therapy (HRT) to treat symptoms of menopause. Follow these instructions at home: Lifestyle  Do not use any products that contain nicotine or tobacco, such as cigarettes, e-cigarettes, and chewing tobacco. If you need help quitting, ask your health care provider.  Do not use street  drugs.  Do not share needles.  Ask your health care provider for help if you need support or information about quitting drugs. Alcohol use  Do not drink alcohol if: ? Your health care provider tells you not to drink. ? You are pregnant, may be pregnant, or are planning to become pregnant.  If you drink alcohol: ? Limit how much you use to 0-1 drink a day. ? Limit intake if you are breastfeeding.  Be aware of how much alcohol is in your drink. In the [place] one drink equals one 12 oz bottle of beer (355 mL), one 5 oz glass of wine (148 mL), or one 1 oz glass of hard liquor (44 mL). General instructions  Schedule regular health, dental, and eye exams.  Stay current with your vaccines.  Tell your health care provider if: ? You often feel depressed. ? You have ever been abused or do not feel safe at home. Summary  Adopting a healthy lifestyle and getting preventive care are important in promoting health and   wellness.  Follow your health care provider's instructions about healthy diet, exercising, and getting tested or screened for diseases.  Follow your health care provider's instructions on monitoring your cholesterol and blood pressure. This information is not intended to replace advice given to you by your health care provider. Make sure you discuss any questions you have with your health care provider. Document Revised: 09/19/2018 Document Reviewed: 09/19/2018 Elsevier Patient Education  2020 [employer]  

## 2020-04-07 NOTE — Progress Notes (Signed)
Veronica Velazquez 54 y.o.   Chief Complaint  Patient presents with  . Medication Refill    HISTORY OF PRESENT ILLNESS: This is a 54 y.o. female with history of asthma, hypertension, hypothyroidism, and dyslipidemia here for follow-up and medication refill. Has no complaints or medical concerns today. Using albuterol infrequently.  Asthma well controlled. On lisinopril 20 mg and Tenoretic 100-25 mg daily for hypertension. BP Readings from Last 3 Encounters:  04/07/20 109/60  03/25/20 131/82  03/04/20 134/75    Synthroid 100 mcg for hypothyroidism. Simvastatin 20 mg for dyslipidemia. Lab Results  Component Value Date   CHOL 248 (H) 02/01/2018   HDL 59 02/01/2018   LDLCALC 165 (H) 02/01/2018   LDLDIRECT 162 (H) 01/25/2014   TRIG 120 02/01/2018   CHOLHDL 4.2 02/01/2018     HPI   Prior to Admission medications   Medication Sig Start Date End Date Taking? Authorizing Provider  albuterol (VENTOLIN HFA) 108 (90 Base) MCG/ACT inhaler Inhale 1-2 puffs into the lungs every 4 (four) hours as needed for wheezing or shortness of breath (cough, shortness of breath or wheezing.). 03/01/20  Yes [name], PA-C  atenolol-chlorthalidone (TENORETIC) 100-25 MG tablet Take 0.5 tablets by mouth daily. 11/12/19  Yes Wendall Mola, NP  benzonatate (TESSALON) 100 MG capsule Take 1-2 capsules (100-200 mg total) by mouth 3 (three) times daily as needed. 02/29/20  Yes [name], PA-C  ibuprofen (ADVIL) 800 MG tablet Take 1 tablet (800 mg total) by mouth every 8 (eight) hours as needed. 08/09/19  Yes Wendall Mola, NP  levothyroxine (SYNTHROID) 100 MCG tablet Take 1 tablet (100 mcg total) by mouth daily. 03/24/20 04/23/20 Yes Maximiano Coss, NP  lisinopril (ZESTRIL) 20 MG tablet Take 1 tablet (20 mg total) by mouth daily. 03/13/20  Yes Lanea Vankirk, Ines Bloomer, MD  methocarbamol (ROBAXIN) 500 MG tablet Take 1 tablet (500 mg total) by mouth 4 (four) times daily. 08/09/19  Yes Wendall Mola, NP  simvastatin (ZOCOR) 20 MG tablet Take 1 tablet (20 mg total) by mouth daily. 01/28/20 04/27/20 Yes Livio Ledwith, Ines Bloomer, MD  traMADol (ULTRAM) 50 MG tablet Take 1 tablet (50 mg total) by mouth every 6 (six) hours as needed. 01/07/20 01/06/21 Yes Alphonsa Overall, MD  montelukast (SINGULAIR) 10 MG tablet Take 1 tablet (10 mg total) by mouth at bedtime. Patient not taking: Reported on 04/07/2020 02/29/20   [name], PA-C  predniSONE (DELTASONE) 20 MG tablet Take 2 tablets daily with breakfast. Patient not taking: Reported on 04/07/2020 02/29/20   [name], PA-C  promethazine-dextromethorphan (PROMETHAZINE-DM) 6.25-15 MG/5ML syrup Take 5 mLs by mouth at bedtime as needed for cough. Patient not taking: Reported on 04/07/2020 02/29/20   [name], PA-C  traMADol (ULTRAM) 50 MG tablet Take 1 tablet (50 mg total) by mouth every 6 (six) hours as needed for moderate pain or severe pain. Patient not taking: Reported on 04/07/2020 10/15/19   Alphonsa Overall, MD  prochlorperazine (COMPAZINE) 10 MG tablet Take 1 tablet (10 mg total) by mouth every 6 (six) hours as needed (Nausea or vomiting). 10/02/19 12/11/19  Nicholas Lose, MD    No Known Allergies  Patient Active Problem List   Diagnosis Date Noted  . Port-A-Cath in place 10/24/2019  . Malignant neoplasm of upper-outer quadrant of left breast in female, estrogen receptor negative ([place]) 09/30/2019  . Breast mass, left 08/13/2019  . Asthma 09/12/2012  . HTN (hypertension) 09/12/2012  . Hypothyroidism   . Obesity     Past  Medical History:  Diagnosis Date  . Asthma   . Breast mass, left 08/13/2019  . Cancer ([place]) 09/2019   left breast cancer  . Hyperglycemia   . Hypertension   . Hypothyroidism   . Microcytosis   . Obesity   . Personal history of chemotherapy   . Pre-diabetes   . Prediabetes   . Trapezius muscle spasm 08/09/2019    Past Surgical History:  Procedure Laterality Date  . ABDOMINAL HYSTERECTOMY    . BREAST LUMPECTOMY WITH  RADIOACTIVE SEED AND SENTINEL LYMPH NODE BIOPSY Left 01/07/2020   Procedure: LEFT BREAST LUMPECTOMY WITH RADIOACTIVE SEED AND LEFT AXILLARY SENTINEL LYMPH NODE BIOPSY;  Surgeon: Alphonsa Overall, MD;  Location: [place];  Service: General;  Laterality: Left;  . PORTACATH PLACEMENT Right 10/15/2019   Procedure: INSERTION PORT-A-CATH WITH ULTRASOUND;  Surgeon: Alphonsa Overall, MD;  Location: [facility];  Service: General;  Laterality: Right;    Social History   Socioeconomic History  . Marital status: Married    Spouse name: Ghulam  . Number of children: 3  . Years of education: 7  . Highest education level: Not on file  Occupational History  . Occupation: homemaker  Tobacco Use  . Smoking status: Never Smoker  . Smokeless tobacco: Never Used  Substance and Sexual Activity  . Alcohol use: No  . Drug use: No  . Sexual activity: Yes    Partners: Male    Birth control/protection: Surgical  Other Topics Concern  . Not on file  Social History Narrative   Lives with her husband and two daughters.  They care for their 20 year-old granddaughter during the day while her mother (their daughter) works.   Social Determinants of Health   Financial Resource Strain:   . Difficulty of Paying Living Expenses:   Food Insecurity:   . Worried About Charity fundraiser in the Last Year:   . Arboriculturist in the Last Year:   Transportation Needs:   . Film/video editor (Medical):   Marland Kitchen Lack of Transportation (Non-Medical):   Physical Activity:   . Days of Exercise per Week:   . Minutes of Exercise per Session:   Stress:   . Feeling of Stress :   Social Connections:   . Frequency of Communication with Friends and Family:   . Frequency of Social Gatherings with Friends and Family:   . Attends Religious Services:   . Active Member of Clubs or Organizations:   . Attends Archivist Meetings:   Marland Kitchen Marital Status:   Intimate Partner Violence:   . Fear of  Current or Ex-Partner:   . Emotionally Abused:   Marland Kitchen Physically Abused:   . Sexually Abused:     Family History  Problem Relation Age of Onset  . Diabetes Brother        drug-induced     Review of Systems  Constitutional: Negative.  Negative for chills and fever.  HENT: Negative.  Negative for congestion and sore throat.   Eyes: Negative.   Respiratory: Negative.  Negative for cough and shortness of breath.   Cardiovascular: Negative.   Gastrointestinal: Negative for abdominal pain, diarrhea, nausea and vomiting.  Genitourinary: Negative.  Negative for dysuria and urgency.  Musculoskeletal: Negative.  Negative for back pain, myalgias and neck pain.  Skin: Negative.  Negative for rash.  Neurological: Negative for dizziness and headaches.  All other systems reviewed and are negative.  Today's Vitals   04/07/20 1627  BP: 109/60  Pulse: 72  Resp: 16  Temp: 98.1 F (36.7 C)  TempSrc: Temporal  SpO2: 98%  Weight: 253 lb (114.8 kg)  Height: 5\' 4"  (1.626 m)   Body mass index is 43.43 kg/m.   Physical Exam Vitals reviewed.  Constitutional:      Appearance: Normal appearance. She is obese.  HENT:     Head: Normocephalic.  Eyes:     Extraocular Movements: Extraocular movements intact.     Pupils: Pupils are equal, round, and reactive to light.  Cardiovascular:     Rate and Rhythm: Normal rate and regular rhythm.     Pulses: Normal pulses.     Heart sounds: Normal heart sounds.  Pulmonary:     Effort: Pulmonary effort is normal.     Breath sounds: Normal breath sounds.  Musculoskeletal:        General: Normal range of motion.     Cervical back: Normal range of motion and neck supple.  Skin:    General: Skin is warm and dry.     Capillary Refill: Capillary refill takes less than 2 seconds.  Neurological:     General: No focal deficit present.     Mental Status: She is alert and oriented to person, place, and time.  Psychiatric:        Mood and Affect: Mood normal.         Behavior: Behavior normal.    A total of 30 minutes was spent with the patient, greater than 50% of which was in counseling/coordination of care regarding multiple chronic medical problems and their treatments, review of all medications and side effects, review of most recent office visit notes, review of most recent blood work results, diet and nutrition, prognosis and need for follow-up.   ASSESSMENT & PLAN: Andersen was seen today for medication refill.  Diagnoses and all orders for this visit:  Mild intermittent asthma without complication -     albuterol (VENTOLIN HFA) 108 (90 Base) MCG/ACT inhaler; Inhale 1-2 puffs into the lungs every 4 (four) hours as needed for wheezing or shortness of breath (cough, shortness of breath or wheezing.).  Abnormal TSH -     levothyroxine (SYNTHROID) 100 MCG tablet; Take 1 tablet (100 mcg total) by mouth daily.  Essential hypertension, benign  Hyperlipidemia, unspecified hyperlipidemia type -     simvastatin (ZOCOR) 20 MG tablet; Take 1 tablet (20 mg total) by mouth daily.  Hypothyroidism, unspecified type  Other orders -     atenolol-chlorthalidone (TENORETIC) 100-25 MG tablet; Take 0.5 tablets by mouth daily. -     lisinopril (ZESTRIL) 20 MG tablet; Take 1 tablet (20 mg total) by mouth daily.    Patient Instructions       If you have lab work done today you will be contacted with your lab results within the next 2 weeks.  If you have not heard from [place] then please contact us. The fastest way to get your results is to register for My Chart.   IF you received an x-ray today, you will receive an invoice from [facility]. Please contact [facility] at [phone] with questions or concerns regarding your invoice.   IF you received labwork today, you will receive an invoice from [facility]. Please contact [facility] at [phone] with questions or concerns regarding your invoice.   Our billing staff will not be able  to assist you with questions regarding bills from these companies.  You will be contacted with the lab results as  soon as they are available. The fastest way to get your results is to activate your My Chart account. Instructions are located on the last page of this paperwork. If you have not heard from [place] regarding the results in 2 weeks, please contact this office.     Health Maintenance, Female Adopting a healthy lifestyle and getting preventive care are important in promoting health and wellness. Ask your health care provider about:  The right schedule for you to have regular tests and exams.  Things you can do on your own to prevent diseases and keep yourself healthy. What should I know about diet, weight, and exercise? Eat a healthy diet   Eat a diet that includes plenty of vegetables, fruits, low-fat dairy products, and lean protein.  Do not eat a lot of foods that are high in solid fats, added sugars, or sodium. Maintain a healthy weight Body mass index (BMI) is used to identify weight problems. It estimates body fat based on height and weight. Your health care provider can help determine your BMI and help you achieve or maintain a healthy weight. Get regular exercise Get regular exercise. This is one of the most important things you can do for your health. Most adults should:  Exercise for at least 150 minutes each week. The exercise should increase your heart rate and make you sweat (moderate-intensity exercise).  Do strengthening exercises at least twice a week. This is in addition to the moderate-intensity exercise.  Spend less time sitting. Even light physical activity can be beneficial. Watch cholesterol and blood lipids Have your blood tested for lipids and cholesterol at 56 years of age, then have this test every 5 years. Have your cholesterol levels checked more often if:  Your lipid or cholesterol levels are high.  You are older than 56 years of age.  You are at  high risk for heart disease. What should I know about cancer screening? Depending on your health history and family history, you may need to have cancer screening at various ages. This may include screening for:  Breast cancer.  Cervical cancer.  Colorectal cancer.  Skin cancer.  Lung cancer. What should I know about heart disease, diabetes, and high blood pressure? Blood pressure and heart disease  High blood pressure causes heart disease and increases the risk of stroke. This is more likely to develop in people who have high blood pressure readings, are of African descent, or are overweight.  Have your blood pressure checked: ? Every 3-5 years if you are 54-34 years of age. ? Every year if you are 31 years old or older. Diabetes Have regular diabetes screenings. This checks your fasting blood sugar level. Have the screening done:  Once every three years after age 96 if you are at a normal weight and have a low risk for diabetes.  More often and at a younger age if you are overweight or have a high risk for diabetes. What should I know about preventing infection? Hepatitis B If you have a higher risk for hepatitis B, you should be screened for this virus. Talk with your health care provider to find out if you are at risk for hepatitis B infection. Hepatitis C Testing is recommended for:  Everyone born from [birth year] through 1967.  Anyone with known risk factors for hepatitis C. Sexually transmitted infections (STIs)  Get screened for STIs, including gonorrhea and chlamydia, if: ? You are sexually active and are younger than 56 years of age. ? You are  older than 56 years of age and your health care provider tells you that you are at risk for this type of infection. ? Your sexual activity has changed since you were last screened, and you are at increased risk for chlamydia or gonorrhea. Ask your health care provider if you are at risk.  Ask your health care provider about whether  you are at high risk for HIV. Your health care provider may recommend a prescription medicine to help prevent HIV infection. If you choose to take medicine to prevent HIV, you should first get tested for HIV. You should then be tested every 3 months for as long as you are taking the medicine. Pregnancy  If you are about to stop having your period (premenopausal) and you may become pregnant, seek counseling before you get pregnant.  Take 400 to 800 micrograms (mcg) of folic acid every day if you become pregnant.  Ask for birth control (contraception) if you want to prevent pregnancy. Osteoporosis and menopause Osteoporosis is a disease in which the bones lose minerals and strength with aging. This can result in bone fractures. If you are 29 years old or older, or if you are at risk for osteoporosis and fractures, ask your health care provider if you should:  Be screened for bone loss.  Take a calcium or vitamin D supplement to lower your risk of fractures.  Be given hormone replacement therapy (HRT) to treat symptoms of menopause. Follow these instructions at home: Lifestyle  Do not use any products that contain nicotine or tobacco, such as cigarettes, e-cigarettes, and chewing tobacco. If you need help quitting, ask your health care provider.  Do not use street drugs.  Do not share needles.  Ask your health care provider for help if you need support or information about quitting drugs. Alcohol use  Do not drink alcohol if: ? Your health care provider tells you not to drink. ? You are pregnant, may be pregnant, or are planning to become pregnant.  If you drink alcohol: ? Limit how much you use to 0-1 drink a day. ? Limit intake if you are breastfeeding.  Be aware of how much alcohol is in your drink. In the [place] one drink equals one 12 oz bottle of beer (355 mL), one 5 oz glass of wine (148 mL), or one 1 oz glass of hard liquor (44 mL). General instructions  Schedule regular  health, dental, and eye exams.  Stay current with your vaccines.  Tell your health care provider if: ? You often feel depressed. ? You have ever been abused or do not feel safe at home. Summary  Adopting a healthy lifestyle and getting preventive care are important in promoting health and wellness.  Follow your health care provider's instructions about healthy diet, exercising, and getting tested or screened for diseases.  Follow your health care provider's instructions on monitoring your cholesterol and blood pressure. This information is not intended to replace advice given to you by your health care provider. Make sure you discuss any questions you have with your health care provider. Document Revised: 09/19/2018 Document Reviewed: 09/19/2018 Elsevier Patient Education  2020 Elsevier Inc.      Agustina Caroli, MD [facility]

## 2020-04-14 ENCOUNTER — Encounter: Payer: Self-pay | Admitting: *Deleted

## 2020-04-14 NOTE — Progress Notes (Signed)
Patient Care Team: Horald Pollen, MD as PCP - General (Internal Medicine) Mauro Kaufmann, RN as Oncology Nurse Navigator Rockwell Germany, RN as Oncology Nurse Navigator Nicholas Lose, MD as Consulting Physician (Hematology and Oncology) Eppie Gibson, MD as Attending Physician (Radiation Oncology) Alphonsa Overall, MD as Consulting Physician (General Surgery)  DIAGNOSIS:    ICD-10-CM   1. Malignant neoplasm of upper-outer quadrant of left breast in female, estrogen receptor negative ([place])  C50.412    Z17.1     SUMMARY OF ONCOLOGIC HISTORY: Oncology History  Malignant neoplasm of upper-outer quadrant of left breast in female, estrogen receptor negative ([place])  09/30/2019 Initial Diagnosis   Patient palpated left breast mass. Mammogram showed a 2.3cm mass at the 2 o'clock position, normal-appearing axillary lymph nodes. Biopsy showed IDC, grade 3, HER-2 + (3+), ER/PR -, Ki67 40%.   10/02/2019 Cancer Staging   Staging form: Breast, AJCC 8th Edition - Clinical stage from 10/02/2019: Stage IIA (cT2, cN0, cM0, G3, ER-, PR-, HER2+) - Signed by Nicholas Lose, MD on 10/02/2019   10/16/2019 - 12/10/2019 Chemotherapy   Taxol Herceptin neoadjuvant chemotherapy x5 (stopped early due to neuropathy toxicity)    Genetic Testing   Negative genetic testing. No pathogenic variants identified on the [facility] Common Hereditary Cancers Panel. The report date is 11/26/2019.  The Common Hereditary Cancers Panel offered by [facility] includes sequencing and/or deletion duplication testing of the following 48 genes: APC, ATM, AXIN2, BARD1, BMPR1A, BRCA1, BRCA2, BRIP1, CDH1, CDKN2A (p14ARF), CDKN2A (p16INK4a), CKD4, CHEK2, CTNNA1, DICER1, EPCAM (Deletion/duplication testing only), GREM1 (promoter region deletion/duplication testing only), KIT, MEN1, MLH1, MSH2, MSH3, MSH6, MUTYH, NBN, NF1, NHTL1, PALB2, PDGFRA, PMS2, POLD1, POLE, PTEN, RAD50, RAD51C, RAD51D, RNF43, SDHB, SDHC, SDHD, SMAD4, SMARCA4. STK11, TP53,  TSC1, TSC2, and VHL.  The following genes were evaluated for sequence changes only: SDHA and HOXB13 c.251G>A variant only.   12/11/2019 -  Chemotherapy   Herceptin maintenance   01/07/2020 Surgery   Left lumpectomy Lucia Gaskins): no residual carcinoma, clear margins, 3 left axillary lymph nodes negative.   02/18/2020 - 03/16/2020 Radiation Therapy   Adjuvant radiation     CHIEF COMPLIANT: Herceptin maintenance   INTERVAL HISTORY: Veronica Velazquez is a 54 y.o. with above-mentioned history of left breast cancer whocompletedneoadjuvant chemotherapy, underwent a left lumpectomy, completed radiation on 03/16/20, and is currently on adjuvant Herceptin maintenance.She presents to the clinic todayfor treatment.  ALLERGIES:  has No Known Allergies.  MEDICATIONS:  Current Outpatient Medications  Medication Sig Dispense Refill  . albuterol (VENTOLIN HFA) 108 (90 Base) MCG/ACT inhaler Inhale 1-2 puffs into the lungs every 4 (four) hours as needed for wheezing or shortness of breath (cough, shortness of breath or wheezing.). 18 g 7  . atenolol-chlorthalidone (TENORETIC) 100-25 MG tablet Take 0.5 tablets by mouth daily. 45 tablet 3  . benzonatate (TESSALON) 100 MG capsule Take 1-2 capsules (100-200 mg total) by mouth 3 (three) times daily as needed. 60 capsule 0  . ibuprofen (ADVIL) 800 MG tablet Take 1 tablet (800 mg total) by mouth every 8 (eight) hours as needed. 30 tablet 0  . levothyroxine (SYNTHROID) 100 MCG tablet Take 1 tablet (100 mcg total) by mouth daily. 90 tablet 3  . lisinopril (ZESTRIL) 20 MG tablet Take 1 tablet (20 mg total) by mouth daily. 90 tablet 3  . methocarbamol (ROBAXIN) 500 MG tablet Take 1 tablet (500 mg total) by mouth 4 (four) times daily. 30 tablet 0  . montelukast (SINGULAIR) 10 MG tablet Take 1 tablet (  10 mg total) by mouth at bedtime. (Patient not taking: Reported on 04/07/2020) 90 tablet 0  . predniSONE (DELTASONE) 20 MG tablet Take 2 tablets daily with breakfast.  (Patient not taking: Reported on 04/07/2020) 10 tablet 0  . promethazine-dextromethorphan (PROMETHAZINE-DM) 6.25-15 MG/5ML syrup Take 5 mLs by mouth at bedtime as needed for cough. (Patient not taking: Reported on 04/07/2020) 100 mL 0  . simvastatin (ZOCOR) 20 MG tablet Take 1 tablet (20 mg total) by mouth daily. 90 tablet 3  . traMADol (ULTRAM) 50 MG tablet Take 1 tablet (50 mg total) by mouth every 6 (six) hours as needed for moderate pain or severe pain. (Patient not taking: Reported on 04/07/2020) 10 tablet 0  . traMADol (ULTRAM) 50 MG tablet Take 1 tablet (50 mg total) by mouth every 6 (six) hours as needed. 20 tablet 0   No current facility-administered medications for this visit.    PHYSICAL EXAMINATION: ECOG PERFORMANCE STATUS: 1 - Symptomatic but completely ambulatory  There were no vitals filed for this visit. There were no vitals filed for this visit.  LABORATORY DATA:  I have reviewed the data as listed CMP Latest Ref Rng & Units 03/25/2020 02/12/2020 01/22/2020  Glucose 70 - 99 mg/dL 155(H) 145(H) 132(H)  BUN 6 - 20 mg/dL _0 Creatinine 0.44 - 1.00 mg/dL 0.76 0.75 0.73  Sodium 135 - 145 mmol/L 138 137 139  Potassium 3.5 - 5.1 mmol/L 3.7 3.9 4.1  Chloride 98 - 111 mmol/L 102 100 104  CO2 22 - 32 mmol/L _1 Calcium 8.9 - 10.3 mg/dL 9.5 9.7 9.4  Total Protein 6.5 - 8.1 g/dL 7.3 7.6 7.3  Total Bilirubin 0.3 - 1.2 mg/dL 0.4 0.4 0.3  Alkaline Phos 38 - 126 U/L 104 122 111  AST 15 - 41 U/L 20 18 14(L)  ALT 0 - 44 U/L _2 Lab Results  Component Value Date   WBC 8.0 03/25/2020   HGB 10.3 (L) 03/25/2020   HCT 34.7 (L) 03/25/2020   MCV 73.7 (L) 03/25/2020   PLT 252 03/25/2020   NEUTROABS 5.6 03/25/2020    ASSESSMENT & PLAN:  Malignant neoplasm of upper-outer quadrant of left breast in female, estrogen receptor negative ([place]) 09/30/2019:Patient palpated left breast mass. Mammogram showed a 2.3cm mass at the 2 o'clock position, normal-appearing axillary  lymph nodes. Biopsy showed IDC, grade 3, HER-2 + (3+), ER/PR -, Ki67 40%. T2 N0 stage IIa clinical stage  Treatment plan: 1.Neoadjuvant chemotherapy with Taxol-Herceptin weekly X5(neuropathy caused discontinuation of Taxol) followed by Herceptin maintenance for 1 year  2.Breast conserving surgery with sentinel lymph node biopsy(Dr.Newman) 01/07/20: Path CR, 0/3 LN 3.Adjuvant radiation started 02/18/2020-03/20/20 ----------------------------------------------------------------------------------------------------------------------------------------------------- Plan: continue maintenance Herceptin q 3 weeks.  No role of anti-estrogen therapy  Return to clinic every 3 weeks for Herceptin every 6 weeks to follow-up with me.  No orders of the defined types were placed in this encounter.  The patient has a good understanding of the overall plan. she agrees with it. she will call with any problems that may develop before the next visit here.  Total time spent: 30 mins including face to face time and time spent for planning, charting and coordination of care  Nicholas Lose, MD 04/15/2020  I, Cloyde Reams Dorshimer, am acting as scribe for Dr. Nicholas Lose.  I have reviewed the above documentation for accuracy and completeness, and I agree with the above.

## 2020-04-15 ENCOUNTER — Other Ambulatory Visit: Payer: BC Managed Care – PPO

## 2020-04-15 ENCOUNTER — Other Ambulatory Visit: Payer: Self-pay

## 2020-04-15 ENCOUNTER — Ambulatory Visit
Admission: RE | Admit: 2020-04-15 | Discharge: 2020-04-15 | Disposition: A | Discharge status: Home / Self Care | Payer: BC Managed Care – PPO | Source: Ambulatory Visit | Attending: Radiation Oncology | Admitting: Radiation Oncology

## 2020-04-15 ENCOUNTER — Inpatient Hospital Stay: Payer: BC Managed Care – PPO | Attending: Hematology and Oncology | Admitting: Hematology and Oncology

## 2020-04-15 ENCOUNTER — Inpatient Hospital Stay: Payer: BC Managed Care – PPO

## 2020-04-15 VITALS — BP 122/84 | HR 69 | Temp 98.3°F | Resp 17 | Ht 64.0 in | Wt 252.6 lb

## 2020-04-15 VITALS — BP 131/89 | HR 78 | Temp 98.3°F | Resp 18 | Ht 64.0 in | Wt 252.4 lb

## 2020-04-15 DIAGNOSIS — Z171 Estrogen receptor negative status [ER-]: Secondary | ICD-10-CM | POA: Insufficient documentation

## 2020-04-15 DIAGNOSIS — Z923 Personal history of irradiation: Secondary | ICD-10-CM | POA: Insufficient documentation

## 2020-04-15 DIAGNOSIS — Z79899 Other long term (current) drug therapy: Secondary | ICD-10-CM | POA: Insufficient documentation

## 2020-04-15 DIAGNOSIS — Z5112 Encounter for antineoplastic immunotherapy: Secondary | ICD-10-CM | POA: Insufficient documentation

## 2020-04-15 DIAGNOSIS — C50412 Malignant neoplasm of upper-outer quadrant of left female breast: Secondary | ICD-10-CM | POA: Diagnosis not present

## 2020-04-15 DIAGNOSIS — Z9221 Personal history of antineoplastic chemotherapy: Secondary | ICD-10-CM | POA: Diagnosis not present

## 2020-04-15 DIAGNOSIS — I429 Cardiomyopathy, unspecified: Secondary | ICD-10-CM | POA: Diagnosis not present

## 2020-04-15 MED ORDER — DIPHENHYDRAMINE HCL 25 MG PO CAPS
ORAL_CAPSULE | ORAL | Status: AC
  Filled 2020-04-15: qty 1

## 2020-04-15 MED ORDER — SODIUM CHLORIDE 0.9 % IV SOLN
Freq: Once | INTRAVENOUS | Status: AC
  Filled 2020-04-15: qty 250

## 2020-04-15 MED ORDER — TRASTUZUMAB-DKST CHEMO 150 MG IV SOLR
6.0000 mg/kg | Freq: Once | INTRAVENOUS | Status: AC
  Administered 2020-04-15: 672 mg via INTRAVENOUS
  Filled 2020-04-15: qty 32

## 2020-04-15 MED ORDER — ACETAMINOPHEN 325 MG PO TABS
650.0000 mg | ORAL_TABLET | Freq: Once | ORAL | Status: AC
  Administered 2020-04-15: 650 mg via ORAL

## 2020-04-15 MED ORDER — SODIUM CHLORIDE 0.9% FLUSH
10.0000 mL | INTRAVENOUS | Status: DC | PRN
  Administered 2020-04-15: 10 mL
  Filled 2020-04-15: qty 10

## 2020-04-15 MED ORDER — DIPHENHYDRAMINE HCL 25 MG PO CAPS
25.0000 mg | ORAL_CAPSULE | Freq: Once | ORAL | Status: AC
  Administered 2020-04-15: 25 mg via ORAL

## 2020-04-15 MED ORDER — ACETAMINOPHEN 325 MG PO TABS
ORAL_TABLET | ORAL | Status: AC
  Filled 2020-04-15: qty 2

## 2020-04-15 MED ORDER — HEPARIN SOD (PORK) LOCK FLUSH 100 UNIT/ML IV SOLN
500.0000 [IU] | Freq: Once | INTRAVENOUS | Status: AC | PRN
  Administered 2020-04-15: 500 [IU]
  Filled 2020-04-15: qty 5

## 2020-04-15 NOTE — Progress Notes (Signed)
Veronica Velazquez presents today for 1 month follow up after completing radiation to the left breast on 03/16/2020  Skin: patient reports her left breast is fully healed; on assessment skin is intact and almost completely uniform in coloring Pain: patient reports occaisonal left lateral breast pain, but states that it occurs randomly and resolves on its own. ROM: patient denies any issues Swelling: patient denies, and both breasts appear equal in size Other issues: Nothing of note. Patient has follow-up with Dr. Nicholas Lose later today as well  Vitals:   04/15/20 1128  BP: 131/89  Pulse: 78  Resp: 18  Temp: 98.3 F (36.8 C)  SpO2: 99%   Wt Readings from Last 3 Encounters:  04/15/20 252 lb 6 oz (114.5 kg)  04/07/20 253 lb (114.8 kg)  03/25/20 252 lb (114.3 kg)

## 2020-04-15 NOTE — Assessment & Plan Note (Signed)
09/30/2019:Patient palpated left breast mass. Mammogram showed a 2.3cm mass at the 2 o'clock position, normal-appearing axillary lymph nodes. Biopsy showed IDC, grade 3, HER-2 + (3+), ER/PR -, Ki67 40%. T2 N0 stage IIa clinical stage  Treatment plan: 1.Neoadjuvant chemotherapy with Taxol-Herceptin weekly X5(neuropathy caused discontinuation of Taxol) followed by Herceptin maintenance for 1 year  2.Breast conserving surgery with sentinel lymph node biopsy(Dr.Newman) 01/07/20: Path CR, 0/3 LN 3.Adjuvant radiation started 02/18/2020 ----------------------------------------------------------------------------------------------------------------------------------------------------- Plan: continue maintenance Herceptin q 3 weeks. Referral for adj XRT No role of anti-estrogen therapy  Return to clinic every 3 weeks for Herceptin every 6 weeks to follow-up with me.

## 2020-04-15 NOTE — Patient Instructions (Signed)
[facility] Discharge Instructions for Patients Receiving Chemotherapy  Today you received the following chemotherapy agents Trastuzumab (OGIVRI).  To help prevent nausea and vomiting after your treatment, we encourage you to take your nausea medication as prescribed.   If you develop nausea and vomiting that is not controlled by your nausea medication, call the clinic.   BELOW ARE SYMPTOMS THAT SHOULD BE REPORTED IMMEDIATELY:  *FEVER GREATER THAN 100.5 F  *CHILLS WITH OR WITHOUT FEVER  NAUSEA AND VOMITING THAT IS NOT CONTROLLED WITH YOUR NAUSEA MEDICATION  *UNUSUAL SHORTNESS OF BREATH  *UNUSUAL BRUISING OR BLEEDING  TENDERNESS IN MOUTH AND THROAT WITH OR WITHOUT PRESENCE OF ULCERS  *URINARY PROBLEMS  *BOWEL PROBLEMS  UNUSUAL RASH Items with * indicate a potential emergency and should be followed up as soon as possible.  Feel free to call the clinic should you have any questions or concerns. The clinic phone number is [phone].  Please show the [facility] at check-in to the Emergency Department and triage nurse.

## 2020-04-16 ENCOUNTER — Telehealth: Payer: Self-pay | Admitting: Hematology and Oncology

## 2020-04-16 NOTE — Telephone Encounter (Signed)
Scheduled per 7/7 los. Called pt no answer and unable to leave a msg. Mailing appt letter and calendar printout.

## 2020-04-17 ENCOUNTER — Encounter: Payer: Self-pay | Admitting: Radiation Oncology

## 2020-04-17 NOTE — Progress Notes (Signed)
Radiation Oncology         [phone] ________________________________  Name: Beretta Ginsberg MRN: [id number]  Date: 04/15/2020  DOB: 03/12/1966  Follow-Up Visit Note  Outpatient  CC: Horald Pollen, MD  Alphonsa Overall, MD  Diagnosis and Prior Radiotherapy:    ICD-10-CM   1. Malignant neoplasm of upper-outer quadrant of left breast in female, estrogen receptor negative ([place])  C50.412    Z17.1     CHIEF COMPLAINT: Here for follow-up and surveillance of breast cancer  Narrative:    Mrs. Felber presents today for 1 month follow up after completing radiation to the left breast on 03/16/2020  Skin: patient reports her left breast is fully healed; on assessment skin is intact and almost completely uniform in coloring Pain: patient reports occasional left lateral breast pain, but states that it occurs randomly and resolves on its own. ROM: patient denies any issues Swelling: patient denies, and both breasts appear equal in size Other issues: Nothing of note. Patient has follow-up with Dr. Nicholas Lose later today as well   ALLERGIES:  has No Known Allergies.  Meds: Current Outpatient Medications  Medication Sig Dispense Refill  . albuterol (VENTOLIN HFA) 108 (90 Base) MCG/ACT inhaler Inhale 1-2 puffs into the lungs every 4 (four) hours as needed for wheezing or shortness of breath (cough, shortness of breath or wheezing.). 18 g 7  . atenolol-chlorthalidone (TENORETIC) 100-25 MG tablet Take 0.5 tablets by mouth daily. 45 tablet 3  . benzonatate (TESSALON) 100 MG capsule Take 1-2 capsules (100-200 mg total) by mouth 3 (three) times daily as needed. 60 capsule 0  . ibuprofen (ADVIL) 800 MG tablet Take 1 tablet (800 mg total) by mouth every 8 (eight) hours as needed. 30 tablet 0  . levothyroxine (SYNTHROID) 100 MCG tablet Take 1 tablet (100 mcg total) by mouth daily. 90 tablet 3  . lisinopril (ZESTRIL) 20 MG tablet Take 1 tablet (20 mg total) by mouth daily. 90 tablet 3    . methocarbamol (ROBAXIN) 500 MG tablet Take 1 tablet (500 mg total) by mouth 4 (four) times daily. 30 tablet 0  . montelukast (SINGULAIR) 10 MG tablet Take 1 tablet (10 mg total) by mouth at bedtime. (Patient not taking: Reported on 04/07/2020) 90 tablet 0  . predniSONE (DELTASONE) 20 MG tablet Take 2 tablets daily with breakfast. (Patient not taking: Reported on 04/07/2020) 10 tablet 0  . promethazine-dextromethorphan (PROMETHAZINE-DM) 6.25-15 MG/5ML syrup Take 5 mLs by mouth at bedtime as needed for cough. (Patient not taking: Reported on 04/07/2020) 100 mL 0  . simvastatin (ZOCOR) 20 MG tablet Take 1 tablet (20 mg total) by mouth daily. 90 tablet 3  . traMADol (ULTRAM) 50 MG tablet Take 1 tablet (50 mg total) by mouth every 6 (six) hours as needed for moderate pain or severe pain. (Patient not taking: Reported on 04/07/2020) 10 tablet 0  . traMADol (ULTRAM) 50 MG tablet Take 1 tablet (50 mg total) by mouth every 6 (six) hours as needed. 20 tablet 0   No current facility-administered medications for this encounter.    Physical Findings: The patient is in no acute distress. Patient is alert and oriented.  height is 5\' 4"  (1.626 m) and weight is 252 lb 6 oz (114.5 kg). Her oral temperature is 98.3 F (36.8 C). Her blood pressure is 131/89 and her pulse is 78. Her respiration is 18 and oxygen saturation is 99%. .    Satisfactory skin healing in radiotherapy fields.  Modest residual hyperpigmentation  over left breast-skin is intact   Lab Findings: Lab Results  Component Value Date   WBC 8.0 03/25/2020   HGB 10.3 (L) 03/25/2020   HCT 34.7 (L) 03/25/2020   MCV 73.7 (L) 03/25/2020   PLT 252 03/25/2020    Radiographic Findings: No results found.  Impression/Plan: Healing well from radiotherapy to the breast tissue.  Continue skin care with topical Vitamin E Oil and / or lotion for at least 2 more months for further healing.  We discussed measures to reduce the risk of infection during the  COVID-19 pandemic.  She is here with her daughter today.  She has received urging by her daughter to receive the vaccine and I again recommended this as well.  She does not want to proceed with the vaccine at this time.  However her daughter knows how to schedule her if she changes her mind.  Continue with yearly mammography and followup with medical oncology. I will see her back on an as-needed basis. I have encouraged her to call if she has any issues or concerns in the future. I wished her the very best.   On date of service, in total, I spent 20 minutes on this encounter. Patient was seen in person.   _____________________________________   Eppie Gibson, MD

## 2020-04-21 ENCOUNTER — Encounter: Payer: Self-pay | Admitting: Emergency Medicine

## 2020-04-22 ENCOUNTER — Other Ambulatory Visit (HOSPITAL_COMMUNITY): Payer: BC Managed Care – PPO

## 2020-04-29 ENCOUNTER — Ambulatory Visit (HOSPITAL_COMMUNITY)
Admission: RE | Admit: 2020-04-29 | Discharge: 2020-04-29 | Disposition: A | Discharge status: Home / Self Care | Payer: BC Managed Care – PPO | Source: Ambulatory Visit | Attending: Hematology and Oncology | Admitting: Hematology and Oncology

## 2020-04-29 ENCOUNTER — Other Ambulatory Visit: Payer: Self-pay

## 2020-04-29 DIAGNOSIS — I429 Cardiomyopathy, unspecified: Secondary | ICD-10-CM | POA: Diagnosis not present

## 2020-04-29 DIAGNOSIS — I119 Hypertensive heart disease without heart failure: Secondary | ICD-10-CM | POA: Insufficient documentation

## 2020-04-29 DIAGNOSIS — E039 Hypothyroidism, unspecified: Secondary | ICD-10-CM | POA: Insufficient documentation

## 2020-04-29 LAB — ECHOCARDIOGRAM COMPLETE
Area-P 1/2: 3.91 cm2
S' Lateral: 3.1 cm

## 2020-04-29 NOTE — Progress Notes (Signed)
  Echocardiogram 2D Echocardiogram has been performed.  Veronica Velazquez 04/29/2020, 12:24 PM

## 2020-05-06 ENCOUNTER — Inpatient Hospital Stay: Payer: BC Managed Care – PPO

## 2020-05-06 ENCOUNTER — Other Ambulatory Visit: Payer: Self-pay

## 2020-05-06 VITALS — BP 144/78 | HR 67 | Temp 98.5°F | Resp 18 | Wt 259.0 lb

## 2020-05-06 DIAGNOSIS — Z9221 Personal history of antineoplastic chemotherapy: Secondary | ICD-10-CM | POA: Diagnosis not present

## 2020-05-06 DIAGNOSIS — Z171 Estrogen receptor negative status [ER-]: Secondary | ICD-10-CM

## 2020-05-06 DIAGNOSIS — Z95828 Presence of other vascular implants and grafts: Secondary | ICD-10-CM

## 2020-05-06 DIAGNOSIS — Z923 Personal history of irradiation: Secondary | ICD-10-CM | POA: Diagnosis not present

## 2020-05-06 DIAGNOSIS — C50412 Malignant neoplasm of upper-outer quadrant of left female breast: Secondary | ICD-10-CM

## 2020-05-06 DIAGNOSIS — Z5112 Encounter for antineoplastic immunotherapy: Secondary | ICD-10-CM | POA: Diagnosis not present

## 2020-05-06 DIAGNOSIS — Z79899 Other long term (current) drug therapy: Secondary | ICD-10-CM | POA: Diagnosis not present

## 2020-05-06 LAB — CBC WITH DIFFERENTIAL (CANCER CENTER ONLY)
Abs Immature Granulocytes: 0.03 10*3/uL (ref 0.00–0.07)
Basophils Absolute: 0 10*3/uL (ref 0.0–0.1)
Basophils Relative: 0 %
Eosinophils Absolute: 0.4 10*3/uL (ref 0.0–0.5)
Eosinophils Relative: 5 %
HCT: 32.3 % — ABNORMAL LOW (ref 36.0–46.0)
Hemoglobin: 9.7 g/dL — ABNORMAL LOW (ref 12.0–15.0)
Immature Granulocytes: 0 %
Lymphocytes Relative: 20 %
Lymphs Abs: 1.6 10*3/uL (ref 0.7–4.0)
MCH: 21.7 pg — ABNORMAL LOW (ref 26.0–34.0)
MCHC: 30 g/dL (ref 30.0–36.0)
MCV: 72.3 fL — ABNORMAL LOW (ref 80.0–100.0)
Monocytes Absolute: 0.7 10*3/uL (ref 0.1–1.0)
Monocytes Relative: 8 %
Neutro Abs: 5.3 10*3/uL (ref 1.7–7.7)
Neutrophils Relative %: 67 %
Platelet Count: 378 10*3/uL (ref 150–400)
RBC: 4.47 MIL/uL (ref 3.87–5.11)
RDW: 17.8 % — ABNORMAL HIGH (ref 11.5–15.5)
WBC Count: 8.1 10*3/uL (ref 4.0–10.5)
nRBC: 0 % (ref 0.0–0.2)

## 2020-05-06 LAB — CMP (CANCER CENTER ONLY)
ALT: 10 U/L (ref 0–44)
AST: 12 U/L — ABNORMAL LOW (ref 15–41)
Albumin: 3.2 g/dL — ABNORMAL LOW (ref 3.5–5.0)
Alkaline Phosphatase: 104 U/L (ref 38–126)
Anion gap: 7 (ref 5–15)
BUN: 12 mg/dL (ref 6–20)
CO2: 28 mmol/L (ref 22–32)
Calcium: 9.8 mg/dL (ref 8.9–10.3)
Chloride: 103 mmol/L (ref 98–111)
Creatinine: 0.75 mg/dL (ref 0.44–1.00)
GFR, Est AFR Am: >60 mL/min (ref >60)
GFR, Estimated: >60 mL/min (ref >60)
Glucose, Bld: 138 mg/dL — ABNORMAL HIGH (ref 70–99)
Potassium: 3.8 mmol/L (ref 3.5–5.1)
Sodium: 138 mmol/L (ref 135–145)
Total Bilirubin: 0.3 mg/dL (ref 0.3–1.2)
Total Protein: 7.1 g/dL (ref 6.5–8.1)

## 2020-05-06 MED ORDER — ACETAMINOPHEN 325 MG PO TABS
650.0000 mg | ORAL_TABLET | Freq: Once | ORAL | Status: AC
  Administered 2020-05-06: 650 mg via ORAL

## 2020-05-06 MED ORDER — DIPHENHYDRAMINE HCL 25 MG PO CAPS
25.0000 mg | ORAL_CAPSULE | Freq: Once | ORAL | Status: AC
  Administered 2020-05-06: 25 mg via ORAL

## 2020-05-06 MED ORDER — ACETAMINOPHEN 325 MG PO TABS
ORAL_TABLET | ORAL | Status: AC
  Filled 2020-05-06: qty 2

## 2020-05-06 MED ORDER — SODIUM CHLORIDE 0.9% FLUSH
10.0000 mL | INTRAVENOUS | Status: DC | PRN
  Administered 2020-05-06: 10 mL
  Filled 2020-05-06: qty 10

## 2020-05-06 MED ORDER — SODIUM CHLORIDE 0.9 % IV SOLN
Freq: Once | INTRAVENOUS | Status: AC
  Filled 2020-05-06: qty 250

## 2020-05-06 MED ORDER — TRASTUZUMAB-DKST CHEMO 150 MG IV SOLR
6.0000 mg/kg | Freq: Once | INTRAVENOUS | Status: AC
  Administered 2020-05-06: 672 mg via INTRAVENOUS
  Filled 2020-05-06: qty 32

## 2020-05-06 MED ORDER — HEPARIN SOD (PORK) LOCK FLUSH 100 UNIT/ML IV SOLN
500.0000 [IU] | Freq: Once | INTRAVENOUS | Status: AC | PRN
  Administered 2020-05-06: 500 [IU]
  Filled 2020-05-06: qty 5

## 2020-05-06 MED ORDER — SODIUM CHLORIDE 0.9% FLUSH
10.0000 mL | Freq: Once | INTRAVENOUS | Status: AC
  Administered 2020-05-06: 10 mL
  Filled 2020-05-06: qty 10

## 2020-05-06 MED ORDER — DIPHENHYDRAMINE HCL 25 MG PO CAPS
ORAL_CAPSULE | ORAL | Status: AC
  Filled 2020-05-06: qty 1

## 2020-05-06 NOTE — Patient Instructions (Signed)
[facility] Discharge Instructions for Patients Receiving Chemotherapy  Today you received the following chemotherapy agents Trastuzumab (Ogivri)  To help prevent nausea and vomiting after your treatment, we encourage you to take your nausea medication as directed.  If you develop nausea and vomiting that is not controlled by your nausea medication, call the clinic.   BELOW ARE SYMPTOMS THAT SHOULD BE REPORTED IMMEDIATELY:  *FEVER GREATER THAN 100.5 F  *CHILLS WITH OR WITHOUT FEVER  NAUSEA AND VOMITING THAT IS NOT CONTROLLED WITH YOUR NAUSEA MEDICATION  *UNUSUAL SHORTNESS OF BREATH  *UNUSUAL BRUISING OR BLEEDING  TENDERNESS IN MOUTH AND THROAT WITH OR WITHOUT PRESENCE OF ULCERS  *URINARY PROBLEMS  *BOWEL PROBLEMS  UNUSUAL RASH Items with * indicate a potential emergency and should be followed up as soon as possible.  Feel free to call the clinic should you have any questions or concerns. The clinic phone number is [phone].  Please show the [facility] at check-in to the Emergency Department and triage nurse.

## 2020-05-06 NOTE — Patient Instructions (Signed)

## 2020-05-07 ENCOUNTER — Institutional Professional Consult (permissible substitution): Payer: BC Managed Care – PPO | Admitting: Pulmonary Disease

## 2020-05-11 NOTE — Progress Notes (Signed)
  Patient Name: Veronica Velazquez MRN: [id number] DOB: [birth year] Referring Physician: Lucia Gaskins DAVID (Profile Not Attached) Date of Service: 03/16/2020 [facility], [place]                                                        End Of Treatment Note  Diagnoses: C50.412-Malignant neoplasm of upper-outer quadrant of left female breast  Cancer Staging: Cancer Staging Malignant neoplasm of upper-outer quadrant of left breast in female, estrogen receptor negative ([place]) Staging form: Breast, AJCC 8th Edition - Clinical stage from 10/02/2019: Stage IIA (cT2, cN0, cM0, G3, ER-, PR-, HER2+) - Signed by Nicholas Lose, MD on 10/02/2019   Intent: Curative  Radiation Treatment Dates: 02/17/2020 through 03/16/2020 Site Technique Total Dose (Gy) Dose per Fx (Gy) Completed Fx Beam Energies  Breast, Left: Breast_Lt 3D 40.05/40.05 2.67 15/15 15X  Breast, Left: Breast_Lt_Bst 3D 10/10 2 5/5 6X, 15X   Narrative: The patient tolerated radiation therapy relatively well.   Plan: The patient will follow-up with radiation oncology in 1 mo, or PRN.  -----------------------------------  Eppie Gibson, MD  .

## 2020-05-26 ENCOUNTER — Encounter: Payer: Self-pay | Admitting: *Deleted

## 2020-05-26 ENCOUNTER — Other Ambulatory Visit: Payer: Self-pay

## 2020-05-26 ENCOUNTER — Other Ambulatory Visit: Payer: BC Managed Care – PPO

## 2020-05-26 ENCOUNTER — Inpatient Hospital Stay: Payer: BC Managed Care – PPO | Attending: Hematology and Oncology | Admitting: Hematology and Oncology

## 2020-05-26 ENCOUNTER — Inpatient Hospital Stay: Payer: BC Managed Care – PPO

## 2020-05-26 DIAGNOSIS — C50412 Malignant neoplasm of upper-outer quadrant of left female breast: Secondary | ICD-10-CM

## 2020-05-26 DIAGNOSIS — Z5112 Encounter for antineoplastic immunotherapy: Secondary | ICD-10-CM | POA: Diagnosis not present

## 2020-05-26 DIAGNOSIS — Z79899 Other long term (current) drug therapy: Secondary | ICD-10-CM | POA: Diagnosis not present

## 2020-05-26 DIAGNOSIS — Z171 Estrogen receptor negative status [ER-]: Secondary | ICD-10-CM | POA: Diagnosis not present

## 2020-05-26 DIAGNOSIS — Z923 Personal history of irradiation: Secondary | ICD-10-CM | POA: Diagnosis not present

## 2020-05-26 DIAGNOSIS — Z9221 Personal history of antineoplastic chemotherapy: Secondary | ICD-10-CM | POA: Diagnosis not present

## 2020-05-26 MED ORDER — HEPARIN SOD (PORK) LOCK FLUSH 100 UNIT/ML IV SOLN
500.0000 [IU] | Freq: Once | INTRAVENOUS | Status: AC | PRN
  Administered 2020-05-26: 500 [IU]
  Filled 2020-05-26: qty 5

## 2020-05-26 MED ORDER — DIPHENHYDRAMINE HCL 25 MG PO CAPS
25.0000 mg | ORAL_CAPSULE | Freq: Once | ORAL | Status: AC
  Administered 2020-05-26: 25 mg via ORAL

## 2020-05-26 MED ORDER — TRASTUZUMAB-DKST CHEMO 150 MG IV SOLR
6.0000 mg/kg | Freq: Once | INTRAVENOUS | Status: AC
  Administered 2020-05-26: 672 mg via INTRAVENOUS
  Filled 2020-05-26: qty 32

## 2020-05-26 MED ORDER — SODIUM CHLORIDE 0.9 % IV SOLN
Freq: Once | INTRAVENOUS | Status: AC
  Filled 2020-05-26: qty 250

## 2020-05-26 MED ORDER — ACETAMINOPHEN 325 MG PO TABS
ORAL_TABLET | ORAL | Status: AC
  Filled 2020-05-26: qty 2

## 2020-05-26 MED ORDER — DIPHENHYDRAMINE HCL 25 MG PO CAPS
ORAL_CAPSULE | ORAL | Status: AC
  Filled 2020-05-26: qty 1

## 2020-05-26 MED ORDER — SODIUM CHLORIDE 0.9% FLUSH
10.0000 mL | INTRAVENOUS | Status: DC | PRN
  Administered 2020-05-26: 10 mL
  Filled 2020-05-26: qty 10

## 2020-05-26 MED ORDER — ACETAMINOPHEN 325 MG PO TABS
650.0000 mg | ORAL_TABLET | Freq: Once | ORAL | Status: AC
  Administered 2020-05-26: 650 mg via ORAL

## 2020-05-26 NOTE — Progress Notes (Signed)
Patient Care Team: Horald Pollen, MD as PCP - General (Internal Medicine) Mauro Kaufmann, RN as Oncology Nurse Navigator Rockwell Germany, RN as Oncology Nurse Navigator Nicholas Lose, MD as Consulting Physician (Hematology and Oncology) Eppie Gibson, MD as Attending Physician (Radiation Oncology) Alphonsa Overall, MD as Consulting Physician (General Surgery)  DIAGNOSIS:    ICD-10-CM   1. Malignant neoplasm of upper-outer quadrant of left breast in female, estrogen receptor negative ([place])  C50.412    Z17.1     SUMMARY OF ONCOLOGIC HISTORY: Oncology History  Malignant neoplasm of upper-outer quadrant of left breast in female, estrogen receptor negative ([place])  09/30/2019 Initial Diagnosis   Patient palpated left breast mass. Mammogram showed a 2.3cm mass at the 2 o'clock position, normal-appearing axillary lymph nodes. Biopsy showed IDC, grade 3, HER-2 + (3+), ER/PR -, Ki67 40%.   10/02/2019 Cancer Staging   Staging form: Breast, AJCC 8th Edition - Clinical stage from 10/02/2019: Stage IIA (cT2, cN0, cM0, G3, ER-, PR-, HER2+) - Signed by Nicholas Lose, MD on 10/02/2019   10/16/2019 - 12/10/2019 Chemotherapy   Taxol Herceptin neoadjuvant chemotherapy x5 (stopped early due to neuropathy toxicity)    Genetic Testing   Negative genetic testing. No pathogenic variants identified on the [facility] Common Hereditary Cancers Panel. The report date is 11/26/2019.  The Common Hereditary Cancers Panel offered by [facility] includes sequencing and/or deletion duplication testing of the following 48 genes: APC, ATM, AXIN2, BARD1, BMPR1A, BRCA1, BRCA2, BRIP1, CDH1, CDKN2A (p14ARF), CDKN2A (p16INK4a), CKD4, CHEK2, CTNNA1, DICER1, EPCAM (Deletion/duplication testing only), GREM1 (promoter region deletion/duplication testing only), KIT, MEN1, MLH1, MSH2, MSH3, MSH6, MUTYH, NBN, NF1, NHTL1, PALB2, PDGFRA, PMS2, POLD1, POLE, PTEN, RAD50, RAD51C, RAD51D, RNF43, SDHB, SDHC, SDHD, SMAD4, SMARCA4. STK11, TP53,  TSC1, TSC2, and VHL.  The following genes were evaluated for sequence changes only: SDHA and HOXB13 c.251G>A variant only.   12/11/2019 -  Chemotherapy   Herceptin maintenance   01/07/2020 Surgery   Left lumpectomy Lucia Gaskins): no residual carcinoma, clear margins, 3 left axillary lymph nodes negative.   02/18/2020 - 03/16/2020 Radiation Therapy   Adjuvant radiation     CHIEF COMPLIANT: Herceptin maintenance  INTERVAL HISTORY: Veronica Velazquez is a 54 y.o. with above-mentioned history of left breast cancer whocompletedneoadjuvant chemotherapy,underwent a left lumpectomy, radiation, and is currently on adjuvant Herceptin maintenance.Echo on 04/29/20 showed an ejection fraction of 60-65%. She presents to the clinic todayfortreatment.  ALLERGIES:  has No Known Allergies.  MEDICATIONS:  Current Outpatient Medications  Medication Sig Dispense Refill  . albuterol (VENTOLIN HFA) 108 (90 Base) MCG/ACT inhaler Inhale 1-2 puffs into the lungs every 4 (four) hours as needed for wheezing or shortness of breath (cough, shortness of breath or wheezing.). 18 g 7  . atenolol-chlorthalidone (TENORETIC) 100-25 MG tablet Take 0.5 tablets by mouth daily. 45 tablet 3  . benzonatate (TESSALON) 100 MG capsule Take 1-2 capsules (100-200 mg total) by mouth 3 (three) times daily as needed. 60 capsule 0  . ibuprofen (ADVIL) 800 MG tablet Take 1 tablet (800 mg total) by mouth every 8 (eight) hours as needed. 30 tablet 0  . levothyroxine (SYNTHROID) 100 MCG tablet Take 1 tablet (100 mcg total) by mouth daily. 90 tablet 3  . lisinopril (ZESTRIL) 20 MG tablet Take 1 tablet (20 mg total) by mouth daily. 90 tablet 3  . methocarbamol (ROBAXIN) 500 MG tablet Take 1 tablet (500 mg total) by mouth 4 (four) times daily. 30 tablet 0  . montelukast (SINGULAIR) 10 MG tablet  Take 1 tablet (10 mg total) by mouth at bedtime. (Patient not taking: Reported on 04/07/2020) 90 tablet 0  . predniSONE (DELTASONE) 20 MG tablet Take 2  tablets daily with breakfast. (Patient not taking: Reported on 04/07/2020) 10 tablet 0  . promethazine-dextromethorphan (PROMETHAZINE-DM) 6.25-15 MG/5ML syrup Take 5 mLs by mouth at bedtime as needed for cough. (Patient not taking: Reported on 04/07/2020) 100 mL 0  . simvastatin (ZOCOR) 20 MG tablet Take 1 tablet (20 mg total) by mouth daily. 90 tablet 3  . traMADol (ULTRAM) 50 MG tablet Take 1 tablet (50 mg total) by mouth every 6 (six) hours as needed for moderate pain or severe pain. (Patient not taking: Reported on 04/07/2020) 10 tablet 0  . traMADol (ULTRAM) 50 MG tablet Take 1 tablet (50 mg total) by mouth every 6 (six) hours as needed. 20 tablet 0   No current facility-administered medications for this visit.    PHYSICAL EXAMINATION: ECOG PERFORMANCE STATUS: 1 - Symptomatic but completely ambulatory  There were no vitals filed for this visit. There were no vitals filed for this visit.   LABORATORY DATA:  I have reviewed the data as listed CMP Latest Ref Rng & Units 05/06/2020 03/25/2020 02/12/2020  Glucose 70 - 99 mg/dL 138(H) 155(H) 145(H)  BUN 6 - 20 mg/dL '12 14 19  '$ Creatinine 0.44 - 1.00 mg/dL 0.75 0.76 0.75  Sodium 135 - 145 mmol/L 138 138 137  Potassium 3.5 - 5.1 mmol/L 3.8 3.7 3.9  Chloride 98 - 111 mmol/L 103 102 100  CO2 22 - 32 mmol/L '28 25 27  '$ Calcium 8.9 - 10.3 mg/dL 9.8 9.5 9.7  Total Protein 6.5 - 8.1 g/dL 7.1 7.3 7.6  Total Bilirubin 0.3 - 1.2 mg/dL 0.3 0.4 0.4  Alkaline Phos 38 - 126 U/L 104 104 122  AST 15 - 41 U/L 12(L) 20 18  ALT 0 - 44 U/L '10 22 23    '$ Lab Results  Component Value Date   WBC 8.1 05/06/2020   HGB 9.7 (L) 05/06/2020   HCT 32.3 (L) 05/06/2020   MCV 72.3 (L) 05/06/2020   PLT 378 05/06/2020   NEUTROABS 5.3 05/06/2020    ASSESSMENT & PLAN:  Malignant neoplasm of upper-outer quadrant of left breast in female, estrogen receptor negative ([place]) 09/30/2019:Patient palpated left breast mass. Mammogram showed a 2.3cm mass at the 2 o'clock position,  normal-appearing axillary lymph nodes. Biopsy showed IDC, grade 3, HER-2 + (3+), ER/PR -, Ki67 40%. T2 N0 stage IIa clinical stage  Treatment plan: 1.Neoadjuvant chemotherapy with Taxol-Herceptin weekly X5(neuropathy caused discontinuation of Taxol) followed by Herceptin maintenance for 1 year  2.Breast conserving surgery with sentinel lymph node biopsy(Dr.Newman) 01/07/20: Path CR, 0/3 LN 3.Adjuvant radiation started 02/18/2020-03/20/20 ----------------------------------------------------------------------------------------------------------------------------------------------------- Plan: continue maintenance Herceptin q 3 weeks.  No role of anti-estrogen therapy She received COVID-19 vaccine and did quite well.  Return to clinic every 3 weeks for Herceptin every 6 weeks to follow-up with me.    No orders of the defined types were placed in this encounter.  The patient has a good understanding of the overall plan. she agrees with it. she will call with any problems that may develop before the next visit here.  Total time spent: 30 mins including face to face time and time spent for planning, charting and coordination of care  Nicholas Lose, MD 05/26/2020  I, Cloyde Reams Dorshimer, am acting as scribe for Dr. Nicholas Lose.  I have reviewed the above documentation for accuracy and completeness, and I  agree with the above.

## 2020-05-26 NOTE — Assessment & Plan Note (Signed)
09/30/2019:Patient palpated left breast mass. Mammogram showed a 2.3cm mass at the 2 o'clock position, normal-appearing axillary lymph nodes. Biopsy showed IDC, grade 3, HER-2 + (3+), ER/PR -, Ki67 40%. T2 N0 stage IIa clinical stage  Treatment plan: 1.Neoadjuvant chemotherapy with Taxol-Herceptin weekly X5(neuropathy caused discontinuation of Taxol) followed by Herceptin maintenance for 1 year  2.Breast conserving surgery with sentinel lymph node biopsy(Dr.Newman) 01/07/20: Path CR, 0/3 LN 3.Adjuvant radiation started 02/18/2020-03/20/20 ----------------------------------------------------------------------------------------------------------------------------------------------------- Plan: continue maintenance Herceptin q 3 weeks.  No role of anti-estrogen therapy  Return to clinic every 3 weeks for Herceptin every 6 weeks to follow-up with me. 

## 2020-05-26 NOTE — Patient Instructions (Signed)
[facility] Discharge Instructions for Patients Receiving Chemotherapy  Today you received the following chemotherapy agents trastuzumab.  To help prevent nausea and vomiting after your treatment, we encourage you to take your nausea medication as directed.    If you develop nausea and vomiting that is not controlled by your nausea medication, call the clinic.   BELOW ARE SYMPTOMS THAT SHOULD BE REPORTED IMMEDIATELY:  *FEVER GREATER THAN 100.5 F  *CHILLS WITH OR WITHOUT FEVER  NAUSEA AND VOMITING THAT IS NOT CONTROLLED WITH YOUR NAUSEA MEDICATION  *UNUSUAL SHORTNESS OF BREATH  *UNUSUAL BRUISING OR BLEEDING  TENDERNESS IN MOUTH AND THROAT WITH OR WITHOUT PRESENCE OF ULCERS  *URINARY PROBLEMS  *BOWEL PROBLEMS  UNUSUAL RASH Items with * indicate a potential emergency and should be followed up as soon as possible.  Feel free to call the clinic should you have any questions or concerns. The clinic phone number is [phone].  Please show the CHEMO ALERT CARD at check-in to the Emergency Department and triage nurse.   

## 2020-05-28 ENCOUNTER — Telehealth: Payer: Self-pay | Admitting: Hematology and Oncology

## 2020-05-28 NOTE — Telephone Encounter (Signed)
Scheduled per 8/17 los. Called and spoke with pt, confirmed changes made to appts and pt will confirm appt on mychart

## 2020-06-17 ENCOUNTER — Inpatient Hospital Stay: Payer: BC Managed Care – PPO | Attending: Hematology and Oncology

## 2020-06-17 ENCOUNTER — Other Ambulatory Visit: Payer: BC Managed Care – PPO

## 2020-06-17 ENCOUNTER — Other Ambulatory Visit: Payer: Self-pay

## 2020-06-17 VITALS — BP 164/77 | HR 67 | Temp 98.2°F | Resp 16

## 2020-06-17 DIAGNOSIS — Z79899 Other long term (current) drug therapy: Secondary | ICD-10-CM | POA: Insufficient documentation

## 2020-06-17 DIAGNOSIS — Z9221 Personal history of antineoplastic chemotherapy: Secondary | ICD-10-CM | POA: Diagnosis not present

## 2020-06-17 DIAGNOSIS — D509 Iron deficiency anemia, unspecified: Secondary | ICD-10-CM | POA: Insufficient documentation

## 2020-06-17 DIAGNOSIS — Z5112 Encounter for antineoplastic immunotherapy: Secondary | ICD-10-CM | POA: Insufficient documentation

## 2020-06-17 DIAGNOSIS — C50412 Malignant neoplasm of upper-outer quadrant of left female breast: Secondary | ICD-10-CM | POA: Insufficient documentation

## 2020-06-17 DIAGNOSIS — Z171 Estrogen receptor negative status [ER-]: Secondary | ICD-10-CM | POA: Diagnosis not present

## 2020-06-17 DIAGNOSIS — Z923 Personal history of irradiation: Secondary | ICD-10-CM | POA: Insufficient documentation

## 2020-06-17 MED ORDER — ACETAMINOPHEN 325 MG PO TABS
ORAL_TABLET | ORAL | Status: AC
  Filled 2020-06-17: qty 2

## 2020-06-17 MED ORDER — SODIUM CHLORIDE 0.9% FLUSH
10.0000 mL | INTRAVENOUS | Status: DC | PRN
  Administered 2020-06-17: 10 mL
  Filled 2020-06-17: qty 10

## 2020-06-17 MED ORDER — TRASTUZUMAB-DKST CHEMO 150 MG IV SOLR
6.0000 mg/kg | Freq: Once | INTRAVENOUS | Status: AC
  Administered 2020-06-17: 672 mg via INTRAVENOUS
  Filled 2020-06-17: qty 32

## 2020-06-17 MED ORDER — HEPARIN SOD (PORK) LOCK FLUSH 100 UNIT/ML IV SOLN
500.0000 [IU] | Freq: Once | INTRAVENOUS | Status: AC | PRN
  Administered 2020-06-17: 500 [IU]
  Filled 2020-06-17: qty 5

## 2020-06-17 MED ORDER — DIPHENHYDRAMINE HCL 25 MG PO CAPS
25.0000 mg | ORAL_CAPSULE | Freq: Once | ORAL | Status: AC
  Administered 2020-06-17: 25 mg via ORAL

## 2020-06-17 MED ORDER — SODIUM CHLORIDE 0.9 % IV SOLN
Freq: Once | INTRAVENOUS | Status: AC
  Filled 2020-06-17: qty 250

## 2020-06-17 MED ORDER — ACETAMINOPHEN 325 MG PO TABS
650.0000 mg | ORAL_TABLET | Freq: Once | ORAL | Status: AC
  Administered 2020-06-17: 650 mg via ORAL

## 2020-06-17 NOTE — Patient Instructions (Signed)
[facility] Discharge Instructions for Patients Receiving Chemotherapy  Today you received the following chemotherapy agents trastuzumab.  To help prevent nausea and vomiting after your treatment, we encourage you to take your nausea medication as directed.    If you develop nausea and vomiting that is not controlled by your nausea medication, call the clinic.   BELOW ARE SYMPTOMS THAT SHOULD BE REPORTED IMMEDIATELY:  *FEVER GREATER THAN 100.5 F  *CHILLS WITH OR WITHOUT FEVER  NAUSEA AND VOMITING THAT IS NOT CONTROLLED WITH YOUR NAUSEA MEDICATION  *UNUSUAL SHORTNESS OF BREATH  *UNUSUAL BRUISING OR BLEEDING  TENDERNESS IN MOUTH AND THROAT WITH OR WITHOUT PRESENCE OF ULCERS  *URINARY PROBLEMS  *BOWEL PROBLEMS  UNUSUAL RASH Items with * indicate a potential emergency and should be followed up as soon as possible.  Feel free to call the clinic should you have any questions or concerns. The clinic phone number is [phone].  Please show the CHEMO ALERT CARD at check-in to the Emergency Department and triage nurse.   

## 2020-06-24 ENCOUNTER — Institutional Professional Consult (permissible substitution): Payer: BC Managed Care – PPO | Admitting: Pulmonary Disease

## 2020-07-07 NOTE — Progress Notes (Signed)
Patient Care Team: Horald Pollen, MD as PCP - General (Internal Medicine) Mauro Kaufmann, RN as Oncology Nurse Navigator Rockwell Germany, RN as Oncology Nurse Navigator Nicholas Lose, MD as Consulting Physician (Hematology and Oncology) Eppie Gibson, MD as Attending Physician (Radiation Oncology) Alphonsa Overall, MD as Consulting Physician (General Surgery)  DIAGNOSIS:    ICD-10-CM   1. Microcytic anemia  D50.9 Iron and TIBC    Ferritin  2. Malignant neoplasm of upper-outer quadrant of left breast in female, estrogen receptor negative ([place])  C50.412 MM DIAG BREAST TOMO BILATERAL   Z17.1     SUMMARY OF ONCOLOGIC HISTORY: Oncology History  Malignant neoplasm of upper-outer quadrant of left breast in female, estrogen receptor negative ([place])  09/30/2019 Initial Diagnosis   Patient palpated left breast mass. Mammogram showed a 2.3cm mass at the 2 o'clock position, normal-appearing axillary lymph nodes. Biopsy showed IDC, grade 3, HER-2 + (3+), ER/PR -, Ki67 40%.   10/02/2019 Cancer Staging   Staging form: Breast, AJCC 8th Edition - Clinical stage from 10/02/2019: Stage IIA (cT2, cN0, cM0, G3, ER-, PR-, HER2+) - Signed by Nicholas Lose, MD on 10/02/2019   10/16/2019 - 12/10/2019 Chemotherapy   Taxol Herceptin neoadjuvant chemotherapy x5 (stopped early due to neuropathy toxicity)    Genetic Testing   Negative genetic testing. No pathogenic variants identified on the [employer] Common Hereditary Cancers Panel. The report date is 11/26/2019.  The Common Hereditary Cancers Panel offered by [facility] includes sequencing and/or deletion duplication testing of the following 48 genes: APC, ATM, AXIN2, BARD1, BMPR1A, BRCA1, BRCA2, BRIP1, CDH1, CDKN2A (p14ARF), CDKN2A (p16INK4a), CKD4, CHEK2, CTNNA1, DICER1, EPCAM (Deletion/duplication testing only), GREM1 (promoter region deletion/duplication testing only), KIT, MEN1, MLH1, MSH2, MSH3, MSH6, MUTYH, NBN, NF1, NHTL1, PALB2, PDGFRA, PMS2, POLD1,  POLE, PTEN, RAD50, RAD51C, RAD51D, RNF43, SDHB, SDHC, SDHD, SMAD4, SMARCA4. STK11, TP53, TSC1, TSC2, and VHL.  The following genes were evaluated for sequence changes only: SDHA and HOXB13 c.251G>A variant only.   12/11/2019 -  Chemotherapy   Herceptin maintenance   01/07/2020 Surgery   Left lumpectomy Lucia Gaskins): no residual carcinoma, clear margins, 3 left axillary lymph nodes negative.   02/18/2020 - 03/16/2020 Radiation Therapy   Adjuvant radiation     CHIEF COMPLIANT: Herceptin maintenance  INTERVAL HISTORY: Veronica Velazquez is a 54 y.o. with above-mentioned history of left breast cancer whocompletedneoadjuvant chemotherapy,underwent a left lumpectomy,radiation, andis currently on adjuvant Herceptin maintenance. She presents to the clinic todayfortreatment.She complains of occasional lower left breast tenderness especially if someone touches her breast.  She feels tired and does not want to do any activities or exercises.  ALLERGIES:  has No Known Allergies.  MEDICATIONS:  Current Outpatient Medications  Medication Sig Dispense Refill   albuterol (VENTOLIN HFA) 108 (90 Base) MCG/ACT inhaler Inhale 1-2 puffs into the lungs every 4 (four) hours as needed for wheezing or shortness of breath (cough, shortness of breath or wheezing.). 18 g 7   atenolol-chlorthalidone (TENORETIC) 100-25 MG tablet Take 0.5 tablets by mouth daily. 45 tablet 3   benzonatate (TESSALON) 100 MG capsule Take 1-2 capsules (100-200 mg total) by mouth 3 (three) times daily as needed. 60 capsule 0   ibuprofen (ADVIL) 800 MG tablet Take 1 tablet (800 mg total) by mouth every 8 (eight) hours as needed. 30 tablet 0   levothyroxine (SYNTHROID) 100 MCG tablet Take 1 tablet (100 mcg total) by mouth daily. 90 tablet 3   lisinopril (ZESTRIL) 20 MG tablet Take 1 tablet (20 mg total) by  mouth daily. 90 tablet 3   methocarbamol (ROBAXIN) 500 MG tablet Take 1 tablet (500 mg total) by mouth 4 (four) times daily. 30  tablet 0   montelukast (SINGULAIR) 10 MG tablet Take 1 tablet (10 mg total) by mouth at bedtime. (Patient not taking: Reported on 04/07/2020) 90 tablet 0   predniSONE (DELTASONE) 20 MG tablet Take 2 tablets daily with breakfast. (Patient not taking: Reported on 04/07/2020) 10 tablet 0   promethazine-dextromethorphan (PROMETHAZINE-DM) 6.25-15 MG/5ML syrup Take 5 mLs by mouth at bedtime as needed for cough. (Patient not taking: Reported on 04/07/2020) 100 mL 0   simvastatin (ZOCOR) 20 MG tablet Take 1 tablet (20 mg total) by mouth daily. 90 tablet 3   traMADol (ULTRAM) 50 MG tablet Take 1 tablet (50 mg total) by mouth every 6 (six) hours as needed for moderate pain or severe pain. (Patient not taking: Reported on 04/07/2020) 10 tablet 0   traMADol (ULTRAM) 50 MG tablet Take 1 tablet (50 mg total) by mouth every 6 (six) hours as needed. 20 tablet 0   No current facility-administered medications for this visit.    PHYSICAL EXAMINATION: ECOG PERFORMANCE STATUS: 2 - Symptomatic, <50% confined to bed  Vitals:   07/08/20 0940  BP: 132/83  Pulse: (!) 59  Resp: 17  Temp: (!) 97.4 F (36.3 C)  SpO2: 98%   Filed Weights   07/08/20 0940  Weight: 256 lb 3.2 oz (116.2 kg)    LABORATORY DATA:  I have reviewed the data as listed CMP Latest Ref Rng & Units 05/06/2020 03/25/2020 02/12/2020  Glucose 70 - 99 mg/dL 138(H) 155(H) 145(H)  BUN 6 - 20 mg/dL _0 Creatinine 0.44 - 1.00 mg/dL 0.75 0.76 0.75  Sodium 135 - 145 mmol/L 138 138 137  Potassium 3.5 - 5.1 mmol/L 3.8 3.7 3.9  Chloride 98 - 111 mmol/L 103 102 100  CO2 22 - 32 mmol/L _1 Calcium 8.9 - 10.3 mg/dL 9.8 9.5 9.7  Total Protein 6.5 - 8.1 g/dL 7.1 7.3 7.6  Total Bilirubin 0.3 - 1.2 mg/dL 0.3 0.4 0.4  Alkaline Phos 38 - 126 U/L 104 104 122  AST 15 - 41 U/L 12(L) 20 18  ALT 0 - 44 U/L _2 Lab Results  Component Value Date   WBC 8.4 07/08/2020   HGB 10.1 (L) 07/08/2020   HCT 33.9 (L) 07/08/2020   MCV 70.3 (L)  07/08/2020   PLT 462 (H) 07/08/2020   NEUTROABS 5.6 07/08/2020    ASSESSMENT & PLAN:  Malignant neoplasm of upper-outer quadrant of left breast in female, estrogen receptor negative ([place]) 09/30/2019:Patient palpated left breast mass. Mammogram showed a 2.3cm mass at the 2 o'clock position, normal-appearing axillary lymph nodes. Biopsy showed IDC, grade 3, HER-2 + (3+), ER/PR -, Ki67 40%. T2 N0 stage IIa clinical stage  Treatment plan: 1.Neoadjuvant chemotherapy with Taxol-Herceptin weekly X5(neuropathy caused discontinuation of Taxol) followed by Herceptin maintenance for 1 year  2.Breast conserving surgery with sentinel lymph node biopsy(Dr.Newman) 01/07/20: Path CR, 0/3 LN 3.Adjuvant radiationstarted 02/18/2020-03/20/20 ----------------------------------------------------------------------------------------------------------------------------------------------------- Plan: continue maintenance Herceptin q 3 weeks. ECHO: 04/29/20: EF 60-65% No role of anti-estrogen therapy  Herceptin Toxicities: None  Microcytic anemia: We will obtain iron studies and ferritin today. Return to clinic in 3 weeks for her last treatment of Herceptin. I sent a message to Dr. Lucia Gaskins to remove her port after that. I sent an order for mammogram to be done in December 2021.  Orders Placed This Encounter  Procedures   MM DIAG BREAST TOMO BILATERAL    Standing Status:   Future    Standing Expiration Date:   07/08/2021    Order Specific Question:   Reason for Exam (SYMPTOM  OR DIAGNOSIS REQUIRED)    Answer:   Annual mammograms with H/O breast cancer    Order Specific Question:   Is the patient pregnant?    Answer:   No    Order Specific Question:   Preferred imaging location?    Answer:   [facility]   Iron and TIBC    Standing Status:   Future    Standing Expiration Date:   07/08/2021   Ferritin    Standing Status:   Future    Standing Expiration Date:   07/08/2021   The patient  has a good understanding of the overall plan. she agrees with it. she will call with any problems that may develop before the next visit here.  Total time spent: 30 mins including face to face time and time spent for planning, charting and coordination of care  Nicholas Lose, MD 07/08/2020  I, Cloyde Reams Dorshimer, am acting as scribe for Dr. Nicholas Lose.  I have reviewed the above documentation for accuracy and completeness, and I agree with the above.

## 2020-07-08 ENCOUNTER — Encounter: Payer: Self-pay | Admitting: *Deleted

## 2020-07-08 ENCOUNTER — Inpatient Hospital Stay (HOSPITAL_BASED_OUTPATIENT_CLINIC_OR_DEPARTMENT_OTHER): Payer: BC Managed Care – PPO | Admitting: Hematology and Oncology

## 2020-07-08 ENCOUNTER — Inpatient Hospital Stay: Payer: BC Managed Care – PPO

## 2020-07-08 ENCOUNTER — Other Ambulatory Visit: Payer: Self-pay

## 2020-07-08 VITALS — BP 132/83 | HR 59 | Temp 97.4°F | Resp 17 | Ht 64.0 in | Wt 256.2 lb

## 2020-07-08 DIAGNOSIS — D509 Iron deficiency anemia, unspecified: Secondary | ICD-10-CM

## 2020-07-08 DIAGNOSIS — Z79899 Other long term (current) drug therapy: Secondary | ICD-10-CM | POA: Diagnosis not present

## 2020-07-08 DIAGNOSIS — C50412 Malignant neoplasm of upper-outer quadrant of left female breast: Secondary | ICD-10-CM

## 2020-07-08 DIAGNOSIS — Z95828 Presence of other vascular implants and grafts: Secondary | ICD-10-CM

## 2020-07-08 DIAGNOSIS — Z171 Estrogen receptor negative status [ER-]: Secondary | ICD-10-CM | POA: Diagnosis not present

## 2020-07-08 DIAGNOSIS — Z5112 Encounter for antineoplastic immunotherapy: Secondary | ICD-10-CM | POA: Diagnosis not present

## 2020-07-08 DIAGNOSIS — Z9221 Personal history of antineoplastic chemotherapy: Secondary | ICD-10-CM | POA: Diagnosis not present

## 2020-07-08 DIAGNOSIS — Z923 Personal history of irradiation: Secondary | ICD-10-CM | POA: Diagnosis not present

## 2020-07-08 LAB — CBC WITH DIFFERENTIAL (CANCER CENTER ONLY)
Abs Immature Granulocytes: 0.02 10*3/uL (ref 0.00–0.07)
Basophils Absolute: 0.1 10*3/uL (ref 0.0–0.1)
Basophils Relative: 1 %
Eosinophils Absolute: 0.4 10*3/uL (ref 0.0–0.5)
Eosinophils Relative: 5 %
HCT: 33.9 % — ABNORMAL LOW (ref 36.0–46.0)
Hemoglobin: 10.1 g/dL — ABNORMAL LOW (ref 12.0–15.0)
Immature Granulocytes: 0 %
Lymphocytes Relative: 19 %
Lymphs Abs: 1.6 10*3/uL (ref 0.7–4.0)
MCH: 21 pg — ABNORMAL LOW (ref 26.0–34.0)
MCHC: 29.8 g/dL — ABNORMAL LOW (ref 30.0–36.0)
MCV: 70.3 fL — ABNORMAL LOW (ref 80.0–100.0)
Monocytes Absolute: 0.8 10*3/uL (ref 0.1–1.0)
Monocytes Relative: 9 %
Neutro Abs: 5.6 10*3/uL (ref 1.7–7.7)
Neutrophils Relative %: 66 %
Platelet Count: 462 10*3/uL — ABNORMAL HIGH (ref 150–400)
RBC: 4.82 MIL/uL (ref 3.87–5.11)
RDW: 16.4 % — ABNORMAL HIGH (ref 11.5–15.5)
WBC Count: 8.4 10*3/uL (ref 4.0–10.5)
nRBC: 0 % (ref 0.0–0.2)

## 2020-07-08 LAB — FERRITIN: Ferritin: 7 ng/mL — ABNORMAL LOW (ref 11–307)

## 2020-07-08 LAB — CMP (CANCER CENTER ONLY)
ALT: 12 U/L (ref 0–44)
AST: 14 U/L — ABNORMAL LOW (ref 15–41)
Albumin: 3.3 g/dL — ABNORMAL LOW (ref 3.5–5.0)
Alkaline Phosphatase: 101 U/L (ref 38–126)
Anion gap: 4 — ABNORMAL LOW (ref 5–15)
BUN: 12 mg/dL (ref 6–20)
CO2: 29 mmol/L (ref 22–32)
Calcium: 9.3 mg/dL (ref 8.9–10.3)
Chloride: 103 mmol/L (ref 98–111)
Creatinine: 0.76 mg/dL (ref 0.44–1.00)
GFR, Est AFR Am: >60 mL/min (ref >60)
GFR, Estimated: >60 mL/min (ref >60)
Glucose, Bld: 140 mg/dL — ABNORMAL HIGH (ref 70–99)
Potassium: 3.9 mmol/L (ref 3.5–5.1)
Sodium: 136 mmol/L (ref 135–145)
Total Bilirubin: 0.3 mg/dL (ref 0.3–1.2)
Total Protein: 7.5 g/dL (ref 6.5–8.1)

## 2020-07-08 LAB — IRON AND TIBC
Iron: 28 ug/dL — ABNORMAL LOW (ref 41–142)
Saturation Ratios: 6 % — ABNORMAL LOW (ref 21–57)
TIBC: 430 ug/dL (ref 236–444)
UIBC: 402 ug/dL — ABNORMAL HIGH (ref 120–384)

## 2020-07-08 MED ORDER — TRASTUZUMAB-DKST CHEMO 150 MG IV SOLR
6.0000 mg/kg | Freq: Once | INTRAVENOUS | Status: AC
  Administered 2020-07-08: 672 mg via INTRAVENOUS
  Filled 2020-07-08: qty 32

## 2020-07-08 MED ORDER — ACETAMINOPHEN 325 MG PO TABS
ORAL_TABLET | ORAL | Status: AC
  Filled 2020-07-08: qty 2

## 2020-07-08 MED ORDER — DIPHENHYDRAMINE HCL 25 MG PO CAPS
ORAL_CAPSULE | ORAL | Status: AC
  Filled 2020-07-08: qty 1

## 2020-07-08 MED ORDER — DIPHENHYDRAMINE HCL 25 MG PO CAPS
25.0000 mg | ORAL_CAPSULE | Freq: Once | ORAL | Status: AC
  Administered 2020-07-08: 25 mg via ORAL

## 2020-07-08 MED ORDER — HEPARIN SOD (PORK) LOCK FLUSH 100 UNIT/ML IV SOLN
500.0000 [IU] | Freq: Once | INTRAVENOUS | Status: AC | PRN
  Administered 2020-07-08: 500 [IU]
  Filled 2020-07-08: qty 5

## 2020-07-08 MED ORDER — SODIUM CHLORIDE 0.9% FLUSH
10.0000 mL | Freq: Once | INTRAVENOUS | Status: AC
  Administered 2020-07-08: 10 mL
  Filled 2020-07-08: qty 10

## 2020-07-08 MED ORDER — SODIUM CHLORIDE 0.9 % IV SOLN
Freq: Once | INTRAVENOUS | Status: AC
  Filled 2020-07-08: qty 250

## 2020-07-08 MED ORDER — SODIUM CHLORIDE 0.9% FLUSH
10.0000 mL | INTRAVENOUS | Status: DC | PRN
  Administered 2020-07-08: 10 mL
  Filled 2020-07-08: qty 10

## 2020-07-08 MED ORDER — ACETAMINOPHEN 325 MG PO TABS
650.0000 mg | ORAL_TABLET | Freq: Once | ORAL | Status: AC
  Administered 2020-07-08: 650 mg via ORAL

## 2020-07-08 NOTE — Patient Instructions (Addendum)
[facility] Discharge Instructions for Patients Receiving Chemotherapy  Today you received the following immunotherapy agent: Trastuzumab  To help prevent nausea and vomiting after your treatment, we encourage you to take your nausea medication as directed by your MD.   If you develop nausea and vomiting that is not controlled by your nausea medication, call the clinic.   BELOW ARE SYMPTOMS THAT SHOULD BE REPORTED IMMEDIATELY:  *FEVER GREATER THAN 100.5 F  *CHILLS WITH OR WITHOUT FEVER  NAUSEA AND VOMITING THAT IS NOT CONTROLLED WITH YOUR NAUSEA MEDICATION  *UNUSUAL SHORTNESS OF BREATH  *UNUSUAL BRUISING OR BLEEDING  TENDERNESS IN MOUTH AND THROAT WITH OR WITHOUT PRESENCE OF ULCERS  *URINARY PROBLEMS  *BOWEL PROBLEMS  UNUSUAL RASH Items with * indicate a potential emergency and should be followed up as soon as possible.  Feel free to call the clinic should you have any questions or concerns. The clinic phone number is [phone].  Please show the CHEMO ALERT CARD at check-in to the Emergency Department and triage nurse.   

## 2020-07-08 NOTE — Assessment & Plan Note (Signed)
09/30/2019:Patient palpated left breast mass. Mammogram showed a 2.3cm mass at the 2 o'clock position, normal-appearing axillary lymph nodes. Biopsy showed IDC, grade 3, HER-2 + (3+), ER/PR -, Ki67 40%. T2 N0 stage IIa clinical stage  Treatment plan: 1.Neoadjuvant chemotherapy with Taxol-Herceptin weekly X5(neuropathy caused discontinuation of Taxol) followed by Herceptin maintenance for 1 year  2.Breast conserving surgery with sentinel lymph node biopsy(Dr.Newman) 01/07/20: Path CR, 0/3 LN 3.Adjuvant radiationstarted 02/18/2020-03/20/20 ----------------------------------------------------------------------------------------------------------------------------------------------------- Plan: continue maintenance Herceptin q 3 weeks. ECHO: 04/29/20: EF 60-65% No role of anti-estrogen therapy  Herceptin Toxicities: None  Return to clinic every 3 weeks for Herceptin every 6 weeks to follow-up with me.

## 2020-07-08 NOTE — Patient Instructions (Signed)

## 2020-07-09 ENCOUNTER — Telehealth: Payer: Self-pay | Admitting: Hematology and Oncology

## 2020-07-09 MED ORDER — FERROUS GLUCONATE 324 (38 FE) MG PO TABS: 324.0000 mg | ORAL_TABLET | Freq: Two times a day (BID) | ORAL | 3 refills | Status: DC

## 2020-07-09 NOTE — Telephone Encounter (Signed)
I informed the patient's daughter that she is iron deficient with a ferritin of 7 and iron saturation of 6%.  I recommended that she receive oral iron therapy and I sent a prescription for ferrous gluconate twice a day.  I instructed them to take a stool softener along with it Colace 50 mg over-the-counter.  If she cannot tolerate oral iron then we will consider giving her IV iron.

## 2020-07-09 NOTE — Telephone Encounter (Signed)
No 9/29 los, no changes made to pt schedule

## 2020-07-28 NOTE — Progress Notes (Signed)
Patient Care Team: Mauro Kaufmann, RN as Oncology Nurse Navigator Rockwell Germany, RN as Oncology Nurse Navigator Nicholas Lose, MD as Consulting Physician (Hematology and Oncology) Eppie Gibson, MD as Attending Physician (Radiation Oncology) Alphonsa Overall, MD as Consulting Physician (General Surgery)  DIAGNOSIS:    ICD-10-CM   1. Microcytic anemia  D50.9 CBC with Differential (Cancer Center Only)    Iron and TIBC    Ferritin    CMP ([place] only)  2. Malignant neoplasm of upper-outer quadrant of left breast in female, estrogen receptor negative ([place])  C50.412 CBC with Differential ([place] Only)   Z17.1 Iron and TIBC    Ferritin    CMP ([place] only)    SUMMARY OF ONCOLOGIC HISTORY: Oncology History  Malignant neoplasm of upper-outer quadrant of left breast in female, estrogen receptor negative ([place])  09/30/2019 Initial Diagnosis   Patient palpated left breast mass. Mammogram showed a 2.3cm mass at the 2 o'clock position, normal-appearing axillary lymph nodes. Biopsy showed IDC, grade 3, HER-2 + (3+), ER/PR -, Ki67 40%.   10/02/2019 Cancer Staging   Staging form: Breast, AJCC 8th Edition - Clinical stage from 10/02/2019: Stage IIA (cT2, cN0, cM0, G3, ER-, PR-, HER2+) - Signed by Nicholas Lose, MD on 10/02/2019   10/16/2019 - 12/10/2019 Chemotherapy   Taxol Herceptin neoadjuvant chemotherapy x5 (stopped early due to neuropathy toxicity)    Genetic Testing   Negative genetic testing. No pathogenic variants identified on the [employer] Common Hereditary Cancers Panel. The report date is 11/26/2019.  The Common Hereditary Cancers Panel offered by [facility] includes sequencing and/or deletion duplication testing of the following 48 genes: APC, ATM, AXIN2, BARD1, BMPR1A, BRCA1, BRCA2, BRIP1, CDH1, CDKN2A (p14ARF), CDKN2A (p16INK4a), CKD4, CHEK2, CTNNA1, DICER1, EPCAM (Deletion/duplication testing only), GREM1 (promoter region deletion/duplication testing only), KIT, MEN1,  MLH1, MSH2, MSH3, MSH6, MUTYH, NBN, NF1, NHTL1, PALB2, PDGFRA, PMS2, POLD1, POLE, PTEN, RAD50, RAD51C, RAD51D, RNF43, SDHB, SDHC, SDHD, SMAD4, SMARCA4. STK11, TP53, TSC1, TSC2, and VHL.  The following genes were evaluated for sequence changes only: SDHA and HOXB13 c.251G>A variant only.   12/11/2019 -  Chemotherapy   Herceptin maintenance   01/07/2020 Surgery   Left lumpectomy Lucia Gaskins): no residual carcinoma, clear margins, 3 left axillary lymph nodes negative.   02/18/2020 - 03/16/2020 Radiation Therapy   Adjuvant radiation     CHIEF COMPLIANT: Follow-up of left breast cancer, last Herceptin treatment  INTERVAL HISTORY: Veronica Velazquez is a 54 y.o. with above-mentioned history of left breast cancer whocompletedneoadjuvant chemotherapy,underwent a left lumpectomy,radiation,and completed adjuvant Herceptin maintenance.She also has a history of iron deficiency anemia. She presents to the clinic follow-up. Other than fatigue she is tolerating the treatment extremely well.  Today is the last Herceptin treatment  ALLERGIES:  has No Known Allergies.  MEDICATIONS:  Current Outpatient Medications  Medication Sig Dispense Refill  . albuterol (VENTOLIN HFA) 108 (90 Base) MCG/ACT inhaler Inhale 1-2 puffs into the lungs every 4 (four) hours as needed for wheezing or shortness of breath (cough, shortness of breath or wheezing.). 18 g 7  . atenolol-chlorthalidone (TENORETIC) 100-25 MG tablet Take 0.5 tablets by mouth daily. 45 tablet 3  . benzonatate (TESSALON) 100 MG capsule Take 1-2 capsules (100-200 mg total) by mouth 3 (three) times daily as needed. 60 capsule 0  . ferrous gluconate (FERGON) 324 MG tablet Take 1 tablet (324 mg total) by mouth 2 (two) times daily with a meal. 180 tablet 3  . ibuprofen (ADVIL) 800 MG tablet Take 1  tablet (800 mg total) by mouth every 8 (eight) hours as needed. 30 tablet 0  . levothyroxine (SYNTHROID) 100 MCG tablet Take 1 tablet (100 mcg total) by mouth daily.  90 tablet 3  . lisinopril (ZESTRIL) 20 MG tablet Take 1 tablet (20 mg total) by mouth daily. 90 tablet 3  . methocarbamol (ROBAXIN) 500 MG tablet Take 1 tablet (500 mg total) by mouth 4 (four) times daily. 30 tablet 0  . montelukast (SINGULAIR) 10 MG tablet Take 1 tablet (10 mg total) by mouth at bedtime. (Patient not taking: Reported on 04/07/2020) 90 tablet 0  . predniSONE (DELTASONE) 20 MG tablet Take 2 tablets daily with breakfast. (Patient not taking: Reported on 04/07/2020) 10 tablet 0  . promethazine-dextromethorphan (PROMETHAZINE-DM) 6.25-15 MG/5ML syrup Take 5 mLs by mouth at bedtime as needed for cough. (Patient not taking: Reported on 04/07/2020) 100 mL 0  . simvastatin (ZOCOR) 20 MG tablet Take 1 tablet (20 mg total) by mouth daily. 90 tablet 3  . traMADol (ULTRAM) 50 MG tablet Take 1 tablet (50 mg total) by mouth every 6 (six) hours as needed for moderate pain or severe pain. (Patient not taking: Reported on 04/07/2020) 10 tablet 0  . traMADol (ULTRAM) 50 MG tablet Take 1 tablet (50 mg total) by mouth every 6 (six) hours as needed. 20 tablet 0   No current facility-administered medications for this visit.   Facility-Administered Medications Ordered in Other Visits  Medication Dose Route Frequency Provider Last Rate Last Admin  . sodium chloride flush (NS) 0.9 % injection 10 mL  10 mL Intracatheter PRN Nicholas Lose, MD   10 mL at 07/29/20 1243    PHYSICAL EXAMINATION: ECOG PERFORMANCE STATUS: 2 - Symptomatic, <50% confined to bed  Vitals:   07/29/20 1004  BP: 132/78  Pulse: (!) 57  Resp: 17  Temp: 97.9 F (36.6 C)  SpO2: 98%   Filed Weights   07/29/20 1004  Weight: 254 lb (115.2 kg)     LABORATORY DATA:  I have reviewed the data as listed CMP Latest Ref Rng & Units 07/08/2020 05/06/2020 03/25/2020  Glucose 70 - 99 mg/dL 140(H) 138(H) 155(H)  BUN 6 - 20 mg/dL _0 Creatinine 0.44 - 1.00 mg/dL 0.76 0.75 0.76  Sodium 135 - 145 mmol/L 136 138 138  Potassium 3.5 - 5.1  mmol/L 3.9 3.8 3.7  Chloride 98 - 111 mmol/L 103 103 102  CO2 22 - 32 mmol/L _1 Calcium 8.9 - 10.3 mg/dL 9.3 9.8 9.5  Total Protein 6.5 - 8.1 g/dL 7.5 7.1 7.3  Total Bilirubin 0.3 - 1.2 mg/dL 0.3 0.3 0.4  Alkaline Phos 38 - 126 U/L 101 104 104  AST 15 - 41 U/L 14(L) 12(L) 20  ALT 0 - 44 U/L _2 Lab Results  Component Value Date   WBC 8.4 07/08/2020   HGB 10.1 (L) 07/08/2020   HCT 33.9 (L) 07/08/2020   MCV 70.3 (L) 07/08/2020   PLT 462 (H) 07/08/2020   NEUTROABS 5.6 07/08/2020    ASSESSMENT & PLAN:  No problem-specific Assessment & Plan notes found for this encounter.    Orders Placed This Encounter  Procedures  . CBC with Differential (Cancer Center Only)    Standing Status:   Future    Standing Expiration Date:   07/29/2021  . Iron and TIBC    Standing Status:   Future    Standing Expiration Date:   07/29/2021  . Ferritin  Standing Status:   Future    Standing Expiration Date:   07/29/2021  . CMP ([place] only)    Standing Status:   Future    Standing Expiration Date:   07/29/2021   The patient has a good understanding of the overall plan. she agrees with it. she will call with any problems that may develop before the next visit here.  Total time spent: 30 mins including face to face time and time spent for planning, charting and coordination of care  Nicholas Lose, MD 07/29/2020  I, Cloyde Reams Dorshimer, am acting as scribe for Dr. Nicholas Lose.  I have reviewed the above documentation for accuracy and completeness, and I agree with the above.

## 2020-07-29 ENCOUNTER — Encounter: Payer: Self-pay | Admitting: *Deleted

## 2020-07-29 ENCOUNTER — Other Ambulatory Visit: Payer: BC Managed Care – PPO

## 2020-07-29 ENCOUNTER — Other Ambulatory Visit: Payer: Self-pay

## 2020-07-29 ENCOUNTER — Inpatient Hospital Stay: Payer: BC Managed Care – PPO | Attending: Hematology and Oncology

## 2020-07-29 ENCOUNTER — Inpatient Hospital Stay (HOSPITAL_BASED_OUTPATIENT_CLINIC_OR_DEPARTMENT_OTHER): Payer: BC Managed Care – PPO | Admitting: Hematology and Oncology

## 2020-07-29 VITALS — BP 132/78 | HR 57 | Temp 97.9°F | Resp 17 | Ht 64.0 in | Wt 254.0 lb

## 2020-07-29 DIAGNOSIS — Z171 Estrogen receptor negative status [ER-]: Secondary | ICD-10-CM | POA: Insufficient documentation

## 2020-07-29 DIAGNOSIS — Z923 Personal history of irradiation: Secondary | ICD-10-CM | POA: Insufficient documentation

## 2020-07-29 DIAGNOSIS — Z79899 Other long term (current) drug therapy: Secondary | ICD-10-CM | POA: Diagnosis not present

## 2020-07-29 DIAGNOSIS — Z5112 Encounter for antineoplastic immunotherapy: Secondary | ICD-10-CM | POA: Diagnosis not present

## 2020-07-29 DIAGNOSIS — Z9221 Personal history of antineoplastic chemotherapy: Secondary | ICD-10-CM | POA: Insufficient documentation

## 2020-07-29 DIAGNOSIS — C50412 Malignant neoplasm of upper-outer quadrant of left female breast: Secondary | ICD-10-CM | POA: Diagnosis not present

## 2020-07-29 DIAGNOSIS — D509 Iron deficiency anemia, unspecified: Secondary | ICD-10-CM | POA: Diagnosis not present

## 2020-07-29 DIAGNOSIS — Z112 Encounter for screening for other bacterial diseases: Secondary | ICD-10-CM | POA: Diagnosis not present

## 2020-07-29 MED ORDER — HEPARIN SOD (PORK) LOCK FLUSH 100 UNIT/ML IV SOLN
500.0000 [IU] | Freq: Once | INTRAVENOUS | Status: AC | PRN
  Administered 2020-07-29: 500 [IU]
  Filled 2020-07-29: qty 5

## 2020-07-29 MED ORDER — ACETAMINOPHEN 325 MG PO TABS
650.0000 mg | ORAL_TABLET | Freq: Once | ORAL | Status: AC
  Administered 2020-07-29: 650 mg via ORAL

## 2020-07-29 MED ORDER — DIPHENHYDRAMINE HCL 25 MG PO CAPS
ORAL_CAPSULE | ORAL | Status: AC
  Filled 2020-07-29: qty 1

## 2020-07-29 MED ORDER — DIPHENHYDRAMINE HCL 25 MG PO CAPS
25.0000 mg | ORAL_CAPSULE | Freq: Once | ORAL | Status: AC
  Administered 2020-07-29: 25 mg via ORAL

## 2020-07-29 MED ORDER — SODIUM CHLORIDE 0.9 % IV SOLN
Freq: Once | INTRAVENOUS | Status: AC
  Filled 2020-07-29: qty 250

## 2020-07-29 MED ORDER — ACETAMINOPHEN 325 MG PO TABS
ORAL_TABLET | ORAL | Status: AC
  Filled 2020-07-29: qty 2

## 2020-07-29 MED ORDER — SODIUM CHLORIDE 0.9% FLUSH
10.0000 mL | INTRAVENOUS | Status: DC | PRN
  Administered 2020-07-29: 10 mL
  Filled 2020-07-29: qty 10

## 2020-07-29 MED ORDER — TRASTUZUMAB-DKST CHEMO 150 MG IV SOLR
6.0000 mg/kg | Freq: Once | INTRAVENOUS | Status: AC
  Administered 2020-07-29: 672 mg via INTRAVENOUS
  Filled 2020-07-29: qty 32

## 2020-07-29 NOTE — Patient Instructions (Signed)
[facility] Discharge Instructions for Patients Receiving Chemotherapy  Today you received the following immunotherapy agent: Trastuzumab  To help prevent nausea and vomiting after your treatment, we encourage you to take your nausea medication as directed by your MD.   If you develop nausea and vomiting that is not controlled by your nausea medication, call the clinic.   BELOW ARE SYMPTOMS THAT SHOULD BE REPORTED IMMEDIATELY:  *FEVER GREATER THAN 100.5 F  *CHILLS WITH OR WITHOUT FEVER  NAUSEA AND VOMITING THAT IS NOT CONTROLLED WITH YOUR NAUSEA MEDICATION  *UNUSUAL SHORTNESS OF BREATH  *UNUSUAL BRUISING OR BLEEDING  TENDERNESS IN MOUTH AND THROAT WITH OR WITHOUT PRESENCE OF ULCERS  *URINARY PROBLEMS  *BOWEL PROBLEMS  UNUSUAL RASH Items with * indicate a potential emergency and should be followed up as soon as possible.  Feel free to call the clinic should you have any questions or concerns. The clinic phone number is [phone].  Please show the CHEMO ALERT CARD at check-in to the Emergency Department and triage nurse.   

## 2020-07-29 NOTE — Assessment & Plan Note (Signed)
09/30/2019:Patient palpated left breast mass. Mammogram showed a 2.3cm mass at the 2 o'clock position, normal-appearing axillary lymph nodes. Biopsy showed IDC, grade 3, HER-2 + (3+), ER/PR -, Ki67 40%. T2 N0 stage IIa clinical stage  Treatment plan: 1.Neoadjuvant chemotherapy with Taxol-Herceptin weekly X5(neuropathy caused discontinuation of Taxol) followed by Herceptin maintenance for 1 year completed 07/29/2020 2.Breast conserving surgery with sentinel lymph node biopsy(Dr.Newman) 01/07/20: Path CR, 0/3 LN 3.Adjuvant radiationstarted 02/18/2020-03/20/20 ----------------------------------------------------------------------------------------------------------------------------------------------------- Plan: Surveillance. ECHO: 04/29/20: EF 60-65% No role of anti-estrogen therapy  Microcytic anemia: Secondary to iron deficiency anemia: Currently on oral iron therapy and tolerating it fairly well.  She noticed that her energy levels are improving. I encouraged her to take vitamin D and a multivitamin.  Breast cancer surveillance: Patient has an appointment for mammogram coming up.  Return to clinic in 6 months for follow-up.

## 2020-07-30 ENCOUNTER — Other Ambulatory Visit: Payer: Self-pay | Admitting: Surgery

## 2020-07-30 ENCOUNTER — Telehealth: Payer: Self-pay | Admitting: Hematology and Oncology

## 2020-07-30 NOTE — Telephone Encounter (Signed)
Scheduled per 10/20 los. Called and spoke with pt, confirmed added appts

## 2020-07-31 ENCOUNTER — Encounter: Payer: Self-pay | Admitting: *Deleted

## 2020-08-10 ENCOUNTER — Other Ambulatory Visit: Payer: Self-pay

## 2020-08-10 ENCOUNTER — Encounter (HOSPITAL_COMMUNITY)
Admission: RE | Admit: 2020-08-10 | Discharge: 2020-08-10 | Disposition: A | Discharge status: Home / Self Care | Payer: BC Managed Care – PPO | Source: Ambulatory Visit | Attending: Surgery | Admitting: Surgery

## 2020-08-10 ENCOUNTER — Encounter (HOSPITAL_COMMUNITY): Payer: Self-pay

## 2020-08-10 DIAGNOSIS — Z01812 Encounter for preprocedural laboratory examination: Secondary | ICD-10-CM | POA: Diagnosis not present

## 2020-08-10 HISTORY — DX: Anemia, unspecified: D64.9

## 2020-08-10 HISTORY — DX: Dyspnea, unspecified: R06.00

## 2020-08-10 LAB — BASIC METABOLIC PANEL
Anion gap: 13 (ref 5–15)
BUN: 18 mg/dL (ref 6–20)
CO2: 24 mmol/L (ref 22–32)
Calcium: 9.6 mg/dL (ref 8.9–10.3)
Chloride: 103 mmol/L (ref 98–111)
Creatinine, Ser: 0.8 mg/dL (ref 0.44–1.00)
GFR, Estimated: >60 mL/min (ref >60)
Glucose, Bld: 121 mg/dL — ABNORMAL HIGH (ref 70–99)
Potassium: 4.3 mmol/L (ref 3.5–5.1)
Sodium: 140 mmol/L (ref 135–145)

## 2020-08-10 LAB — CBC
HCT: 38.4 % (ref 36.0–46.0)
Hemoglobin: 11.6 g/dL — ABNORMAL LOW (ref 12.0–15.0)
MCH: 22.6 pg — ABNORMAL LOW (ref 26.0–34.0)
MCHC: 30.2 g/dL (ref 30.0–36.0)
MCV: 74.7 fL — ABNORMAL LOW (ref 80.0–100.0)
Platelets: 382 10*3/uL (ref 150–400)
RBC: 5.14 MIL/uL — ABNORMAL HIGH (ref 3.87–5.11)
RDW: 21.6 % — ABNORMAL HIGH (ref 11.5–15.5)
WBC: 8 10*3/uL (ref 4.0–10.5)
nRBC: 0 % (ref 0.0–0.2)

## 2020-08-10 NOTE — Progress Notes (Signed)
Patient has refused interpreting services provided by hospital .  At time of preop appt waiver was signed and completed by pt along iwht daughter who is interpreting for patient and is on front of chart.

## 2020-08-10 NOTE — Progress Notes (Signed)
DUE TO COVID-19 ONLY ONE VISITOR IS ALLOWED TO COME WITH YOU AND STAY IN THE WAITING ROOM ONLY DURING PRE OP AND PROCEDURE DAY OF SURGERY. THE 1 VISITOR  MAY VISIT WITH YOU AFTER SURGERY IN YOUR PRIVATE ROOM DURING VISITING HOURS ONLY!  YOU NEED TO HAVE A COVID 19 TEST ON__11/03/2020 _____ @_1100______ , THIS TEST MUST BE DONE BEFORE SURGERY,  COVID TESTING SITE [address] [place] [address], IT IS ON THE RIGHT GOING OUT [address] APPROXIMATELY  2 MINUTES PAST [employer] ON THE RIGHT. ONCE YOUR COVID TEST IS COMPLETED,  PLEASE BEGIN THE QUARANTINE INSTRUCTIONS AS OUTLINED IN YOUR HANDOUT.                Veronica Velazquez  08/10/2020   Your procedure is scheduled on:  08/19/2020   Report to [facility] Main  Entrance   Report to admitting at     0630am     Call this number if you have problems the morning of surgery [phone]    Remember: Do not eat food , candy gum or mints :After Midnight. You may have clear liquids from midnight until     CLEAR LIQUID DIET   Foods Allowed                                                                       Coffee and tea, regular and decaf                              Plain Jell-O any favor except red or purple                                            Fruit ices (not with fruit pulp)                                      Iced Popsicles                                     Carbonated beverages, regular and diet                                    Cranberry, grape and apple juices Sports drinks like Gatorade Lightly seasoned clear broth or consume(fat free) Sugar, honey syrup   _____________________________________________________________________    BRUSH YOUR TEETH MORNING OF SURGERY AND RINSE YOUR MOUTH OUT, NO CHEWING GUM CANDY OR MINTS.     Take these medicines the morning of surgery with A SIP OF WATER:  Synthroid, inhalers as usual and bring                                 You may not  have any metal on your body including hair pins and  piercings  Do not wear jewelry, make-up, lotions, powders or perfumes, deodorant             Do not wear nail polish on your fingernails.  Do not shave  48 hours prior to surgery.              Men may shave face and neck.   Do not bring valuables to the hospital. [place].  Contacts, dentures or bridgework may not be worn into surgery.  Leave suitcase in the car. After surgery it may be brought to your room.     Patients discharged the day of surgery will not be allowed to drive home. IF YOU ARE HAVING SURGERY AND GOING HOME THE SAME DAY, YOU MUST HAVE AN ADULT TO DRIVE YOU HOME AND BE WITH YOU FOR 24 HOURS. YOU MAY GO HOME BY TAXI OR UBER OR ORTHERWISE, BUT AN ADULT MUST ACCOMPANY YOU HOME AND STAY WITH YOU FOR 24 HOURS.  Name and phone number of your driver:  Special Instructions: N/A              Please read over the following fact sheets you were given: _____________________________________________________________________  [facility] - Preparing for Surgery Before surgery, you can play an important role.  Because skin is not sterile, your skin needs to be as free of germs as possible.  You can reduce the number of germs on your skin by washing with CHG (chlorahexidine gluconate) soap before surgery.  CHG is an antiseptic cleaner which kills germs and bonds with the skin to continue killing germs even after washing. Please DO NOT use if you have an allergy to CHG or antibacterial soaps.  If your skin becomes reddened/irritated stop using the CHG and inform your nurse when you arrive at Short Stay. Do not shave (including legs and underarms) for at least 48 hours prior to the first CHG shower.  You may shave your face/neck. Please follow these instructions carefully:  1.  Shower with CHG Soap the night before surgery and the  morning of Surgery.  2.  If you choose to wash your  hair, wash your hair first as usual with your  normal  shampoo.  3.  After you shampoo, rinse your hair and body thoroughly to remove the  shampoo.                           4.  Use CHG as you would any other liquid soap.  You can apply chg directly  to the skin and wash                       Gently with a scrungie or clean washcloth.  5.  Apply the CHG Soap to your body ONLY FROM THE NECK DOWN.   Do not use on face/ open                           Wound or open sores. Avoid contact with eyes, ears mouth and genitals (private parts).                       Wash face,  Genitals (private parts) with your normal soap.             6.  Wash thoroughly, paying special attention to the area where your surgery  will be performed.  7.  Thoroughly rinse your body with warm water from the neck down.  8.  DO NOT shower/wash with your normal soap after using and rinsing off  the CHG Soap.                9.  Pat yourself dry with a clean towel.            10.  Wear clean pajamas.            11.  Place clean sheets on your bed the night of your first shower and do not  sleep with pets. Day of Surgery : Do not apply any lotions/deodorants the morning of surgery.  Please wear clean clothes to the hospital/surgery center.  FAILURE TO FOLLOW THESE INSTRUCTIONS MAY RESULT IN THE CANCELLATION OF YOUR SURGERY PATIENT SIGNATURE_________________________________  NURSE SIGNATURE__________________________________  ________________________________________________________________________

## 2020-08-15 ENCOUNTER — Other Ambulatory Visit (HOSPITAL_COMMUNITY)
Admission: RE | Admit: 2020-08-15 | Discharge: 2020-08-15 | Disposition: A | Discharge status: Home / Self Care | Payer: BC Managed Care – PPO | Source: Ambulatory Visit | Attending: Surgery | Admitting: Surgery

## 2020-08-15 DIAGNOSIS — Z20822 Contact with and (suspected) exposure to covid-19: Secondary | ICD-10-CM | POA: Diagnosis not present

## 2020-08-15 DIAGNOSIS — Z01812 Encounter for preprocedural laboratory examination: Secondary | ICD-10-CM | POA: Insufficient documentation

## 2020-08-15 LAB — SARS CORONAVIRUS 2 (TAT 6-24 HRS): SARS Coronavirus 2: NEGATIVE

## 2020-08-18 NOTE — H&P (Signed)
Veronica Velazquez  Location: [facility] Patient #: [id number] DOB: [birth year] Married / Language: English / Race: Refused to Report/Unreported Female  History of Present Illness   The patient is a 54 year old female who presents with a complaint of breast cancer.  The PCP is Dr. Mitchel Honour [facility])  The patient was seen at the Breast [facility] - Oncology is Drs. Veronica Velazquez and Veronica Velazquez She does not speak much [place]. Her other daughter, Veronica Velazquez, is there with her and acted as Optometrist. Veronica Velazquez does not know her Velazquez's story as well..  She underwent a left breast lumpectomy and left axillary SLNBx on 01/07/2020 which showed no residual breast cancer and 0/3 nodes. She completed rad therapy last week. She is on maintenance herceptin. She has breast pain and is fatigued, but looks good today.  Plan: 1. Herceptin maintenance, 2. Follow up in 6 months  Past Medical History: 1. Left breast cancer Biopsy: left breast biopsy at 2 o'clock - 09/24/2019 ([id number]) - IDC, grade 3, ER - 0%, PR - 0%, Ki67 - 40%, Her2Neu - POSITIVE She had neoadjuvant chemotx - had some trouble with peripheral neuropathy Left breast lumpectomy and left axillary SLNBx on 01/07/2020 which showed no residual breast cancer and 0/3 nodes Finished radiation therapy - 03/12/2020  2. Asthma 3. Morbid obesity 4. History of depression 5. HTN 6. On thyroid med 7. She saw Dr. Einar Velazquez several years ago after her brother had an MI She had no significant card issues. 8. Right IJ power port - 10/15/2019 - Veronica Velazquez  Social History: Married. Her husband works at She has 3 children: Her daughter, Veronica Velazquez, 58 yo, is there with her and acted as Optometrist. [name]'s phone - [phone]. 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 Veronica Velazquez - 54 yo in [place] - 54 yo in [employer] - she lives at home  with her parents. she does not work  Allergies Producer, television/film/video, RMA; 03/19/2020 8:43 AM) No Known Allergies  [09/30/2019]: Allergies Reconciled   Medication History [employer], RMA; 03/19/2020 8:43 AM) Proventil HFA (108 (90 Base)MCG/ACT Aerosol Soln, Inhalation) Active. Tenoretic 100 (100-25MG Tablet, Oral) Active. Ibuprofen (800MG Tablet, Oral) Active. Synthroid (100MCG Tablet, Oral) Active. Robaxin (500MG Tablet, Oral) Active. Zestril (20MG Tablet, Oral) Active. Zocor (20MG Tablet, Oral) Active. Medications Reconciled  Vitals (Veronica Velazquez RMA; 03/19/2020 8:44 AM) 03/19/2020 8:43 AM Weight: 249.4 lb Height: 64in Body Surface Area: 2.15 m Body Mass Index: 42.81 kg/m  Temp.: 47F (Temporal)  Pulse: 97 (Regular)  P.OX: 97% (Room air) BP: 138/88(Sitting, Right Wrist, Standard)   Physical Exam   General: Obese Older Veronica Velazquez F who is alert and generally healthy appearing. She did say some words of English. She wore a mask. HEENT: Normal. Pupils equal.  Neck: Supple. No mass. No thyroid mass.  Lymph Nodes: No supraclavicular or cervical nodes.  Breast: Right - large and pendulous - no mass. Port in [place] of right breast Left - large and pendulous. Incision in UOQ looks good  Extremities: Good strength and ROM in upper and lower extremities.  Assessment & Plan  1/  MALIGNANT NEOPLASM OF LEFT BREAST, STAGE 1, ESTROGEN RECEPTOR NEGATIVE (C50.912)  Story: Left breast biopsy at 2 o'clock - 09/24/2019 ([id number]) - IDC, grade 3, ER - 0%, PR - 0%, Ki67 - 40%, Her2Neu - POSITIVE  Left breast lumpectomy and left axillary SLNBx on 01/07/2020 which showed no residual breast cancer and 0/3 nodes  Oncology - Veronica Velazquez and Veronica Velazquez  Plan:  1. Herceptin maintenance   She finished treatment Oct 20th  2. Plan for port removal   Addendum Note(Veronica Velazquez H. Veronica Gaskins MD; 07/30/2020 11:35 AM)   I talked to [facility], daughter, on the phone. She  thinks her Velazquez would do better with MAC anesthesia for the port removal.   She has an appointment to see me in about a week - I am not sure that that is necessary.  2. Asthma 3. Morbid obesity 4. History of depression 5. HTN 6. On thyroid med 7. She saw Dr. Einar Velazquez several years ago after her brother had an MI She had no significant card issues. 8. Right IJ power port - 10/15/2019 - Veronica Schilling, MD, [facility] Office phone:  [phone]

## 2020-08-19 ENCOUNTER — Ambulatory Visit (HOSPITAL_COMMUNITY): Payer: BC Managed Care – PPO | Admitting: Registered Nurse

## 2020-08-19 ENCOUNTER — Encounter (HOSPITAL_COMMUNITY): Payer: Self-pay | Admitting: Surgery

## 2020-08-19 ENCOUNTER — Encounter (HOSPITAL_COMMUNITY)
Admission: RE | Disposition: A | Discharge status: Home / Self Care | Payer: Self-pay | Source: Ambulatory Visit | Attending: Surgery

## 2020-08-19 ENCOUNTER — Ambulatory Visit (HOSPITAL_COMMUNITY)
Admission: RE | Admit: 2020-08-19 | Discharge: 2020-08-19 | Disposition: A | Discharge status: Home / Self Care | Payer: BC Managed Care – PPO | Source: Ambulatory Visit | Attending: Surgery | Admitting: Surgery

## 2020-08-19 DIAGNOSIS — Z452 Encounter for adjustment and management of vascular access device: Secondary | ICD-10-CM | POA: Insufficient documentation

## 2020-08-19 DIAGNOSIS — C50412 Malignant neoplasm of upper-outer quadrant of left female breast: Secondary | ICD-10-CM | POA: Diagnosis not present

## 2020-08-19 DIAGNOSIS — C50912 Malignant neoplasm of unspecified site of left female breast: Secondary | ICD-10-CM | POA: Insufficient documentation

## 2020-08-19 DIAGNOSIS — Z9221 Personal history of antineoplastic chemotherapy: Secondary | ICD-10-CM | POA: Diagnosis not present

## 2020-08-19 DIAGNOSIS — Z171 Estrogen receptor negative status [ER-]: Secondary | ICD-10-CM | POA: Diagnosis not present

## 2020-08-19 HISTORY — PX: PORT-A-CATH REMOVAL: SHX5289

## 2020-08-19 SURGERY — REMOVAL PORT-A-CATH
Anesthesia: Monitor Anesthesia Care | Site: Breast

## 2020-08-19 MED ORDER — PROPOFOL 1000 MG/100ML IV EMUL
INTRAVENOUS | Status: AC
  Filled 2020-08-19: qty 100

## 2020-08-19 MED ORDER — ONDANSETRON HCL 4 MG/2ML IJ SOLN
INTRAMUSCULAR | Status: AC
  Filled 2020-08-19: qty 2

## 2020-08-19 MED ORDER — DEXAMETHASONE SODIUM PHOSPHATE 10 MG/ML IJ SOLN
INTRAMUSCULAR | Status: DC | PRN
  Administered 2020-08-19: 8 mg via INTRAVENOUS

## 2020-08-19 MED ORDER — DEXAMETHASONE SODIUM PHOSPHATE 10 MG/ML IJ SOLN
INTRAMUSCULAR | Status: AC
  Filled 2020-08-19: qty 1

## 2020-08-19 MED ORDER — PROPOFOL 500 MG/50ML IV EMUL
INTRAVENOUS | Status: DC | PRN
  Administered 2020-08-19: 65 ug/kg/min via INTRAVENOUS

## 2020-08-19 MED ORDER — LIDOCAINE HCL (PF) 1 % IJ SOLN
INTRAMUSCULAR | Status: DC | PRN
  Administered 2020-08-19: 16 mL

## 2020-08-19 MED ORDER — ORAL CARE MOUTH RINSE: 15.0000 mL | Freq: Once | OROMUCOSAL | Status: AC

## 2020-08-19 MED ORDER — CHLORHEXIDINE GLUCONATE 4 % EX LIQD: 60.0000 mL | Freq: Once | CUTANEOUS | Status: DC

## 2020-08-19 MED ORDER — EPHEDRINE SULFATE-NACL 50-0.9 MG/10ML-% IV SOSY
PREFILLED_SYRINGE | INTRAVENOUS | Status: DC | PRN
  Administered 2020-08-19: 5 mg via INTRAVENOUS

## 2020-08-19 MED ORDER — ACETAMINOPHEN 10 MG/ML IV SOLN: 1000.0000 mg | Freq: Once | INTRAVENOUS | Status: DC | PRN

## 2020-08-19 MED ORDER — FENTANYL CITRATE (PF) 100 MCG/2ML IJ SOLN
INTRAMUSCULAR | Status: AC
  Filled 2020-08-19: qty 2

## 2020-08-19 MED ORDER — 0.9 % SODIUM CHLORIDE (POUR BTL) OPTIME
TOPICAL | Status: DC | PRN
  Administered 2020-08-19: 1000 mL

## 2020-08-19 MED ORDER — MIDAZOLAM HCL 5 MG/5ML IJ SOLN
INTRAMUSCULAR | Status: DC | PRN
  Administered 2020-08-19: 2 mg via INTRAVENOUS

## 2020-08-19 MED ORDER — EPHEDRINE 5 MG/ML INJ
INTRAVENOUS | Status: AC
  Filled 2020-08-19: qty 10

## 2020-08-19 MED ORDER — FENTANYL CITRATE (PF) 100 MCG/2ML IJ SOLN
INTRAMUSCULAR | Status: DC | PRN
  Administered 2020-08-19: 25 ug via INTRAVENOUS

## 2020-08-19 MED ORDER — MIDAZOLAM HCL 2 MG/2ML IJ SOLN
INTRAMUSCULAR | Status: AC
  Filled 2020-08-19: qty 2

## 2020-08-19 MED ORDER — LIDOCAINE HCL (PF) 1 % IJ SOLN
INTRAMUSCULAR | Status: AC
  Filled 2020-08-19: qty 30

## 2020-08-19 MED ORDER — LACTATED RINGERS IV SOLN: INTRAVENOUS | Status: DC

## 2020-08-19 MED ORDER — HYDROMORPHONE HCL 1 MG/ML IJ SOLN: 0.2500 mg | INTRAMUSCULAR | Status: DC | PRN

## 2020-08-19 MED ORDER — ATENOLOL 50 MG PO TABS
50.0000 mg | ORAL_TABLET | Freq: Once | ORAL | Status: AC
  Administered 2020-08-19: 50 mg via ORAL
  Filled 2020-08-19 (×2): qty 1

## 2020-08-19 MED ORDER — ONDANSETRON HCL 4 MG/2ML IJ SOLN: 4.0000 mg | Freq: Once | INTRAMUSCULAR | Status: DC | PRN

## 2020-08-19 MED ORDER — ONDANSETRON HCL 4 MG/2ML IJ SOLN
INTRAMUSCULAR | Status: DC | PRN
  Administered 2020-08-19: 4 mg via INTRAVENOUS

## 2020-08-19 MED ORDER — CHLORHEXIDINE GLUCONATE 0.12 % MT SOLN
15.0000 mL | Freq: Once | OROMUCOSAL | Status: AC
  Administered 2020-08-19: 15 mL via OROMUCOSAL

## 2020-08-19 SURGICAL SUPPLY — 29 items
BLADE SURG 15 STRL LF DISP TIS (BLADE) ×1 IMPLANT
BLADE SURG 15 STRL SS (BLADE) ×2
CHLORAPREP W/TINT 26 (MISCELLANEOUS) ×3 IMPLANT
CLOSURE WOUND 1/4X4 (GAUZE/BANDAGES/DRESSINGS) ×1
COVER SURGICAL LIGHT HANDLE (MISCELLANEOUS) ×3 IMPLANT
COVER WAND RF STERILE (DRAPES) IMPLANT
DECANTER SPIKE VIAL GLASS SM (MISCELLANEOUS) ×3 IMPLANT
DERMABOND ADVANCED (GAUZE/BANDAGES/DRESSINGS) ×2
DERMABOND ADVANCED .7 DNX12 (GAUZE/BANDAGES/DRESSINGS) ×1 IMPLANT
DRAPE LAPAROTOMY TRNSV 102X78 (DRAPES) ×3 IMPLANT
ELECT COATED BLADE 2.86 ST (ELECTRODE) IMPLANT
ELECT REM PT RETURN 15FT ADLT (MISCELLANEOUS) ×3 IMPLANT
GAUZE 4X4 16PLY RFD (DISPOSABLE) ×3 IMPLANT
GAUZE SPONGE 4X4 12PLY STRL (GAUZE/BANDAGES/DRESSINGS) IMPLANT
GLOVE SURG SYN 7.5  E (GLOVE) ×2
GLOVE SURG SYN 7.5 E (GLOVE) ×1 IMPLANT
GOWN STRL REUS W/TWL XL LVL3 (GOWN DISPOSABLE) ×6 IMPLANT
KIT BASIN OR (CUSTOM PROCEDURE TRAY) ×3 IMPLANT
KIT TURNOVER KIT A (KITS) IMPLANT
NEEDLE HYPO 22GX1.5 SAFETY (NEEDLE) ×3 IMPLANT
NEEDLE HYPO 25X1 1.5 SAFETY (NEEDLE) ×3 IMPLANT
PACK BASIC VI WITH GOWN DISP (CUSTOM PROCEDURE TRAY) ×3 IMPLANT
PENCIL SMOKE EVACUATOR (MISCELLANEOUS) ×3 IMPLANT
STRIP CLOSURE SKIN 1/4X4 (GAUZE/BANDAGES/DRESSINGS) ×2 IMPLANT
SUT MNCRL AB 4-0 PS2 18 (SUTURE) ×3 IMPLANT
SUT VIC AB 3-0 SH 18 (SUTURE) ×3 IMPLANT
SYR BULB IRRIG 60ML STRL (SYRINGE) ×3 IMPLANT
SYR CONTROL 10ML LL (SYRINGE) IMPLANT
TOWEL OR 17X26 10 PK STRL BLUE (TOWEL DISPOSABLE) ×3 IMPLANT

## 2020-08-19 NOTE — Anesthesia Preprocedure Evaluation (Addendum)
Anesthesia Evaluation  Patient identified by MRN, date of birth, ID band Patient awake    Reviewed: Patient's Chart, lab work & pertinent test results, reviewed documented beta blocker date and time   Airway Mallampati: II  TM Distance: >3 FB Neck ROM: Full    Dental  (+) Upper Dentures, Lower Dentures   Pulmonary shortness of breath and with exertion, asthma ,    Pulmonary exam normal        Cardiovascular hypertension, Pt. on medications and Pt. on home beta blockers  Rhythm:Regular Rate:Normal     Neuro/Psych negative neurological ROS  negative psych ROS   GI/Hepatic negative GI ROS, Neg liver ROS,   Endo/Other  Hypothyroidism   Renal/GU negative Renal ROS  negative genitourinary   Musculoskeletal Left breast cancer   Abdominal (+)  Abdomen: soft. Bowel sounds: normal.  Peds  Hematology  (+) anemia ,   Anesthesia Other Findings   Reproductive/Obstetrics negative OB ROS                            Anesthesia Physical Anesthesia Plan  ASA: II  Anesthesia Plan: MAC   Post-op Pain Management:    Induction:   PONV Risk Score and Plan: 2 and Ondansetron, Dexamethasone, Propofol infusion and Treatment may vary due to age or medical condition  Airway Management Planned: Simple Face Mask and Nasal Cannula  Additional Equipment: None  Intra-op Plan:   Post-operative Plan:   Informed Consent: I have reviewed the patients History and Physical, chart, labs and discussed the procedure including the risks, benefits and alternatives for the proposed anesthesia with the patient or authorized representative who has indicated his/her understanding and acceptance.     Dental advisory given  Plan Discussed with: CRNA  Anesthesia Plan Comments: (Lab Results      Component                Value               Date                      WBC                      8.0                 08/10/2020                 HGB                      11.6 (L)            08/10/2020                HCT                      38.4                08/10/2020                MCV                      74.7 (L)            08/10/2020                PLT  382                 08/10/2020          )        Anesthesia Quick Evaluation

## 2020-08-19 NOTE — Anesthesia Postprocedure Evaluation (Signed)
Anesthesia Post Note  Patient: Veronica Velazquez  Procedure(s) Performed: REMOVAL PORT-A-CATH (N/A Breast)     Patient location during evaluation: PACU Anesthesia Type: MAC Level of consciousness: awake and alert Pain management: pain level controlled Vital Signs Assessment: post-procedure vital signs reviewed and stable Respiratory status: spontaneous breathing, nonlabored ventilation, respiratory function stable and patient connected to nasal cannula oxygen Cardiovascular status: stable and blood pressure returned to baseline Postop Assessment: no apparent nausea or vomiting Anesthetic complications: no   No complications documented.  Last Vitals:  Vitals:   08/19/20 0900 08/19/20 0905  BP: 114/75 111/71  Pulse: (!) 56 (!) 58  Resp: 17 16  Temp:    SpO2: 100% 100%    Last Pain:  Vitals:   08/19/20 0905  TempSrc:   PainSc: 0-No pain                 March Rummage Ulla Mckiernan

## 2020-08-19 NOTE — Op Note (Signed)
08/19/2020  8:53 AM  PATIENT:  Veronica Velazquez, 54 y.o., female MRN: [id number] DOB: [birth year]  PREOP DIAGNOSIS:  LEFT BREAST CANCER, COMPLETION OF CHEMOTHERAPY, completion of chemotherapy  POSTOP DIAGNOSIS:   LEFT BREAST CANCER, COMPLETION OF CHEMOTHERAPY, completion of chemotherapy  PROCEDURE:   Procedure(s):  REMOVAL PORT-A-CATH  SURGEON:   Alphonsa Overall, M.D.  ANESTHESIA:  MAC       15 cc of 1%  xylocaine  COUNTS CORRECT:  YES  INDICATIONS FOR PROCEDURE:  Veronica Velazquez is a 54 y.o. (DOB: [birth year])  female whose primary care physician is Sagardia, Ines Bloomer, MD and comes for removal of a  power port that was placed for the treatment of left breast cancer.  Dr. Lindi Adie is her treating oncologist.  She has now completed her chemotherapy.   The indications and risks of the surgery were explained to the patient.  The risks include, but are not limited to, infection, bleeding, and nerve injury.  OPERATIVE NOTE:  The patient was taken to OR room # 4 at [facility]  Her right chest was prepped with chloroprep and sterilely draped.   A time out was held and the surgery checklist reviewed.   The patient was placed in a supine position.  I infiltrated 15 cc of local around the power port.   I made an incision through the prior power port incision.   I cut down to the tubing and removed this intact.  I then removed the port intact.  There was no bleeding.   The wounds were then closed with 3-0 vicryl subcutaneous sutures and the skin closed with a 4-0 Monocryl suture.  The skin was painted with Dermabond.   The patient was transferred to the recovery room in good condition.  The sponge and needle count were correct at the end of the case.   Alphonsa Overall, MD, [facility] Surgery Pager: [phone] Office phone:  [phone]

## 2020-08-19 NOTE — Transfer of Care (Signed)
Immediate Anesthesia Transfer of Care Note  Patient: Veronica Velazquez  Procedure(s) Performed: REMOVAL PORT-A-CATH (N/A Breast)  Patient Location: PACU  Anesthesia Type:MAC  Level of Consciousness: awake, alert , oriented and patient cooperative  Airway & Oxygen Therapy: Patient Spontanous Breathing and Patient connected to face mask oxygen  Post-op Assessment: Report given to RN, Post -op Vital signs reviewed and stable and Patient moving all extremities  Post vital signs: Reviewed and stable  Last Vitals:  Vitals Value Taken Time  BP 114/70 08/19/20 0851  Temp    Pulse 62 08/19/20 0852  Resp 17 08/19/20 0852  SpO2 100 % 08/19/20 0852  Vitals shown include unvalidated device data.  Last Pain:  Vitals:   08/19/20 0705  TempSrc: Oral  PainSc:       Patients Stated Pain Goal: 4 (94/76/54 [id number])  Complications: No complications documented.

## 2020-08-19 NOTE — Interval H&P Note (Signed)
History and Physical Interval Note:  08/19/2020 7:51 AM  [place]  has presented today for surgery, with the diagnosis of LEFT BREAST CANCER, COMPLETION OF CHEMOTHERAPY.  The various methods of treatment have been discussed with the patient and family.   The daughter is in the room with the patient.  After consideration of risks, benefits and other options for treatment, the patient has consented to  Procedure(s): REMOVAL PORT-A-CATH (N/A) as a surgical intervention.  The patient's history has been reviewed, patient examined, no change in status, stable for surgery.  I have reviewed the patient's chart and labs.  Questions were answered to the patient's satisfaction.     Shann Medal

## 2020-08-19 NOTE — Discharge Instructions (Signed)
[facility] - DISCHARGE INSTRUCTIONS TO PATIENT  Activity:  Lifting - No limits                       Practice your Covid-19 protection:  Wear a mask, social distance, and wash your hands frequently  Wound Care:   Leave the incision dry for 2 days, then you may shower  Diet:  As tolerated  Follow up appointment:  Call [facility] at [phone] for an appointment in 6 months for follow u of the cancer.  Medications and dosages:  Resume your home medications.             You may also take Tylenol, ibuprofen, or Aleve for pain  Call Dr. Lucia Gaskins or his office  [phone]) if you have:  Temperature greater than 100.4,  Redness, tenderness, or signs of infection (pain, swelling, redness, odor or green/yellow discharge around the site),  Any other questions or concerns you may have after discharge.  In an emergency, call 911 or go to an Emergency Department at a nearby hospital.

## 2020-08-20 ENCOUNTER — Encounter (HOSPITAL_COMMUNITY): Payer: Self-pay | Admitting: Surgery

## 2020-09-23 ENCOUNTER — Other Ambulatory Visit: Payer: Self-pay

## 2020-09-23 ENCOUNTER — Ambulatory Visit
Admission: RE | Admit: 2020-09-23 | Discharge: 2020-09-23 | Disposition: A | Discharge status: Home / Self Care | Payer: BC Managed Care – PPO | Source: Ambulatory Visit | Attending: Hematology and Oncology | Admitting: Hematology and Oncology

## 2020-09-23 DIAGNOSIS — Z853 Personal history of malignant neoplasm of breast: Secondary | ICD-10-CM | POA: Diagnosis not present

## 2020-09-23 DIAGNOSIS — R928 Other abnormal and inconclusive findings on diagnostic imaging of breast: Secondary | ICD-10-CM | POA: Diagnosis not present

## 2020-09-23 DIAGNOSIS — C50412 Malignant neoplasm of upper-outer quadrant of left female breast: Secondary | ICD-10-CM

## 2020-10-08 ENCOUNTER — Other Ambulatory Visit: Payer: Self-pay

## 2020-10-08 ENCOUNTER — Ambulatory Visit: Payer: BC Managed Care – PPO | Admitting: Emergency Medicine

## 2020-10-08 ENCOUNTER — Ambulatory Visit (INDEPENDENT_AMBULATORY_CARE_PROVIDER_SITE_OTHER): Payer: BC Managed Care – PPO | Admitting: Family Medicine

## 2020-10-08 ENCOUNTER — Encounter: Payer: Self-pay | Admitting: Family Medicine

## 2020-10-08 VITALS — BP 138/85 | HR 67 | Temp 97.8°F | Ht 64.0 in | Wt 254.0 lb

## 2020-10-08 DIAGNOSIS — E039 Hypothyroidism, unspecified: Secondary | ICD-10-CM

## 2020-10-08 DIAGNOSIS — Z114 Encounter for screening for human immunodeficiency virus [HIV]: Secondary | ICD-10-CM | POA: Diagnosis not present

## 2020-10-08 DIAGNOSIS — D509 Iron deficiency anemia, unspecified: Secondary | ICD-10-CM | POA: Diagnosis not present

## 2020-10-08 DIAGNOSIS — Z1159 Encounter for screening for other viral diseases: Secondary | ICD-10-CM | POA: Diagnosis not present

## 2020-10-08 DIAGNOSIS — E785 Hyperlipidemia, unspecified: Secondary | ICD-10-CM

## 2020-10-08 DIAGNOSIS — I10 Essential (primary) hypertension: Secondary | ICD-10-CM

## 2020-10-08 DIAGNOSIS — J4520 Mild intermittent asthma, uncomplicated: Secondary | ICD-10-CM

## 2020-10-08 DIAGNOSIS — E118 Type 2 diabetes mellitus with unspecified complications: Secondary | ICD-10-CM | POA: Insufficient documentation

## 2020-10-08 DIAGNOSIS — E119 Type 2 diabetes mellitus without complications: Secondary | ICD-10-CM | POA: Diagnosis not present

## 2020-10-08 DIAGNOSIS — Z1211 Encounter for screening for malignant neoplasm of colon: Secondary | ICD-10-CM

## 2020-10-08 LAB — POCT GLYCOSYLATED HEMOGLOBIN (HGB A1C): Hemoglobin A1C: 7.5 % — AB (ref 4.0–5.6)

## 2020-10-08 MED ORDER — LISINOPRIL 20 MG PO TABS: 30.0000 mg | ORAL_TABLET | Freq: Every day | ORAL | 3 refills | Status: DC

## 2020-10-08 MED ORDER — ROSUVASTATIN CALCIUM 10 MG PO TABS: 10.0000 mg | ORAL_TABLET | Freq: Every day | ORAL | 3 refills | Status: DC

## 2020-10-08 MED ORDER — ALBUTEROL SULFATE HFA 108 (90 BASE) MCG/ACT IN AERS: 1.0000 | INHALATION_SPRAY | RESPIRATORY_TRACT | 7 refills | Status: DC | PRN

## 2020-10-08 MED ORDER — METFORMIN HCL 500 MG PO TABS: 500.0000 mg | ORAL_TABLET | Freq: Two times a day (BID) | ORAL | 3 refills | Status: DC

## 2020-10-08 NOTE — Patient Instructions (Addendum)
Needs Tetanus vaccination, Pneumonia Vaccination (can get at pharmacy if you want)  Discuss with her about colonoscopy (colon cancer screen) They will call you to schedule.  Needs eye exam due to diabetes. They will call you to schedule.  Increase Lisinopril to 30 mg (take 1.5 tabs)  Will message you tomorrow about your thyroid medication  Continue ferrous gluconate 324mg  twice a day  Starting Metformin 500mg  twice a day for Diabetes  Take 2 simvastatin per day until bottle is empty and then pick up Crestor (Rosuvastatin ) from pharmacy and take 1 per day    Colorectal Cancer Screening  Colorectal cancer screening is a group of tests that are used to check for colorectal cancer before symptoms develop. Colorectal refers to the colon and rectum. The colon and rectum are located at the end of the digestive tract and carry bowel movements out of the body. Who should have screening? All adults starting at age 73 until age 77 should have screening. Your health care provider may recommend screening at age 71. You will have tests every 1-10 years, depending on your results and the type of screening test. You may have screening tests starting at an earlier age, or more frequently than other people, if you have any of the following risk factors:  A personal or family history of colorectal cancer or abnormal growths (polyps).  Inflammatory bowel disease, such as ulcerative colitis or Crohn's disease.  A history of having radiation treatment to the abdomen or pelvic area for cancer.  Colorectal cancer symptoms, such as changes in bowel habits or blood in your stool.  A type of colon cancer syndrome that is passed from parent to child (hereditary), such as: ? Lynch syndrome. ? Familial adenomatous polyposis. ? Turcot syndrome. ? Peutz-Jeghers syndrome. Screening recommendations for adults who are 59-57 years old vary depending on health. How is screening done? There are several types of  colorectal screening tests. You may have one or more of the following:  Guaiac-based fecal occult blood testing. For this test, a stool (feces) sample is checked for hidden (occult) blood, which could be a sign of colorectal cancer.  Fecal immunochemical test (FIT). For this test, a stool sample is checked for blood, which could be a sign of colorectal cancer.  Stool DNA test. For this test, a stool sample is checked for blood and changes in DNA that could lead to colorectal cancer.  Sigmoidoscopy. During this test, a thin, flexible tube with a camera on the end (sigmoidoscope) is used to examine the rectum and the lower colon.  Colonoscopy. During this test, a long, flexible tube with a camera on the end (colonoscope) is used to examine the entire colon and rectum. With a colonoscopy, it is possible to take a sample of tissue (biopsy) and remove small polyps during the test.  Virtual colonoscopy. Instead of a colonoscope, this type of colonoscopy uses X-rays (CT scan) and computers to produce images of the colon and rectum. What are the benefits of screening? Screening reduces your risk for colorectal cancer and can help identify cancer at an early stage, when the cancer can be removed or treated more easily. It is common for polyps to form in the lining of the colon, especially as you age. These polyps may be cancerous or become cancerous over time. Screening can identify these polyps. What are the risks of screening? Each screening test may have different risks.  Stool sample tests have fewer risks than other types of screening tests.  However, you may need more tests to confirm results from a stool sample test.  Screening tests that involve X-rays expose you to low levels of radiation, which may slightly increase your cancer risk. The benefit of detecting cancer outweighs the slight increase in risk.  Screening tests such as sigmoidoscopy and colonoscopy may place you at risk for bleeding,  intestinal damage, infection, or a reaction to medicines given during the exam. Talk with your health care provider to understand your risk for colorectal cancer and to make a screening plan that is right for you. Questions to ask your health care provider  When should I start colorectal cancer screening?  What is my risk for colorectal cancer?  How often do I need screening?  Which screening tests do I need?  How do I get my test results?  What do my results mean? Where to find more information Learn more about colorectal cancer screening from:  The [place]: www.cancer.org  The [employer]: www.cancer.gov Summary  Colorectal cancer screening is a group of tests used to check for colorectal cancer before symptoms develop.  Screening reduces your risk for colorectal cancer and can help identify cancer at an early stage, when the cancer can be removed or treated more easily.  All adults starting at age 26 until age 67 should have screening. Your health care provider may recommend screening at age 68.  You may have screening tests starting at an earlier age, or more frequently than other people, if you have certain risk factors.  Talk with your health care provider to understand your risk for colorectal cancer and to make a screening plan that is right for you. This information is not intended to replace advice given to you by your health care provider. Make sure you discuss any questions you have with your health care provider. Document Revised: 01/16/2019 Document Reviewed: 06/28/2017 Elsevier Patient Education  [place]. Pneumococcal Conjugate Vaccine (PCV13): What You Need to Know 1. Why get vaccinated? Pneumococcal conjugate vaccine (PCV13) can prevent pneumococcal disease. Pneumococcal disease refers to any illness caused by pneumococcal bacteria. These bacteria can cause many types of illnesses, including pneumonia, which is an  infection of the lungs. Pneumococcal bacteria are one of the most common causes of pneumonia. Besides pneumonia, pneumococcal bacteria can also cause:  Ear infections  Sinus infections  Meningitis (infection of the tissue covering the brain and spinal cord)  Bacteremia (bloodstream infection) Anyone can get pneumococcal disease, but children under 76 years of age, people with certain medical conditions, adults 53 years or older, and cigarette smokers are at the highest risk. Most pneumococcal infections are mild. However, some can result in long-term problems, such as brain damage or hearing loss. Meningitis, bacteremia, and pneumonia caused by pneumococcal disease can be fatal. 2. PCV13 PCV13 protects against 13 types of bacteria that cause pneumococcal disease. Infants and young children usually need 4 doses of pneumococcal conjugate vaccine, at 2, 4, 6, and 49-29 months of age. In some cases, a child might need fewer than 4 doses to complete PCV13 vaccination. A dose of PCV23 vaccine is also recommended for anyone 2 years or older with certain medical conditions if they did not already receive PCV13. This vaccine may be given to adults 76 years or older based on discussions between the patient and health care provider. 3. Talk with your health care provider Tell your vaccine provider if the person getting the vaccine:  Has had an allergic reaction after a  previous dose of PCV13, to an earlier pneumococcal conjugate vaccine known as PCV7, or to any vaccine containing diphtheria toxoid (for example, DTaP), or has any severe, life-threatening allergies.  In some cases, your health care provider may decide to postpone PCV13 vaccination to a future visit. People with minor illnesses, such as a cold, may be vaccinated. People who are moderately or severely ill should usually wait until they recover before getting PCV13. Your health care provider can give you more information. 4. Risks of a  vaccine reaction  Redness, swelling, pain, or tenderness where the shot is given, and fever, loss of appetite, fussiness (irritability), feeling tired, headache, and chills can happen after PCV13. Young children may be at increased risk for seizures caused by fever after PCV13 if it is administered at the same time as inactivated influenza vaccine. Ask your health care provider for more information. People sometimes faint after medical procedures, including vaccination. Tell your provider if you feel dizzy or have vision changes or ringing in the ears. As with any medicine, there is a very remote chance of a vaccine causing a severe allergic reaction, other serious injury, or death. 5. What if there is a serious problem? An allergic reaction could occur after the vaccinated person leaves the clinic. If you see signs of a severe allergic reaction (hives, swelling of the face and throat, difficulty breathing, a fast heartbeat, dizziness, or weakness), call 9-1-1 and get the person to the nearest hospital. For other signs that concern you, call your health care provider. Adverse reactions should be reported to the Vaccine Adverse Event Reporting System (VAERS). Your health care provider will usually file this report, or you can do it yourself. Visit the VAERS website at www.vaers.SamedayNews.es or call 530-188-5209. VAERS is only for reporting reactions, and VAERS staff do not give medical advice. 6. The National Vaccine Injury Compensation Program The [employer] Vaccine Injury Compensation Program (VICP) is a federal program that was created to compensate people who may have been injured by certain vaccines. Visit the VICP website at GoldCloset.com.ee or call 6573839295 to learn about the program and about filing a claim. There is a time limit to file a claim for compensation. 7. How can I learn more?  Ask your health care provider.  Call your local or state health department.  Contact  the Centers for Disease Control and Prevention (CDC): ? Call 737-448-3137 (1-800-CDC-INFO) or ? Visit CDC's website at http://hunter.com/ Vaccine Information Statement PCV13 Vaccine (08/08/2018) This information is not intended to replace advice given to you by your health care provider. Make sure you discuss any questions you have with your health care provider. Document Revised: 01/15/2019 Document Reviewed: 05/08/2018 Elsevier Patient Education  [place]. https://www.cdc.gov/vaccines/hcp/vis/vis-statements/tdap.pdf">  Tdap (Tetanus, Diphtheria, Pertussis) Vaccine: What You Need to Know 1. Why get vaccinated? Tdap vaccine can prevent tetanus, diphtheria, and pertussis. Diphtheria and pertussis spread from person to person. Tetanus enters the body through cuts or wounds.  TETANUS (T) causes painful stiffening of the muscles. Tetanus can lead to serious health problems, including being unable to open the mouth, having trouble swallowing and breathing, or death.  DIPHTHERIA (D) can lead to difficulty breathing, heart failure, paralysis, or death.  PERTUSSIS (aP), also known as "whooping cough," can cause uncontrollable, violent coughing which makes it hard to breathe, eat, or drink. Pertussis can be extremely serious in babies and young children, causing pneumonia, convulsions, brain damage, or death. In teens and adults, it can cause weight loss, loss of bladder  control, passing out, and rib fractures from severe coughing. 2. Tdap vaccine Tdap is only for children 7 years and older, adolescents, and adults.  Adolescents should receive a single dose of Tdap, preferably at age 68 or 12 years. Pregnant women should get a dose of Tdap during every pregnancy, to protect the newborn from pertussis. Infants are most at risk for severe, life-threatening complications from pertussis. Adults who have never received Tdap should get a dose of Tdap. Also, adults should receive a booster dose  every 10 years, or earlier in the case of a severe and dirty wound or burn. Booster doses can be either Tdap or Td (a different vaccine that protects against tetanus and diphtheria but not pertussis). Tdap may be given at the same time as other vaccines. 3. Talk with your health care provider Tell your vaccine provider if the person getting the vaccine:  Has had an allergic reaction after a previous dose of any vaccine that protects against tetanus, diphtheria, or pertussis, or has any severe, life-threatening allergies.  Has had a coma, decreased level of consciousness, or prolonged seizures within 7 days after a previous dose of any pertussis vaccine (DTP, DTaP, or Tdap).  Has seizures or another nervous system problem.  Has ever had Guillain-Barr Syndrome (also called GBS).  Has had severe pain or swelling after a previous dose of any vaccine that protects against tetanus or diphtheria. In some cases, your health care provider may decide to postpone Tdap vaccination to a future visit.  People with minor illnesses, such as a cold, may be vaccinated. People who are moderately or severely ill should usually wait until they recover before getting Tdap vaccine.  Your health care provider can give you more information. 4. Risks of a vaccine reaction  Pain, redness, or swelling where the shot was given, mild fever, headache, feeling tired, and nausea, vomiting, diarrhea, or stomachache sometimes happen after Tdap vaccine. People sometimes faint after medical procedures, including vaccination. Tell your provider if you feel dizzy or have vision changes or ringing in the ears.  As with any medicine, there is a very remote chance of a vaccine causing a severe allergic reaction, other serious injury, or death. 5. What if there is a serious problem? An allergic reaction could occur after the vaccinated person leaves the clinic. If you see signs of a severe allergic reaction (hives, swelling of the  face and throat, difficulty breathing, a fast heartbeat, dizziness, or weakness), call 9-1-1 and get the person to the nearest hospital. For other signs that concern you, call your health care provider.  Adverse reactions should be reported to the Vaccine Adverse Event Reporting System (VAERS). Your health care provider will usually file this report, or you can do it yourself. Visit the VAERS website at www.vaers.LAgents.no or call 641-389-4270. VAERS is only for reporting reactions, and VAERS staff do not give medical advice. 6. The National Vaccine Injury Compensation Program The [employer] Vaccine Injury Compensation Program (VICP) is a federal program that was created to compensate people who may have been injured by certain vaccines. Visit the VICP website at SpiritualWord.at or call 223-502-3785 to learn about the program and about filing a claim. There is a time limit to file a claim for compensation. 7. How can I learn more?  Ask your health care provider.  Call your local or state health department.  Contact the Centers for Disease Control and Prevention (CDC): ? Call (903)455-5446 (1-800-CDC-INFO) or ? Visit CDC's website at PicCapture.uy Vaccine  Information Statement Tdap (Tetanus, Diphtheria, Pertussis) Vaccine (01/09/2019) This information is not intended to replace advice given to you by your health care provider. Make sure you discuss any questions you have with your health care provider. Document Revised: 01/18/2019 Document Reviewed: 01/21/2019 Elsevier Patient Education  [employer].   If you have lab work done today you will be contacted with your lab results within the next 2 weeks.  If you have not heard from [place] then please contact us. The fastest way to get your results is to register for My Chart.   IF you received an x-ray today, you will receive an invoice from [facility]. Please contact [facility] at [phone] with  questions or concerns regarding your invoice.   IF you received labwork today, you will receive an invoice from [facility]. Please contact [facility] at [phone] with questions or concerns regarding your invoice.   Our billing staff will not be able to assist you with questions regarding bills from these companies.  You will be contacted with the lab results as soon as they are available. The fastest way to get your results is to activate your My Chart account. Instructions are located on the last page of this paperwork. If you have not heard from [place] regarding the results in 2 weeks, please contact this office.

## 2020-10-08 NOTE — Progress Notes (Addendum)
12/30/20216:49 PM  [place] [birth year], 54 y.o., female [id number]  Chief Complaint  Patient presents with  . Asthma    Medication refill   . Hypertension  . Hypothyroidism  . Hyperlipidemia    HPI:   Patient is a 54 y.o. female with past medical history significant for HTN, Hypothyroid, Asthma, DM, and HLD who presents today for routine follow up.  Tetanus:Declines Flu: Declined Pneumonia:Declines Pap: 2008? (Total hysterectomy including cervix)  Left Mastectomy Colonoscopy: Declines at this time, will place a referral Mammogram: 09/23/20  HTN Atenolol-chlorthalidone 100-13m: .5 tab daily Lisinopril 20 mg daily Goal BP < 130/80  BP Readings from Last 3 Encounters:  10/08/20 138/85  08/19/20 111/71  08/10/20 140/87    Hypothyroid Levothyroxine 100 mcg daily Lab Results  Component Value Date   TSH 7.130 (H) 08/13/2019    Asthma Albuterol PRN Uses approx twice a week   Anemia Ferrous gluconate 3231mbid Lab Results  Component Value Date   WBC 8.0 08/10/2020   HGB 11.6 (L) 08/10/2020   HCT 38.4 08/10/2020   MCV 74.7 (L) 08/10/2020   PLT 382 08/10/2020   Lab Results  Component Value Date   IRON 28 (L) 07/08/2020   TIBC 430 07/08/2020   FERRITIN 7 (L) 07/08/2020    DM Eye exam: Declines  Lab Results  Component Value Date   HGBA1C 7.5 (A) 10/08/2020     HLD Simvastatin 20 mg daily Lab Results  Component Value Date   CHOL 248 (H) 02/01/2018   HDL 59 02/01/2018   LDLCALC 165 (H) 02/01/2018   LDLDIRECT 162 (H) 01/25/2014   TRIG 120 02/01/2018   CHOLHDL 4.2 02/01/2018   The 10-year ASCVD risk score (GMikey BussingC Jr., et al., 2013) is: 7.6%   Values used to calculate the score:     Age: [age]     Sex: Female     Is Non-Hispanic African American: No     Diabetic: Yes     Tobacco smoker: No     Systolic Blood Pressure: 13628mHg     Is BP treated: Yes     HDL Cholesterol: 59 mg/dL     Total Cholesterol: 248  mg/dL   [facility] 10/08/2020 01/28/2020 08/13/2019  Decreased Interest 0 0 0  Down, Depressed, Hopeless 0 0 1  PHQ - 2 Score 0 0 1  Altered sleeping - - 0  Tired, decreased energy - - 0  Change in appetite - - 0  Feeling bad or failure about yourself  - - 0  Trouble concentrating - - 0  Moving slowly or fidgety/restless - - 0  Suicidal thoughts - - 0  PHQ-9 Score - - 1    Fall Risk  10/08/2020 01/28/2020 08/13/2019 07/25/2019 12/22/2018  Falls in the past year? 0 0 0 0 0  Number falls in past yr: 0 - 0 - 0  Injury with Fall? 0 - - - 0  Risk for fall due to : - - History of fall(s) - -  Follow up Falls evaluation completed - Falls evaluation completed Falls evaluation completed Falls evaluation completed     No Known Allergies  Prior to Admission medications   Medication Sig Start Date End Date Taking? Authorizing Provider  albuterol (VENTOLIN HFA) 108 (90 Base) MCG/ACT inhaler Inhale 1-2 puffs into the lungs every 4 (four) hours as needed for wheezing or shortness of breath (cough, shortness of breath or wheezing.). 04/07/20   SaAgustina Caroli  Jose, MD  atenolol-chlorthalidone (TENORETIC) 100-25 MG tablet Take 0.5 tablets by mouth daily. 04/07/20 08/05/20  Horald Pollen, MD  ferrous gluconate (FERGON) 324 MG tablet Take 1 tablet (324 mg total) by mouth 2 (two) times daily with a meal. 07/09/20   Nicholas Lose, MD  levothyroxine (SYNTHROID) 100 MCG tablet Take 1 tablet (100 mcg total) by mouth daily. Patient taking differently: Take 100 mcg by mouth daily before breakfast.  04/07/20 08/05/20  Horald Pollen, MD  lisinopril (ZESTRIL) 20 MG tablet Take 1 tablet (20 mg total) by mouth daily. 04/07/20   Horald Pollen, MD  simvastatin (ZOCOR) 20 MG tablet Take 1 tablet (20 mg total) by mouth daily. 04/07/20 08/19/20  Horald Pollen, MD  prochlorperazine (COMPAZINE) 10 MG tablet Take 1 tablet (10 mg total) by mouth every 6 (six) hours as needed (Nausea  or vomiting). 10/02/19 12/11/19  Nicholas Lose, MD    Past Medical History:  Diagnosis Date  . Anemia   . Asthma   . Breast mass, left 08/13/2019  . Cancer ([place]) 09/2019   left breast cancer  . Dyspnea    with exertion   . Hyperglycemia   . Hypertension   . Hypothyroidism   . Microcytosis   . Obesity   . Personal history of chemotherapy   . Pre-diabetes   . Prediabetes   . Trapezius muscle spasm 08/09/2019    Past Surgical History:  Procedure Laterality Date  . ABDOMINAL HYSTERECTOMY    . BREAST BIOPSY Left 09/24/2019  . BREAST LUMPECTOMY Left 01/07/2020  . BREAST LUMPECTOMY WITH RADIOACTIVE SEED AND SENTINEL LYMPH NODE BIOPSY Left 01/07/2020   Procedure: LEFT BREAST LUMPECTOMY WITH RADIOACTIVE SEED AND LEFT AXILLARY SENTINEL LYMPH NODE BIOPSY;  Surgeon: Alphonsa Overall, MD;  Location: [facility];  Service: General;  Laterality: Left;  . PORT-A-CATH REMOVAL N/A 08/19/2020   Procedure: REMOVAL PORT-A-CATH;  Surgeon: Alphonsa Overall, MD;  Location: [facility];  Service: General;  Laterality: N/A;  . PORTACATH PLACEMENT Right 10/15/2019   Procedure: INSERTION PORT-A-CATH WITH ULTRASOUND;  Surgeon: Alphonsa Overall, MD;  Location: [place];  Service: General;  Laterality: Right;    Social History   Tobacco Use  . Smoking status: Never Smoker  . Smokeless tobacco: Never Used  Substance Use Topics  . Alcohol use: No    Family History  Problem Relation Age of Onset  . Diabetes Brother        drug-induced    Review of Systems  Constitutional: Negative for chills, fever and malaise/fatigue.  Eyes: Negative for blurred vision and double vision.  Respiratory: Negative for cough, shortness of breath and wheezing.   Cardiovascular: Negative for chest pain, palpitations and leg swelling.  Gastrointestinal: Positive for constipation (Every other day, hard). Negative for abdominal pain, blood in stool, diarrhea, heartburn, nausea and vomiting.  Genitourinary:  Positive for frequency. Negative for dysuria, hematuria and urgency.  Musculoskeletal: Negative for back pain and joint pain.  Skin: Negative for rash.  Neurological: Negative for dizziness, weakness and headaches.     OBJECTIVE:  Today's Vitals   10/08/20 1558  BP: 138/85  Pulse: 67  Temp: 97.8 F (36.6 C)  SpO2: 97%  Weight: 254 lb (115.2 kg)  Height: 5' 4" (1.626 m)   Body mass index is 43.6 kg/m.   Physical Exam Constitutional:      General: She is not in acute distress.    Appearance: Normal appearance. She is not ill-appearing.  HENT:  Head: Normocephalic.  Cardiovascular:     Rate and Rhythm: Normal rate and regular rhythm.     Pulses: Normal pulses.     Heart sounds: Normal heart sounds. No murmur heard. No friction rub. No gallop.   Pulmonary:     Effort: Pulmonary effort is normal. No respiratory distress.     Breath sounds: Normal breath sounds. No stridor. No wheezing, rhonchi or rales.  Abdominal:     General: Bowel sounds are normal.     Palpations: Abdomen is soft.     Tenderness: There is no abdominal tenderness.  Musculoskeletal:     Right lower leg: No edema.     Left lower leg: No edema.  Skin:    General: Skin is warm and dry.  Neurological:     Mental Status: She is alert and oriented to person, place, and time.  Psychiatric:        Mood and Affect: Mood normal.        Behavior: Behavior normal.     Results for orders placed or performed in visit on 10/08/20 (from the past 24 hour(s))  POCT glycosylated hemoglobin (Hb A1C)     Status: Abnormal   Collection Time: 10/08/20  4:29 PM  Result Value Ref Range   Hemoglobin A1C 7.5 (A) 4.0 - 5.6 %   HbA1c POC (<> result, manual entry)     HbA1c, POC (prediabetic range)     HbA1c, POC (controlled diabetic range)      No results found.   ASSESSMENT and PLAN  Problem List Items Addressed This Visit      Cardiovascular and Mediastinum   Essential hypertension, benign - Primary    Relevant Medications   lisinopril (ZESTRIL) 20 MG tablet   rosuvastatin (CRESTOR) 10 MG tablet   Other Relevant Orders   CMP14+EGFR  Increased Lisinopril from 28m to 30 mg due to not at goal< 130/80     Respiratory   Asthma (Chronic)   Relevant Medications   albuterol (VENTOLIN HFA) 108 (90 Base) MCG/ACT inhaler      Endocrine   Hypothyroidism   Relevant Orders   TSH Last TSH not at goal< 4 Will follow up with lab results Current levothyroxine at 1033m    Type 2 diabetes mellitus without complication, without long-term current use of insulin (HCC)   Relevant Medications   lisinopril (ZESTRIL) 20 MG tablet   metFORMIN (GLUCOPHAGE) 500 MG tablet   rosuvastatin (CRESTOR) 10 MG tablet   Other Relevant Orders   POCT glycosylated hemoglobin (Hb A1C) (Completed)   Ambulatory referral to Ophthalmology  Started Metformin 50026mid F/u 3 months Discussed r/se/b of medications     Other   Hyperlipidemia   Relevant Medications   lisinopril (ZESTRIL) 20 MG tablet   rosuvastatin (CRESTOR) 10 MG tablet   Other Relevant Orders   Lipid Panel  Take 76m43mmvastatin daily Once run out start 10mg30mstor daily    Other Visit Diagnoses    Iron deficiency anemia, unspecified iron deficiency anemia type       Relevant Orders   CBC   Iron, TIBC and Ferritin Panel  Will follow up with lab results   Encounter for screening for HIV       Relevant Orders   HIV Antibody (routine testing w rflx)   Encounter for hepatitis C screening test for low risk patient       Relevant Orders   Hepatitis C antibody   Screen for colon cancer  Relevant Orders   Ambulatory referral to Gastroenterology       Return in about 3 months (around 01/06/2021) for chronic condition follow up.    Huston Foley Hernan Turnage, FNP-BC [facility] [place], [place] [address] Ph.  [phone] Fax [phone]  I have reviewed and agree with above documentation. Agustina Caroli, MD

## 2020-10-09 LAB — CMP14+EGFR
ALT: 12 IU/L (ref 0–32)
AST: 13 IU/L (ref 0–40)
Albumin/Globulin Ratio: 1.4 (ref 1.2–2.2)
Albumin: 3.8 g/dL (ref 3.8–4.9)
Alkaline Phosphatase: 109 IU/L (ref 44–121)
BUN/Creatinine Ratio: 25 — ABNORMAL HIGH (ref 9–23)
BUN: 17 mg/dL (ref 6–24)
Bilirubin Total: <0.2 mg/dL (ref 0.0–1.2)
CO2: 24 mmol/L (ref 20–29)
Calcium: 9.7 mg/dL (ref 8.7–10.2)
Chloride: 100 mmol/L (ref 96–106)
Creatinine, Ser: 0.67 mg/dL (ref 0.57–1.00)
GFR calc Af Amer: 114 mL/min/{1.73_m2} (ref >59)
GFR calc non Af Amer: 99 mL/min/{1.73_m2} (ref >59)
Globulin, Total: 2.8 g/dL (ref 1.5–4.5)
Glucose: 127 mg/dL — ABNORMAL HIGH (ref 65–99)
Potassium: 4.1 mmol/L (ref 3.5–5.2)
Sodium: 136 mmol/L (ref 134–144)
Total Protein: 6.6 g/dL (ref 6.0–8.5)

## 2020-10-09 LAB — LIPID PANEL
Chol/HDL Ratio: 4.6 ratio — ABNORMAL HIGH (ref 0.0–4.4)
Cholesterol, Total: 222 mg/dL — ABNORMAL HIGH (ref 100–199)
HDL: 48 mg/dL (ref >39)
LDL Chol Calc (NIH): 135 mg/dL — ABNORMAL HIGH (ref 0–99)
Triglycerides: 220 mg/dL — ABNORMAL HIGH (ref 0–149)
VLDL Cholesterol Cal: 39 mg/dL (ref 5–40)

## 2020-10-09 LAB — TSH: TSH: 2.79 u[IU]/mL (ref 0.450–4.500)

## 2020-10-09 LAB — IRON,TIBC AND FERRITIN PANEL
Ferritin: 31 ng/mL (ref 15–150)
Iron Saturation: 58 % — ABNORMAL HIGH (ref 15–55)
Iron: 206 ug/dL — ABNORMAL HIGH (ref 27–159)
Total Iron Binding Capacity: 353 ug/dL (ref 250–450)
UIBC: 147 ug/dL (ref 131–425)

## 2020-10-09 LAB — HEPATITIS C ANTIBODY: Hep C Virus Ab: <0.1 s/co ratio (ref 0.0–0.9)

## 2020-10-09 LAB — CBC
Hematocrit: 39.8 % (ref 34.0–46.6)
Hemoglobin: 12.7 g/dL (ref 11.1–15.9)
MCH: 23.5 pg — ABNORMAL LOW (ref 26.6–33.0)
MCHC: 31.9 g/dL (ref 31.5–35.7)
MCV: 74 fL — ABNORMAL LOW (ref 79–97)
Platelets: 407 10*3/uL (ref 150–450)
RBC: 5.4 x10E6/uL — ABNORMAL HIGH (ref 3.77–5.28)
RDW: 17.7 % — ABNORMAL HIGH (ref 11.7–15.4)
WBC: 9.1 10*3/uL (ref 3.4–10.8)

## 2020-10-09 LAB — HIV ANTIBODY (ROUTINE TESTING W REFLEX): HIV Screen 4th Generation wRfx: NONREACTIVE

## 2020-10-12 LAB — MICROALBUMIN / CREATININE URINE RATIO
Creatinine, Urine: 79.7 mg/dL
Microalb/Creat Ratio: <4 mg/g creat (ref 0–29)
Microalbumin, Urine: <3 ug/mL

## 2020-11-12 ENCOUNTER — Other Ambulatory Visit: Payer: Self-pay

## 2020-11-12 ENCOUNTER — Other Ambulatory Visit: Payer: Self-pay | Admitting: Family Medicine

## 2020-11-12 DIAGNOSIS — J4520 Mild intermittent asthma, uncomplicated: Secondary | ICD-10-CM

## 2021-01-07 ENCOUNTER — Ambulatory Visit: Payer: BC Managed Care – PPO | Admitting: Emergency Medicine

## 2021-01-27 ENCOUNTER — Telehealth: Payer: Self-pay | Admitting: Hematology and Oncology

## 2021-01-27 NOTE — Telephone Encounter (Signed)
R/s appts per 4/20 sch msg. Pt's daughter is aware.

## 2021-01-29 ENCOUNTER — Encounter (HOSPITAL_COMMUNITY): Payer: Self-pay

## 2021-02-01 IMAGING — MG DIGITAL DIAGNOSTIC BILAT W/ TOMO W/ CAD
8 of 17 series · 8 of 40 positions shown · non-contrast
Comparison: None.

CLINICAL DATA: 53-year-old female presenting for evaluation of a
palpable lump left breast identified on self exam.

EXAM:
DIGITAL DIAGNOSTIC BILATERAL MAMMOGRAM WITH CAD AND TOMO
ULTRASOUND LEFT BREAST

[L XCCL synth-2D]
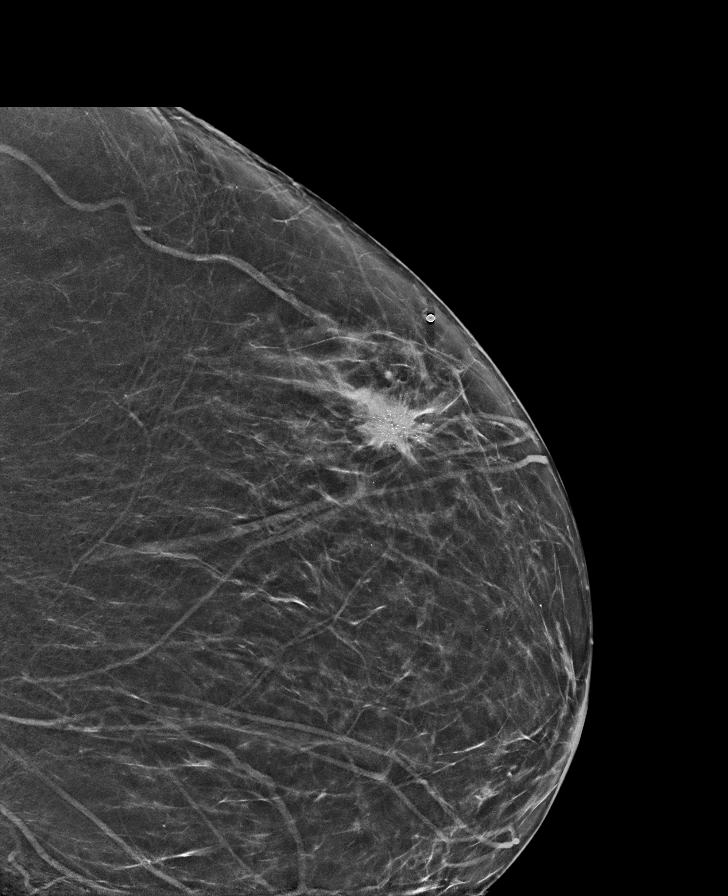

[R CC synth-2D (1 of 2)]
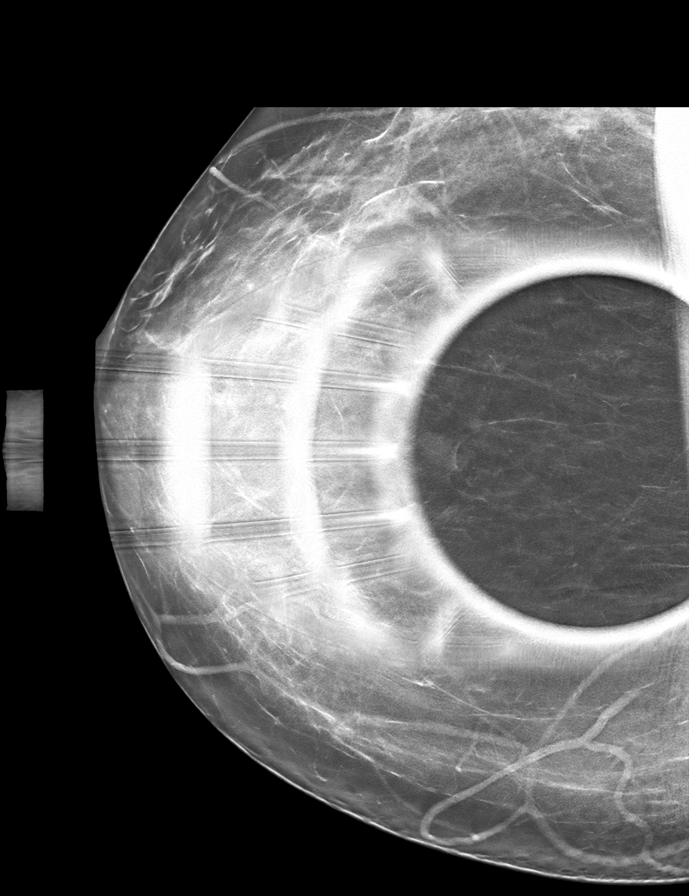

[L TAN synth-2D]
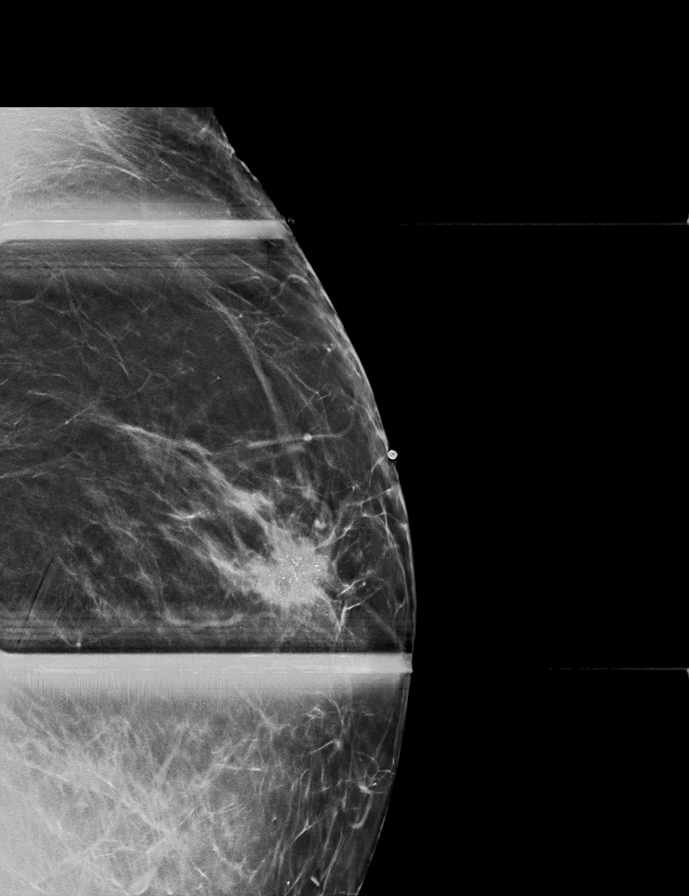

[R MLO synth-2D (1 of 2)]
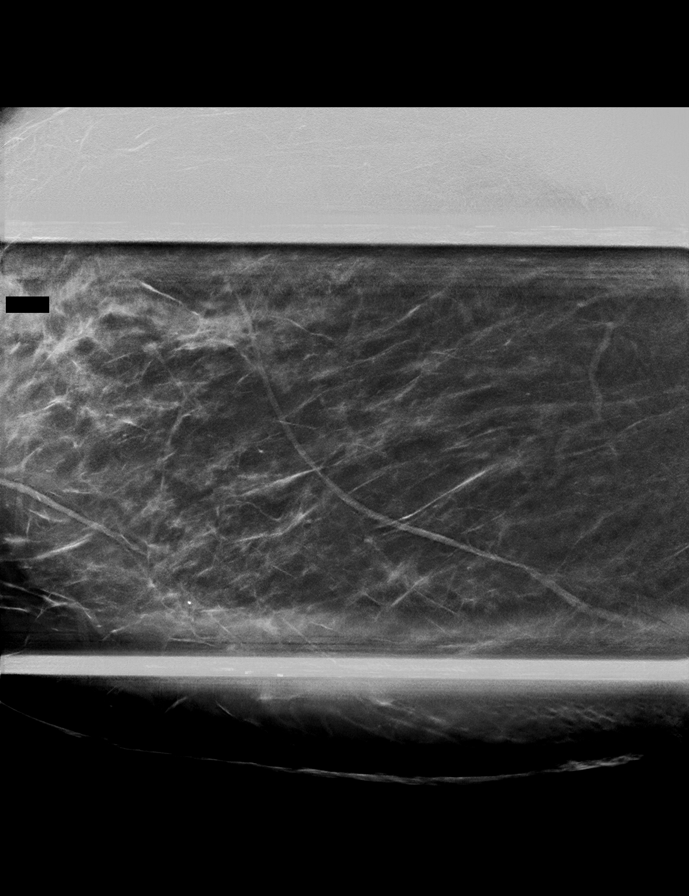

[R MLO synth-2D (2 of 2)]
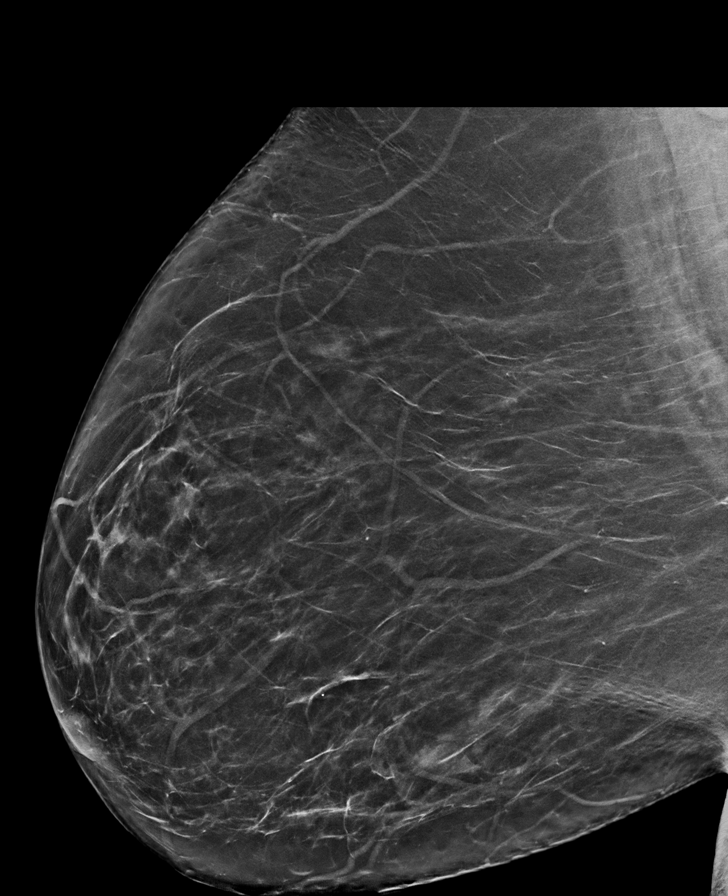

[R CC synth-2D (2 of 2)]
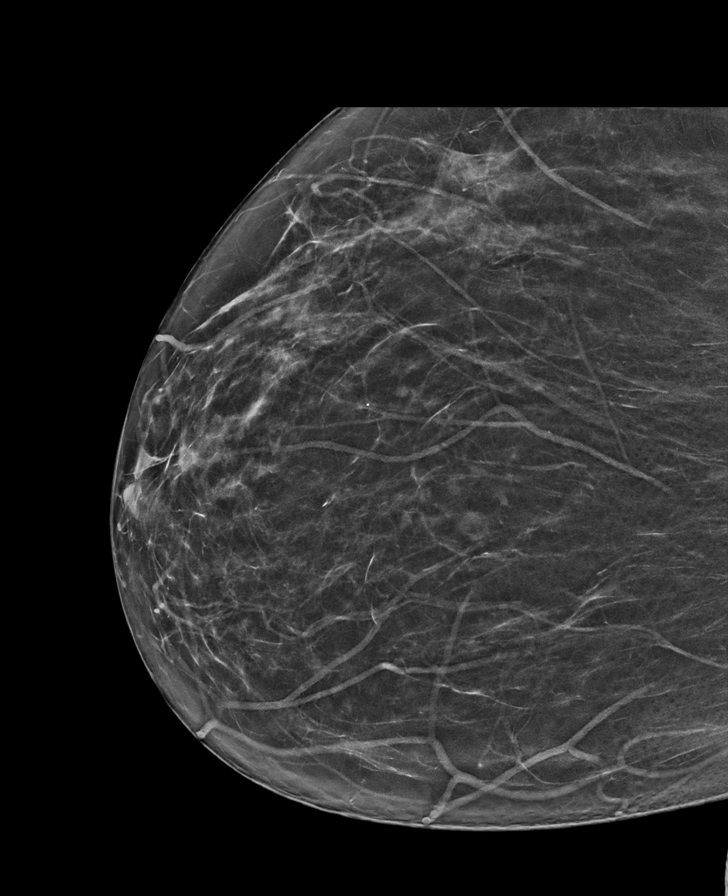

[L MLO synth-2D]
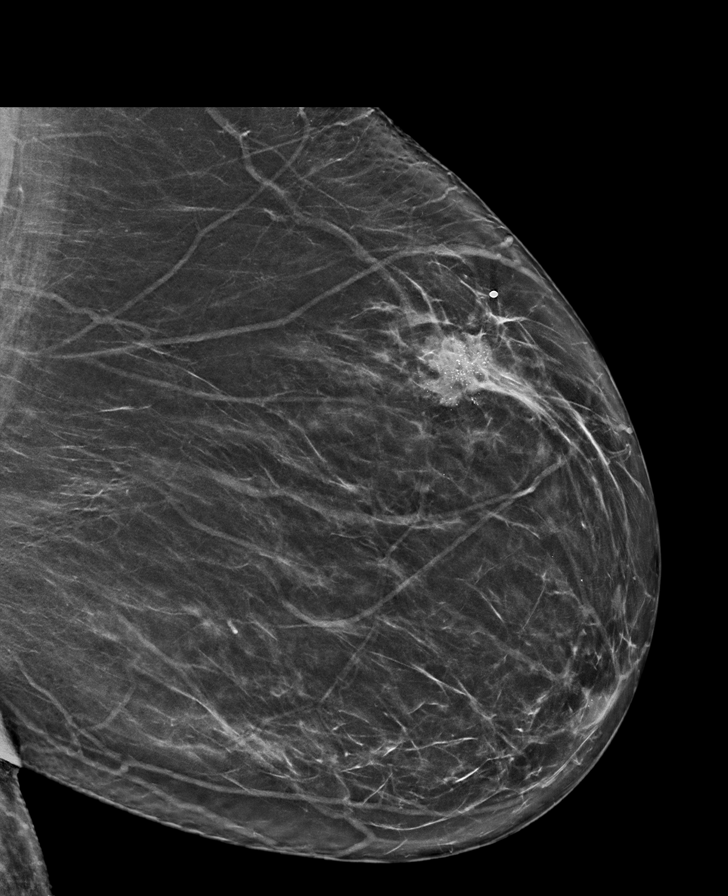

[L MLO tomo · tomo slice 41/80.0]
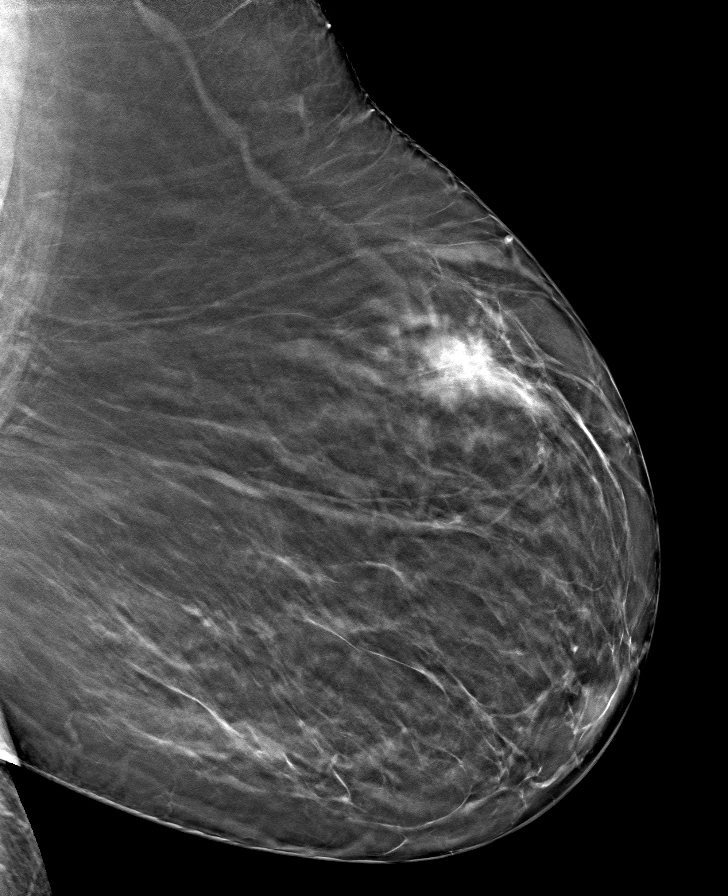

[8 of 40 positions shown; findings below may reference images not displayed]

ACR Breast Density Category b: There are scattered areas of
fibroglandular density.
FINDINGS: A BB has been placed on the upper-outer quadrant of the left breast
indicating the palpable site of concern. Deep to the palpable
marker, there is an irregular spiculated mass with associated
tightly clustered calcifications. In the inferior central right
breast there is a low-density asymmetry resolves on spot compression
tomosynthesis imaging.

Mammographic images were processed with CAD.

Ultrasound targeted to the left breast at 2 o'clock, 8 cm from the
nipple demonstrates in irregular hypoechoic mass with internal
calcifications measuring approximately 2.3 x 1.9 x 2.3 cm.
Ultrasound of the left axilla demonstrates multiple normal-appearing
lymph nodes.
IMPRESSION: 1. There is a highly suspicious mass at the palpable site in the
upper-outer quadrant of the left breast.

2.  No evidence of left axillary lymphadenopathy.

3.  No evidence of malignancy in the right breast.

RECOMMENDATION:
1. Ultrasound-guided biopsy is recommended for the left breast mass.
This has been scheduled for 09/24/2019 at [DATE] p.m.

I have discussed the findings and recommendations with the patient.
If applicable, a reminder letter will be sent to the patient
regarding the next appointment.

BI-RADS CATEGORY  5: Highly suggestive of malignancy.

## 2021-02-03 ENCOUNTER — Inpatient Hospital Stay: Payer: BC Managed Care – PPO | Admitting: Hematology and Oncology

## 2021-02-03 ENCOUNTER — Inpatient Hospital Stay: Payer: BC Managed Care – PPO

## 2021-02-14 NOTE — Progress Notes (Signed)
Patient Care Team: Horald Pollen, MD as PCP - General (Internal Medicine) Mauro Kaufmann, RN as Oncology Nurse Navigator Rockwell Germany, RN as Oncology Nurse Navigator Nicholas Lose, MD as Consulting Physician (Hematology and Oncology) Eppie Gibson, MD as Attending Physician (Radiation Oncology) Alphonsa Overall, MD as Consulting Physician (General Surgery)  DIAGNOSIS:    ICD-10-CM   1. Malignant neoplasm of upper-outer quadrant of left breast in female, estrogen receptor negative ([place])  C50.412 MM DIAG BREAST TOMO BILATERAL   Z17.1     SUMMARY OF ONCOLOGIC HISTORY: Oncology History  Malignant neoplasm of upper-outer quadrant of left breast in female, estrogen receptor negative ([place])  09/30/2019 Initial Diagnosis   Patient palpated left breast mass. Mammogram showed a 2.3cm mass at the 2 o'clock position, normal-appearing axillary lymph nodes. Biopsy showed IDC, grade 3, HER-2 + (3+), ER/PR -, Ki67 40%.   10/02/2019 Cancer Staging   Staging form: Breast, AJCC 8th Edition - Clinical stage from 10/02/2019: Stage IIA (cT2, cN0, cM0, G3, ER-, PR-, HER2+) - Signed by Nicholas Lose, MD on 10/02/2019   10/16/2019 - 12/10/2019 Chemotherapy   Taxol Herceptin neoadjuvant chemotherapy x5 (stopped early due to neuropathy toxicity)    Genetic Testing   Negative genetic testing. No pathogenic variants identified on the [facility] Common Hereditary Cancers Panel. The report date is 11/26/2019.  The Common Hereditary Cancers Panel offered by [facility] includes sequencing and/or deletion duplication testing of the following 48 genes: APC, ATM, AXIN2, BARD1, BMPR1A, BRCA1, BRCA2, BRIP1, CDH1, CDKN2A (p14ARF), CDKN2A (p16INK4a), CKD4, CHEK2, CTNNA1, DICER1, EPCAM (Deletion/duplication testing only), GREM1 (promoter region deletion/duplication testing only), KIT, MEN1, MLH1, MSH2, MSH3, MSH6, MUTYH, NBN, NF1, NHTL1, PALB2, PDGFRA, PMS2, POLD1, POLE, PTEN, RAD50, RAD51C, RAD51D, RNF43, SDHB, SDHC, SDHD,  SMAD4, SMARCA4. STK11, TP53, TSC1, TSC2, and VHL.  The following genes were evaluated for sequence changes only: SDHA and HOXB13 c.251G>A variant only.   12/11/2019 -  Chemotherapy   Herceptin maintenance   01/07/2020 Surgery   Left lumpectomy Lucia Gaskins): no residual carcinoma, clear margins, 3 left axillary lymph nodes negative.   02/18/2020 - 03/16/2020 Radiation Therapy   Adjuvant radiation     CHIEF COMPLIANT: Follow-up of left breast cancer  INTERVAL HISTORY: Veronica Velazquez is a 55 y.o. with above-mentioned history of left breast cancer whocompletedneoadjuvant chemotherapy,underwent a left lumpectomy,radiation,and completed adjuvant Herceptin maintenance.She also has a history of iron deficiency anemia. Mammogram om 09/23/20 showed no evidence of malignancy bilaterally. She presents to the clinic follow-up.  She has mild discomfort in the breast and itching sensation of the left breast. She has not been seen by her primary care physician and has not been monitoring her blood sugars.  He does me that she has not been taking metformin either.  ALLERGIES:  has No Known Allergies.  MEDICATIONS:  Current Outpatient Medications  Medication Sig Dispense Refill  . atenolol-chlorthalidone (TENORETIC) 100-25 MG tablet Take 0.5 tablets by mouth daily. 45 tablet 3  . ferrous gluconate (FERGON) 324 MG tablet Take 1 tablet (324 mg total) by mouth 2 (two) times daily with a meal. 180 tablet 3  . levothyroxine (SYNTHROID) 100 MCG tablet Take 1 tablet (100 mcg total) by mouth daily. (Patient taking differently: Take 100 mcg by mouth daily before breakfast.) 90 tablet 3  . lisinopril (ZESTRIL) 20 MG tablet Take 1.5 tablets (30 mg total) by mouth daily. 90 tablet 3  . metFORMIN (GLUCOPHAGE) 500 MG tablet Take 1 tablet (500 mg total) by mouth 2 (two) times daily with  a meal. 180 tablet 3  . rosuvastatin (CRESTOR) 10 MG tablet Take 1 tablet (10 mg total) by mouth daily. 90 tablet 3   No current  facility-administered medications for this visit.    PHYSICAL EXAMINATION: ECOG PERFORMANCE STATUS: 1 - Symptomatic but completely ambulatory  Vitals:   02/15/21 1158  BP: 139/85  Pulse: 63  Resp: 17  Temp: 97.9 F (36.6 C)  SpO2: 99%   Filed Weights   02/15/21 1158  Weight: 250 lb 6.4 oz (113.6 kg)    BREAST: No palpable masses or nodules in either right or left breasts. No palpable axillary supraclavicular or infraclavicular adenopathy no breast tenderness or nipple discharge. (exam performed in the presence of a chaperone)  LABORATORY DATA:  I have reviewed the data as listed CMP Latest Ref Rng & Units 02/15/2021 10/08/2020 08/10/2020  Glucose 70 - 99 mg/dL 145(H) 127(H) 121(H)  BUN 6 - 20 mg/dL $Remove'13 17 18  'OKXGnxL$ Creatinine 0.44 - 1.00 mg/dL 0.73 0.67 0.80  Sodium 135 - 145 mmol/L 139 136 140  Potassium 3.5 - 5.1 mmol/L 3.8 4.1 4.3  Chloride 98 - 111 mmol/L 102 100 103  CO2 22 - 32 mmol/L $RemoveB'27 24 24  'xnUOSlBa$ Calcium 8.9 - 10.3 mg/dL 9.5 9.7 9.6  Total Protein 6.5 - 8.1 g/dL 7.5 6.6 -  Total Bilirubin 0.3 - 1.2 mg/dL 0.3 <0.2 -  Alkaline Phos 38 - 126 U/L 107 109 -  AST 15 - 41 U/L 12(L) 13 -  ALT 0 - 44 U/L 11 12 -    Lab Results  Component Value Date   WBC 8.3 02/15/2021   HGB 13.1 02/15/2021   HCT 40.9 02/15/2021   MCV 77.5 (L) 02/15/2021   PLT 357 02/15/2021   NEUTROABS 5.5 02/15/2021    ASSESSMENT & PLAN:  Malignant neoplasm of upper-outer quadrant of left breast in female, estrogen receptor negative ([place]) 09/30/2019:Patient palpated left breast mass. Mammogram showed a 2.3cm mass at the 2 o'clock position, normal-appearing axillary lymph nodes. Biopsy showed IDC, grade 3, HER-2 + (3+), ER/PR -, Ki67 40%. T2 N0 stage IIa clinical stage  Treatment plan: 1.Neoadjuvant chemotherapy with Taxol-Herceptin weekly X5(neuropathy caused discontinuation of Taxol) followed by Herceptin maintenance for 1 year completed 07/29/2020 2.Breast conserving surgery with sentinel lymph  node biopsy(Dr.Newman) 01/07/20: Path CR, 0/3 LN 3.Adjuvant radiationstarted 02/18/2020-03/20/20 ----------------------------------------------------------------------------------------------------------------------------------------------------- Plan: Surveillance.  Microcytic anemia: Secondary to iron deficiency anemia: Currently on oral iron therapy and tolerating it fairly well.  She noticed that her energy levels are improving. I encouraged her to take vitamin D and a multivitamin.  Breast cancer surveillance:  1.  Breast exam 02/15/2021: Benign 2. Mammogram 09/23/2020: Benign breast density category B  Return to clinic in 1 year for follow-up.    Orders Placed This Encounter  Procedures  . MM DIAG BREAST TOMO BILATERAL    Standing Status:   Future    Standing Expiration Date:   02/15/2022    Scheduling Instructions:     Patients daughter will be the interpreter. (You don't need an interpreter)    Order Specific Question:   Reason for Exam (SYMPTOM  OR DIAGNOSIS REQUIRED)    Answer:   annual mammograms with H/O breast cancer    Order Specific Question:   Is the patient pregnant?    Answer:   No    Order Specific Question:   Preferred imaging location?    Answer:   [facility]    Order Specific Question:   Release to patient  Answer:   Immediate   The patient has a good understanding of the overall plan. she agrees with it. she will call with any problems that may develop before the next visit here.  Total time spent: 20 mins including face to face time and time spent for planning, charting and coordination of care  Rulon Eisenmenger, MD, MPH 02/15/2021  I, Cloyde Reams Dorshimer, am acting as scribe for Dr. Nicholas Lose.  I have reviewed the above documentation for accuracy and completeness, and I agree with the above.

## 2021-02-15 ENCOUNTER — Other Ambulatory Visit: Payer: Self-pay

## 2021-02-15 ENCOUNTER — Inpatient Hospital Stay: Payer: BC Managed Care – PPO | Attending: Hematology and Oncology

## 2021-02-15 ENCOUNTER — Inpatient Hospital Stay (HOSPITAL_BASED_OUTPATIENT_CLINIC_OR_DEPARTMENT_OTHER): Payer: BC Managed Care – PPO | Admitting: Hematology and Oncology

## 2021-02-15 DIAGNOSIS — C50412 Malignant neoplasm of upper-outer quadrant of left female breast: Secondary | ICD-10-CM

## 2021-02-15 DIAGNOSIS — Z171 Estrogen receptor negative status [ER-]: Secondary | ICD-10-CM | POA: Insufficient documentation

## 2021-02-15 DIAGNOSIS — D509 Iron deficiency anemia, unspecified: Secondary | ICD-10-CM

## 2021-02-15 DIAGNOSIS — Z79899 Other long term (current) drug therapy: Secondary | ICD-10-CM | POA: Insufficient documentation

## 2021-02-15 DIAGNOSIS — Z9221 Personal history of antineoplastic chemotherapy: Secondary | ICD-10-CM | POA: Insufficient documentation

## 2021-02-15 DIAGNOSIS — Z923 Personal history of irradiation: Secondary | ICD-10-CM | POA: Diagnosis not present

## 2021-02-15 LAB — CBC WITH DIFFERENTIAL (CANCER CENTER ONLY)
Abs Immature Granulocytes: 0.02 10*3/uL (ref 0.00–0.07)
Basophils Absolute: 0 10*3/uL (ref 0.0–0.1)
Basophils Relative: 1 %
Eosinophils Absolute: 0.3 10*3/uL (ref 0.0–0.5)
Eosinophils Relative: 3 %
HCT: 40.9 % (ref 36.0–46.0)
Hemoglobin: 13.1 g/dL (ref 12.0–15.0)
Immature Granulocytes: 0 %
Lymphocytes Relative: 22 %
Lymphs Abs: 1.8 10*3/uL (ref 0.7–4.0)
MCH: 24.8 pg — ABNORMAL LOW (ref 26.0–34.0)
MCHC: 32 g/dL (ref 30.0–36.0)
MCV: 77.5 fL — ABNORMAL LOW (ref 80.0–100.0)
Monocytes Absolute: 0.7 10*3/uL (ref 0.1–1.0)
Monocytes Relative: 8 %
Neutro Abs: 5.5 10*3/uL (ref 1.7–7.7)
Neutrophils Relative %: 66 %
Platelet Count: 357 10*3/uL (ref 150–400)
RBC: 5.28 MIL/uL — ABNORMAL HIGH (ref 3.87–5.11)
RDW: 15.2 % (ref 11.5–15.5)
WBC Count: 8.3 10*3/uL (ref 4.0–10.5)
nRBC: 0 % (ref 0.0–0.2)

## 2021-02-15 LAB — IRON AND TIBC
Iron: 44 ug/dL (ref 41–142)
Saturation Ratios: 11 % — ABNORMAL LOW (ref 21–57)
TIBC: 388 ug/dL (ref 236–444)
UIBC: 343 ug/dL (ref 120–384)

## 2021-02-15 LAB — CMP (CANCER CENTER ONLY)
ALT: 11 U/L (ref 0–44)
AST: 12 U/L — ABNORMAL LOW (ref 15–41)
Albumin: 3.4 g/dL — ABNORMAL LOW (ref 3.5–5.0)
Alkaline Phosphatase: 107 U/L (ref 38–126)
Anion gap: 10 (ref 5–15)
BUN: 13 mg/dL (ref 6–20)
CO2: 27 mmol/L (ref 22–32)
Calcium: 9.5 mg/dL (ref 8.9–10.3)
Chloride: 102 mmol/L (ref 98–111)
Creatinine: 0.73 mg/dL (ref 0.44–1.00)
GFR, Estimated: >60 mL/min (ref >60)
Glucose, Bld: 145 mg/dL — ABNORMAL HIGH (ref 70–99)
Potassium: 3.8 mmol/L (ref 3.5–5.1)
Sodium: 139 mmol/L (ref 135–145)
Total Bilirubin: 0.3 mg/dL (ref 0.3–1.2)
Total Protein: 7.5 g/dL (ref 6.5–8.1)

## 2021-02-15 LAB — FERRITIN: Ferritin: 41 ng/mL (ref 11–307)

## 2021-02-15 NOTE — Assessment & Plan Note (Signed)
09/30/2019:Patient palpated left breast mass. Mammogram showed a 2.3cm mass at the 2 o'clock position, normal-appearing axillary lymph nodes. Biopsy showed IDC, grade 3, HER-2 + (3+), ER/PR -, Ki67 40%. T2 N0 stage IIa clinical stage  Treatment plan: 1.Neoadjuvant chemotherapy with Taxol-Herceptin weekly X5(neuropathy caused discontinuation of Taxol) followed by Herceptin maintenance for 1 year completed 07/29/2020 2.Breast conserving surgery with sentinel lymph node biopsy(Dr.Newman) 01/07/20: Path CR, 0/3 LN 3.Adjuvant radiationstarted 02/18/2020-03/20/20 ----------------------------------------------------------------------------------------------------------------------------------------------------- Plan: Surveillance.  Microcytic anemia: Secondary to iron deficiency anemia: Currently on oral iron therapy and tolerating it fairly well.  She noticed that her energy levels are improving. I encouraged her to take vitamin D and a multivitamin.  Breast cancer surveillance:  1.  Breast exam 02/15/2021: Benign 2. Mammogram 09/23/2020: Benign breast density category B  Return to clinic in 1 year for follow-up.

## 2021-02-19 ENCOUNTER — Telehealth: Payer: Self-pay | Admitting: Hematology and Oncology

## 2021-02-19 NOTE — Telephone Encounter (Signed)
Sch per 5/09 los, mailed calendar

## 2021-03-04 ENCOUNTER — Encounter: Payer: Self-pay | Admitting: Internal Medicine

## 2021-03-04 ENCOUNTER — Ambulatory Visit: Payer: BC Managed Care – PPO | Admitting: Emergency Medicine

## 2021-03-04 ENCOUNTER — Ambulatory Visit: Payer: BC Managed Care – PPO | Admitting: Internal Medicine

## 2021-03-04 ENCOUNTER — Other Ambulatory Visit: Payer: Self-pay

## 2021-03-04 VITALS — BP 126/80 | HR 59 | Temp 97.9°F | Resp 18 | Ht 64.0 in | Wt 250.6 lb

## 2021-03-04 DIAGNOSIS — E039 Hypothyroidism, unspecified: Secondary | ICD-10-CM | POA: Diagnosis not present

## 2021-03-04 DIAGNOSIS — E1169 Type 2 diabetes mellitus with other specified complication: Secondary | ICD-10-CM

## 2021-03-04 DIAGNOSIS — E119 Type 2 diabetes mellitus without complications: Secondary | ICD-10-CM

## 2021-03-04 DIAGNOSIS — I10 Essential (primary) hypertension: Secondary | ICD-10-CM | POA: Diagnosis not present

## 2021-03-04 DIAGNOSIS — E118 Type 2 diabetes mellitus with unspecified complications: Secondary | ICD-10-CM

## 2021-03-04 DIAGNOSIS — E785 Hyperlipidemia, unspecified: Secondary | ICD-10-CM

## 2021-03-04 DIAGNOSIS — J4520 Mild intermittent asthma, uncomplicated: Secondary | ICD-10-CM

## 2021-03-04 LAB — HEMOGLOBIN A1C: Hgb A1c MFr Bld: 7.7 % — ABNORMAL HIGH (ref 4.6–6.5)

## 2021-03-04 LAB — LIPID PANEL
Cholesterol: 177 mg/dL (ref 0–200)
HDL: 48.6 mg/dL (ref >39.00)
LDL Cholesterol: 103 mg/dL — ABNORMAL HIGH (ref 0–99)
NonHDL: 128.86
Total CHOL/HDL Ratio: 4
Triglycerides: 130 mg/dL (ref 0.0–149.0)
VLDL: 26 mg/dL (ref 0.0–40.0)

## 2021-03-04 LAB — TSH: TSH: 1.27 u[IU]/mL (ref 0.35–4.50)

## 2021-03-04 MED ORDER — ALBUTEROL SULFATE HFA 108 (90 BASE) MCG/ACT IN AERS: 1.0000 | INHALATION_SPRAY | Freq: Four times a day (QID) | RESPIRATORY_TRACT | 3 refills | Status: DC | PRN

## 2021-03-04 NOTE — Progress Notes (Signed)
   Subjective:   Patient ID: Veronica Velazquez, female    DOB: 01-Jul-1966, 55 y.o.   MRN: [id number]  HPI The patient is a new 55 YO female coming in for diabetes (diet controlled, prescribed metformin but this made her feel hot did not take more than a week), and cholesterol (taking crestor 10 mg daily and is taking this) and blood pressure (taking atenolol/chlorthalidone and lisinopril and BP at goal, denies headaches or chest pains). Daughter serves as Veterinary surgeon.  PMH, [facility], social history reviewed and updated  Review of Systems  Constitutional: Negative.   HENT: Negative.   Eyes: Negative.   Respiratory: Negative for cough, chest tightness and shortness of breath.   Cardiovascular: Negative for chest pain, palpitations and leg swelling.  Gastrointestinal: Negative for abdominal distention, abdominal pain, constipation, diarrhea, nausea and vomiting.  Musculoskeletal: Negative.   Skin: Negative.   Neurological: Negative.   Psychiatric/Behavioral: Negative.     Objective:  Physical Exam Constitutional:      Appearance: She is well-developed.  HENT:     Head: Normocephalic and atraumatic.  Cardiovascular:     Rate and Rhythm: Normal rate and regular rhythm.  Pulmonary:     Effort: Pulmonary effort is normal. No respiratory distress.     Breath sounds: Normal breath sounds. No wheezing or rales.  Abdominal:     General: Bowel sounds are normal. There is no distension.     Palpations: Abdomen is soft.     Tenderness: There is no abdominal tenderness. There is no rebound.  Musculoskeletal:     Cervical back: Normal range of motion.  Skin:    General: Skin is warm and dry.  Neurological:     Mental Status: She is alert and oriented to person, place, and time.     Coordination: Coordination normal.     Vitals:   03/04/21 0808  BP: 126/80  Pulse: (!) 59  Resp: 18  Temp: 97.9 F (36.6 C)  TempSrc: Oral  SpO2: 98%  Weight: 250 lb 9.6 oz (113.7 kg)  Height: 5\' 4"   (1.626 m)    This visit occurred during the SARS-CoV-2 public health emergency.  Safety protocols were in place, including screening questions prior to the visit, additional usage of staff PPE, and extensive cleaning of exam room while observing appropriate contact time as indicated for disinfecting solutions.   Assessment & Plan:

## 2021-03-04 NOTE — Patient Instructions (Addendum)
We will check the blood work today for the sugars and the cholesterol.  It is okay to take the iron pill every other day as it works as well as daily.  For the sugars the goal for the HgA1c is around 7.5, we do not want it more than 8. You may not need medicine for the sugars right now we will let you know.  The cough could be coming from the lisinopril if this is all the time. We could switch it if this bothers you.

## 2021-03-05 ENCOUNTER — Encounter: Payer: Self-pay | Admitting: Hematology and Oncology

## 2021-03-05 MED ORDER — FERROUS GLUCONATE 324 (38 FE) MG PO TABS: 324.0000 mg | ORAL_TABLET | ORAL | 3 refills | Status: DC

## 2021-03-05 MED ORDER — METFORMIN HCL 500 MG PO TABS: 500.0000 mg | ORAL_TABLET | Freq: Two times a day (BID) | ORAL | 3 refills | Status: DC

## 2021-03-05 MED ORDER — LISINOPRIL 20 MG PO TABS: 30.0000 mg | ORAL_TABLET | Freq: Every day | ORAL | 3 refills | Status: DC

## 2021-03-05 MED ORDER — ROSUVASTATIN CALCIUM 10 MG PO TABS: 10.0000 mg | ORAL_TABLET | Freq: Every day | ORAL | 3 refills | Status: DC

## 2021-03-05 MED ORDER — ATENOLOL-CHLORTHALIDONE 100-25 MG PO TABS: 0.5000 | ORAL_TABLET | Freq: Every day | ORAL | 3 refills | Status: DC

## 2021-03-05 NOTE — Assessment & Plan Note (Signed)
Checking lipid panel and HgA1c. Not on medication. Is on lisinopril and crestor. Foot exam done and reminded about importance of eye exam yearly.

## 2021-03-05 NOTE — Assessment & Plan Note (Signed)
Advised to work on diet and exercise to help.

## 2021-03-05 NOTE — Assessment & Plan Note (Signed)
Checking TSH and adjust synthroid 100 mcg daily as needed.  

## 2021-03-05 NOTE — Assessment & Plan Note (Signed)
Checking lipid panel and adjust crestor 10 mg daily as needed. 

## 2021-03-05 NOTE — Assessment & Plan Note (Signed)
Checking CMP and adjust as needed. Taking atenolol/chlorthalidone and lisinopeil. BP at goal.

## 2021-03-05 NOTE — Assessment & Plan Note (Signed)
Uses albuterol prn. Refill and then as needed.

## 2021-04-02 ENCOUNTER — Other Ambulatory Visit: Payer: Self-pay | Admitting: *Deleted

## 2021-04-02 DIAGNOSIS — D509 Iron deficiency anemia, unspecified: Secondary | ICD-10-CM

## 2021-04-05 ENCOUNTER — Inpatient Hospital Stay: Payer: BC Managed Care – PPO | Admitting: Hematology and Oncology

## 2021-04-05 ENCOUNTER — Inpatient Hospital Stay: Payer: BC Managed Care – PPO

## 2021-04-09 ENCOUNTER — Ambulatory Visit (INDEPENDENT_AMBULATORY_CARE_PROVIDER_SITE_OTHER): Payer: BC Managed Care – PPO

## 2021-04-09 ENCOUNTER — Ambulatory Visit (HOSPITAL_COMMUNITY)
Admission: EM | Admit: 2021-04-09 | Discharge: 2021-04-09 | Disposition: A | Discharge status: Home / Self Care | Payer: BC Managed Care – PPO | Attending: Physician Assistant | Admitting: Physician Assistant

## 2021-04-09 ENCOUNTER — Encounter (HOSPITAL_COMMUNITY): Payer: Self-pay | Admitting: Emergency Medicine

## 2021-04-09 ENCOUNTER — Other Ambulatory Visit: Payer: Self-pay

## 2021-04-09 DIAGNOSIS — R0602 Shortness of breath: Secondary | ICD-10-CM

## 2021-04-09 DIAGNOSIS — J329 Chronic sinusitis, unspecified: Secondary | ICD-10-CM | POA: Diagnosis not present

## 2021-04-09 DIAGNOSIS — R059 Cough, unspecified: Secondary | ICD-10-CM

## 2021-04-09 DIAGNOSIS — J40 Bronchitis, not specified as acute or chronic: Secondary | ICD-10-CM | POA: Diagnosis not present

## 2021-04-09 DIAGNOSIS — J4521 Mild intermittent asthma with (acute) exacerbation: Secondary | ICD-10-CM | POA: Diagnosis not present

## 2021-04-09 MED ORDER — INNOSPIRE ESSENCE NEBULIZER MISC: 0 refills | Status: DC

## 2021-04-09 MED ORDER — BENZONATATE 100 MG PO CAPS: 100.0000 mg | ORAL_CAPSULE | Freq: Three times a day (TID) | ORAL | 0 refills | Status: DC

## 2021-04-09 MED ORDER — PREDNISONE 20 MG PO TABS: 40.0000 mg | ORAL_TABLET | Freq: Every day | ORAL | 0 refills | Status: AC

## 2021-04-09 MED ORDER — ACETAMINOPHEN 325 MG PO TABS
ORAL_TABLET | ORAL | Status: AC
  Filled 2021-04-09: qty 2

## 2021-04-09 MED ORDER — ALBUTEROL SULFATE HFA 108 (90 BASE) MCG/ACT IN AERS
INHALATION_SPRAY | RESPIRATORY_TRACT | Status: AC
  Filled 2021-04-09: qty 6.7

## 2021-04-09 MED ORDER — DOXYCYCLINE HYCLATE 100 MG PO CAPS: 100.0000 mg | ORAL_CAPSULE | Freq: Two times a day (BID) | ORAL | 0 refills | Status: DC

## 2021-04-09 MED ORDER — ALBUTEROL SULFATE HFA 108 (90 BASE) MCG/ACT IN AERS
2.0000 | INHALATION_SPRAY | Freq: Once | RESPIRATORY_TRACT | Status: AC
  Administered 2021-04-09: 17:00:00 2 via RESPIRATORY_TRACT

## 2021-04-09 MED ORDER — ALBUTEROL SULFATE (2.5 MG/3ML) 0.083% IN NEBU: 2.5000 mg | INHALATION_SOLUTION | Freq: Three times a day (TID) | RESPIRATORY_TRACT | 0 refills | Status: AC

## 2021-04-09 MED ORDER — ACETAMINOPHEN 325 MG PO TABS
650.0000 mg | ORAL_TABLET | Freq: Once | ORAL | Status: AC
  Administered 2021-04-09: 16:00:00 650 mg via ORAL

## 2021-04-09 NOTE — ED Triage Notes (Signed)
Asthma flare up for 2 days. Also reports cough and slight sore throat.   Primary issue is shortness of breath.

## 2021-04-09 NOTE — Discharge Instructions (Addendum)
Use albuterol every 4-6 hours as needed.  Start doxycycline twice daily to cover for infection.  He should stay out of the sun while on this medication.  Use Tessalon up to 3 times a day as needed for cough.  Start prednisone burst (40 mg for 3 days).  Do not take NSAIDs including aspirin, ibuprofen/Advil, naproxen/Aleve with this medication due to risk of GI bleeding.  If you have persistent or worsening symptoms you need to go to the emergency room.  Please monitor your oxygen level as discussed and if this drops below 93% you need to be evaluated.  Follow-up with either our clinic or PCP within 1 week.  Please purchase a pulse oximeter and nebulizer.

## 2021-04-09 NOTE — ED Provider Notes (Addendum)
[facility]    CSN: [id number] Arrival date & time: 04/09/21  1512      History   Chief Complaint Chief Complaint  Patient presents with   Asthma   Shortness of Breath    HPI Veronica Velazquez is a 55 y.o. female.   Patient presents today accompanied by her husband who help provide the majority of history.  Reports a several day history of increased shortness of breath.  She was found to be febrile in clinic today but have not noticed fever at home.  She does report some chest tightness and cough but denies nasal congestion, nausea, vomiting, chest pain, headache, dizziness.  Denies any known sick contacts.  She is up-to-date on COVID-19 vaccination but has not had booster.  She has not tried any over-the-counter medications for symptom management.  She denies any recent travel.  She has no concern for illness and is not interested in influenza or COVID-19 testing today.  She has a history of pneumonia and states current symptoms are similar to previous episodes of this condition.  She is requesting antibiotics if appropriate.   Past Medical History:  Diagnosis Date   Anemia    Asthma    Breast mass, left 08/13/2019   Cancer ([place]) 09/2019   left breast cancer   Dyspnea    with exertion    Hyperglycemia    Hypertension    Hypothyroidism    Microcytosis    Obesity    Personal history of chemotherapy    Pre-diabetes    Prediabetes    Trapezius muscle spasm 08/09/2019    Patient Active Problem List   Diagnosis Date Noted   Type 2 diabetes with complication ([place]) 42/68/3419   Hyperlipidemia associated with type 2 diabetes mellitus ([place]) 04/07/2020   Malignant neoplasm of upper-outer quadrant of left breast in female, estrogen receptor negative ([place]) 09/30/2019   Asthma 09/12/2012   Essential hypertension, benign 09/12/2012   Hypothyroidism    Morbid obesity ([place])     Past Surgical History:  Procedure Laterality Date   ABDOMINAL HYSTERECTOMY     BREAST  BIOPSY Left 09/24/2019   BREAST LUMPECTOMY Left 01/07/2020   BREAST LUMPECTOMY WITH RADIOACTIVE SEED AND SENTINEL LYMPH NODE BIOPSY Left 01/07/2020   Procedure: LEFT BREAST LUMPECTOMY WITH RADIOACTIVE SEED AND LEFT AXILLARY SENTINEL LYMPH NODE BIOPSY;  Surgeon: Alphonsa Overall, MD;  Location: [facility];  Service: General;  Laterality: Left;   PORT-A-CATH REMOVAL N/A 08/19/2020   Procedure: REMOVAL PORT-A-CATH;  Surgeon: Alphonsa Overall, MD;  Location: [facility];  Service: General;  Laterality: N/A;   PORTACATH PLACEMENT Right 10/15/2019   Procedure: INSERTION PORT-A-CATH WITH ULTRASOUND;  Surgeon: Alphonsa Overall, MD;  Location: [facility];  Service: General;  Laterality: Right;    OB History   No obstetric history on file.      Home Medications    Prior to Admission medications   Medication Sig Start Date End Date Taking? Authorizing Provider  benzonatate (TESSALON) 100 MG capsule Take 1 capsule (100 mg total) by mouth every 8 (eight) hours. 04/09/21  Yes Everlie Eble K, PA-C  doxycycline (VIBRAMYCIN) 100 MG capsule Take 1 capsule (100 mg total) by mouth 2 (two) times daily. 04/09/21  Yes Jeweldean Drohan K, PA-C  predniSONE (DELTASONE) 20 MG tablet Take 2 tablets (40 mg total) by mouth daily for 3 days. 04/09/21 04/12/21 Yes Doniel Maiello K, PA-C  albuterol (PROAIR HFA) 108 (90 Base) MCG/ACT inhaler Inhale 1-2 puffs into  the lungs every 6 (six) hours as needed for wheezing or shortness of breath. 03/04/21   Hoyt Koch, MD  atenolol-chlorthalidone (TENORETIC) 100-25 MG tablet Take 0.5 tablets by mouth daily. 03/05/21 06/03/21  Hoyt Koch, MD  ferrous gluconate (FERGON) 324 MG tablet Take 1 tablet (324 mg total) by mouth every other day. 03/05/21   Hoyt Koch, MD  levothyroxine (SYNTHROID) 100 MCG tablet Take 1 tablet (100 mcg total) by mouth daily. Patient taking differently: Take 100 mcg by mouth daily before breakfast. 04/07/20 08/05/20  Horald Pollen, MD  lisinopril (ZESTRIL) 20 MG tablet Take 1.5 tablets (30 mg total) by mouth daily. 03/05/21   Hoyt Koch, MD  rosuvastatin (CRESTOR) 10 MG tablet Take 1 tablet (10 mg total) by mouth daily. 03/05/21   Hoyt Koch, MD  prochlorperazine (COMPAZINE) 10 MG tablet Take 1 tablet (10 mg total) by mouth every 6 (six) hours as needed (Nausea or vomiting). 10/02/19 12/11/19  Nicholas Lose, MD    Family History Family History  Problem Relation Age of Onset   Diabetes Brother        drug-induced    Social History Social History   Tobacco Use   Smoking status: Never   Smokeless tobacco: Never  Vaping Use   Vaping Use: Never used  Substance Use Topics   Alcohol use: No   Drug use: No     Allergies   Patient has no known allergies.   Review of Systems Review of Systems  Constitutional:  Positive for activity change, fatigue and fever. Negative for appetite change.  HENT:  Negative for congestion, sinus pressure, sneezing and sore throat.   Respiratory:  Positive for cough, shortness of breath and wheezing.   Cardiovascular:  Negative for chest pain.  Gastrointestinal:  Positive for nausea. Negative for abdominal pain, diarrhea and vomiting.  Musculoskeletal:  Negative for arthralgias and myalgias.  Neurological:  Negative for dizziness, light-headedness and headaches.    Physical Exam Triage Vital Signs ED Triage Vitals  Enc Vitals Group     BP 04/09/21 1552 (!) 139/93     Pulse Rate 04/09/21 1551 79     Resp 04/09/21 1551 (!) 22     Temp 04/09/21 1551 98.8 F (37.1 C)     Temp Source 04/09/21 1551 Oral     SpO2 04/09/21 1551 95 %     Weight --      Height --      Head Circumference --      Peak Flow --      Pain Score --      Pain Loc --      Pain Edu? --      Excl. in [place]? --    No data found.  Updated Vital Signs BP 135/85 (BP Location: Right Arm)   Pulse 79   Temp 98.5 F (36.9 C) (Oral)   Resp (!) 22   SpO2 95%   Visual  Acuity Right Eye Distance:   Left Eye Distance:   Bilateral Distance:    Right Eye Near:   Left Eye Near:    Bilateral Near:     Physical Exam Vitals reviewed.  Constitutional:      General: She is awake. She is not in acute distress.    Appearance: Normal appearance. She is normal weight. She is not ill-appearing.     Comments: Very pleasant female appears stated age in no acute distress  HENT:  Head: Normocephalic and atraumatic.     Right Ear: Tympanic membrane, ear canal and external ear normal. Tympanic membrane is not erythematous or bulging.     Left Ear: Tympanic membrane, ear canal and external ear normal. Tympanic membrane is not erythematous or bulging.     Nose:     Right Sinus: No maxillary sinus tenderness or frontal sinus tenderness.     Left Sinus: No maxillary sinus tenderness or frontal sinus tenderness.     Mouth/Throat:     Pharynx: Uvula midline. No oropharyngeal exudate or posterior oropharyngeal erythema.  Cardiovascular:     Rate and Rhythm: Normal rate and regular rhythm.     Heart sounds: Normal heart sounds, S1 normal and S2 normal. No murmur heard. Pulmonary:     Effort: Pulmonary effort is normal.     Breath sounds: Wheezing and rhonchi present. No rales.     Comments: Widespread wheezing and rhonchi throughout lung fields. Lymphadenopathy:     Head:     Right side of head: No submental, submandibular or tonsillar adenopathy.     Left side of head: No submental, submandibular or tonsillar adenopathy.     Cervical: No cervical adenopathy.  Psychiatric:        Behavior: Behavior is cooperative.     UC Treatments / Results  Labs (all labs ordered are listed, but only abnormal results are displayed) Labs Reviewed - No data to display  EKG   Radiology DG Chest 2 View  Result Date: 04/09/2021 CLINICAL DATA:  Shortness of breath for 2 days. EXAM: CHEST - 2 VIEW COMPARISON:  10/15/2019 and prior radiographs FINDINGS: The cardiomediastinal  silhouette is unremarkable. There is no evidence of focal airspace disease, pulmonary edema, suspicious pulmonary nodule/mass, pleural effusion, or pneumothorax. No acute bony abnormalities are identified. IMPRESSION: No active cardiopulmonary disease. Electronically Signed   By: Margarette Canada M.D.   On: 04/09/2021 16:27    Procedures Procedures (including critical care time)  Medications Ordered in UC Medications  acetaminophen (TYLENOL) tablet 650 mg (650 mg Oral Given 04/09/21 1559)  albuterol (VENTOLIN HFA) 108 (90 Base) MCG/ACT inhaler 2 puff (2 puffs Inhalation Given 04/09/21 1640)    Initial Impression / Assessment and Plan / UC Course  I have reviewed the triage vital signs and the nursing notes.  Pertinent labs & imaging results that were available during my care of the patient were reviewed by me and considered in my medical decision making (see chart for details).      Discussed potential utility of flu and COVID-19 testing but patient and husband declined this.  Chest x-ray obtained showed no acute abnormalities.  Patient was given albuterol in clinic with improvement but not resolution of symptoms.  She was started on prednisone burst with instruction not to take NSAIDs with this medication due to risk of GI bleeding.  She was started on doxycycline to cover for atypical infection with instruction to stay out of the sun while on this medication due to sensitivity associate with this medication.  Encouraged her to use Mucinex and Flonase for symptom relief.  Recommended she monitor her oxygen saturation and if this drops below 93% she needs to be reevaluated.  Recommend she follow-up with either our clinic or primary care within 1 week to ensure symptom improvement.  Discussed alarm symptoms that warrant emergent evaluation.  Strict return precautions given to which patient and husband expressed understanding.  Patient returned requesting prescription for specific nebulizer as well as  nebulizer solution  be sent to pharmacy.  This was done as requested.  Final Clinical Impressions(s) / UC Diagnoses   Final diagnoses:  Mild intermittent asthma with exacerbation  Shortness of breath  Cough  Sinobronchitis     Discharge Instructions      Use albuterol every 4-6 hours as needed.  Start doxycycline twice daily to cover for infection.  He should stay out of the sun while on this medication.  Use Tessalon up to 3 times a day as needed for cough.  Start prednisone burst (40 mg for 3 days).  Do not take NSAIDs including aspirin, ibuprofen/Advil, naproxen/Aleve with this medication due to risk of GI bleeding.  If you have persistent or worsening symptoms you need to go to the emergency room.  Please monitor your oxygen level as discussed and if this drops below 93% you need to be evaluated.  Follow-up with either our clinic or PCP within 1 week.  Please purchase a pulse oximeter and nebulizer.     ED Prescriptions     Medication Sig Dispense Auth. Provider   benzonatate (TESSALON) 100 MG capsule Take 1 capsule (100 mg total) by mouth every 8 (eight) hours. 21 capsule Krish Bailly K, PA-C   doxycycline (VIBRAMYCIN) 100 MG capsule Take 1 capsule (100 mg total) by mouth 2 (two) times daily. 20 capsule Trana Ressler K, PA-C   predniSONE (DELTASONE) 20 MG tablet Take 2 tablets (40 mg total) by mouth daily for 3 days. 6 tablet Jalei Shibley, Derry Skill, PA-C      PDMP not reviewed this encounter.   Terrilee Croak, PA-C 04/09/21 1653    RaspetDerry Skill, PA-C 04/09/21 1723    Woodson Macha, Derry Skill, PA-C 04/09/21 1756

## 2021-05-20 ENCOUNTER — Telehealth: Payer: Self-pay | Admitting: Emergency Medicine

## 2021-05-20 ENCOUNTER — Other Ambulatory Visit: Payer: Self-pay | Admitting: Emergency Medicine

## 2021-05-20 DIAGNOSIS — I10 Essential (primary) hypertension: Secondary | ICD-10-CM

## 2021-05-20 NOTE — Telephone Encounter (Signed)
   Daughter calling to report patient has persistent cough, no other symptoms Seeking advice on taking allergy medications Declined appointment at this time  Please call

## 2021-05-21 NOTE — Telephone Encounter (Signed)
OK to take cough and allergy medications as needed.

## 2021-05-26 ENCOUNTER — Encounter: Payer: Self-pay | Admitting: Internal Medicine

## 2021-05-26 ENCOUNTER — Other Ambulatory Visit: Payer: Self-pay | Admitting: Emergency Medicine

## 2021-05-26 MED ORDER — CETIRIZINE HCL 10 MG PO TABS: 10.0000 mg | ORAL_TABLET | Freq: Every day | ORAL | 11 refills | Status: AC

## 2021-05-26 NOTE — Telephone Encounter (Signed)
   Daughter calling, she would like to know what brand of medication to purchase OTC. She states she doesn't know what to buy.  Declined virtual appointment  Requesting call today

## 2021-05-26 NOTE — Telephone Encounter (Signed)
Called and spoke with pt daughter, she verbalized understanding.

## 2021-05-26 NOTE — Telephone Encounter (Signed)
Prescription for Zyrtec 10 mg sent to pharmacy of record.  Thanks.

## 2021-06-02 ENCOUNTER — Other Ambulatory Visit: Payer: Self-pay

## 2021-06-04 NOTE — Telephone Encounter (Signed)
Please advise, pt was prescribed this medication in the ED. Can it be refilled by [place]?  Patient is requesting a refill of the following medications: Requested Prescriptions   Pending Prescriptions Disp Refills   benzonatate (TESSALON) 100 MG capsule 21 capsule 0    Sig: Take 1 capsule (100 mg total) by mouth every 8 (eight) hours.

## 2021-06-05 MED ORDER — BENZONATATE 100 MG PO CAPS: 100.0000 mg | ORAL_CAPSULE | Freq: Three times a day (TID) | ORAL | 0 refills | Status: DC

## 2021-06-20 ENCOUNTER — Other Ambulatory Visit: Payer: Self-pay | Admitting: Emergency Medicine

## 2021-06-20 DIAGNOSIS — R7989 Other specified abnormal findings of blood chemistry: Secondary | ICD-10-CM

## 2021-07-02 ENCOUNTER — Ambulatory Visit (HOSPITAL_COMMUNITY)
Admission: EM | Admit: 2021-07-02 | Discharge: 2021-07-02 | Disposition: A | Discharge status: Home / Self Care | Payer: BC Managed Care – PPO | Attending: Physician Assistant | Admitting: Physician Assistant

## 2021-07-02 ENCOUNTER — Encounter (HOSPITAL_COMMUNITY): Payer: Self-pay | Admitting: Emergency Medicine

## 2021-07-02 ENCOUNTER — Other Ambulatory Visit: Payer: Self-pay

## 2021-07-02 DIAGNOSIS — R062 Wheezing: Secondary | ICD-10-CM

## 2021-07-02 DIAGNOSIS — J40 Bronchitis, not specified as acute or chronic: Secondary | ICD-10-CM

## 2021-07-02 DIAGNOSIS — R059 Cough, unspecified: Secondary | ICD-10-CM

## 2021-07-02 DIAGNOSIS — J4531 Mild persistent asthma with (acute) exacerbation: Secondary | ICD-10-CM | POA: Diagnosis not present

## 2021-07-02 DIAGNOSIS — J329 Chronic sinusitis, unspecified: Secondary | ICD-10-CM | POA: Diagnosis not present

## 2021-07-02 MED ORDER — METHYLPREDNISOLONE SODIUM SUCC 125 MG IJ SOLR
60.0000 mg | Freq: Once | INTRAMUSCULAR | Status: AC
  Administered 2021-07-02: 60 mg via INTRAMUSCULAR

## 2021-07-02 MED ORDER — METHYLPREDNISOLONE SODIUM SUCC 125 MG IJ SOLR
INTRAMUSCULAR | Status: AC
  Filled 2021-07-02: qty 2

## 2021-07-02 MED ORDER — ALBUTEROL SULFATE HFA 108 (90 BASE) MCG/ACT IN AERS
INHALATION_SPRAY | RESPIRATORY_TRACT | Status: AC
  Filled 2021-07-02: qty 6.7

## 2021-07-02 MED ORDER — ALBUTEROL SULFATE HFA 108 (90 BASE) MCG/ACT IN AERS
4.0000 | INHALATION_SPRAY | Freq: Once | RESPIRATORY_TRACT | Status: AC
  Administered 2021-07-02: 4 via RESPIRATORY_TRACT

## 2021-07-02 MED ORDER — AMOXICILLIN-POT CLAVULANATE 875-125 MG PO TABS: 1.0000 | ORAL_TABLET | Freq: Two times a day (BID) | ORAL | 0 refills | Status: DC

## 2021-07-02 MED ORDER — BUDESONIDE-FORMOTEROL FUMARATE 160-4.5 MCG/ACT IN AERO: 2.0000 | INHALATION_SPRAY | Freq: Two times a day (BID) | RESPIRATORY_TRACT | 0 refills | Status: DC

## 2021-07-02 MED ORDER — PROMETHAZINE-DM 6.25-15 MG/5ML PO SYRP: 5.0000 mL | ORAL_SOLUTION | Freq: Two times a day (BID) | ORAL | 0 refills | Status: DC | PRN

## 2021-07-02 NOTE — ED Provider Notes (Signed)
[facility]    CSN: [id number] Arrival date & time: 07/02/21  1401      History   Chief Complaint Chief Complaint  Patient presents with   Cough    HPI Veronica Velazquez is a 55 y.o. female.   Patient presents today accompanied by her husband who help provide translation after declining video interpreter.  Reports a 1+ week history of worsening productive cough and wheezing.  She denies any fever, chest pain, nasal congestion, sore throat.  Reports cough is productive with thick sputum.  She reports associated wheezing, chest tightness, shortness of breath.  She has been using albuterol inhaler regularly with minimal improvement of symptoms.  She has been vaccinated for COVID-19 and denies history of COVID-19.  Patient denies any history of diabetes but does have this listed on her medical history.  Her last A1c was 7.7% in May 2022 but she does not take any medications to manage this condition.  She has taken steroids in the past and done well with these medications.  She denies any recent antibiotic use; was last treated with antibiotics July 2022 with doxycycline.  She denies any history of smoking.   Past Medical History:  Diagnosis Date   Anemia    Asthma    Breast mass, left 08/13/2019   Cancer ([place]) 09/2019   left breast cancer   Dyspnea    with exertion    Hyperglycemia    Hypertension    Hypothyroidism    Microcytosis    Obesity    Personal history of chemotherapy    Pre-diabetes    Prediabetes    Trapezius muscle spasm 08/09/2019    Patient Active Problem List   Diagnosis Date Noted   Type 2 diabetes with complication ([place]) 90/24/0973   Hyperlipidemia associated with type 2 diabetes mellitus ([place]) 04/07/2020   Malignant neoplasm of upper-outer quadrant of left breast in female, estrogen receptor negative ([place]) 09/30/2019   Asthma 09/12/2012   Essential hypertension, benign 09/12/2012   Hypothyroidism    Morbid obesity ([place])     Past  Surgical History:  Procedure Laterality Date   ABDOMINAL HYSTERECTOMY     BREAST BIOPSY Left 09/24/2019   BREAST LUMPECTOMY Left 01/07/2020   BREAST LUMPECTOMY WITH RADIOACTIVE SEED AND SENTINEL LYMPH NODE BIOPSY Left 01/07/2020   Procedure: LEFT BREAST LUMPECTOMY WITH RADIOACTIVE SEED AND LEFT AXILLARY SENTINEL LYMPH NODE BIOPSY;  Surgeon: Alphonsa Overall, MD;  Location: [facility];  Service: General;  Laterality: Left;   PORT-A-CATH REMOVAL N/A 08/19/2020   Procedure: REMOVAL PORT-A-CATH;  Surgeon: Alphonsa Overall, MD;  Location: [facility];  Service: General;  Laterality: N/A;   PORTACATH PLACEMENT Right 10/15/2019   Procedure: INSERTION PORT-A-CATH WITH ULTRASOUND;  Surgeon: Alphonsa Overall, MD;  Location: [facility];  Service: General;  Laterality: Right;    OB History   No obstetric history on file.      Home Medications    Prior to Admission medications   Medication Sig Start Date End Date Taking? Authorizing Provider  amoxicillin-clavulanate (AUGMENTIN) 875-125 MG tablet Take 1 tablet by mouth every 12 (twelve) hours. 07/02/21  Yes Shirely Toren, Derry Skill, PA-C  budesonide-formoterol (SYMBICORT) 160-4.5 MCG/ACT inhaler Inhale 2 puffs into the lungs in the morning and at bedtime. 07/02/21  Yes Mikaia Janvier K, PA-C  promethazine-dextromethorphan (PROMETHAZINE-DM) 6.25-15 MG/5ML syrup Take 5 mLs by mouth 2 (two) times daily as needed for cough. 07/02/21  Yes Davonn Flanery K, PA-C  albuterol (PROAIR HFA) 108 (  90 Base) MCG/ACT inhaler Inhale 1-2 puffs into the lungs every 6 (six) hours as needed for wheezing or shortness of breath. 03/04/21   Hoyt Koch, MD  albuterol (PROVENTIL) (2.5 MG/3ML) 0.083% nebulizer solution Take 3 mLs (2.5 mg total) by nebulization every 8 (eight) hours. 04/09/21   Marteze Vecchio, Derry Skill, PA-C  atenolol-chlorthalidone (TENORETIC) 100-25 MG tablet Take 0.5 tablets by mouth daily. 03/05/21 06/03/21  Hoyt Koch, MD  cetirizine (ZYRTEC) 10 MG  tablet Take 1 tablet (10 mg total) by mouth daily. 05/26/21   Horald Pollen, MD  ferrous gluconate (FERGON) 324 MG tablet Take 1 tablet (324 mg total) by mouth every other day. 03/05/21   Hoyt Koch, MD  levothyroxine (SYNTHROID) 100 MCG tablet TAKE 1 TABLET BY MOUTH EVERY DAY 06/20/21   Horald Pollen, MD  lisinopril (ZESTRIL) 20 MG tablet TAKE 1 TABLET BY MOUTH EVERY DAY 05/20/21   Horald Pollen, MD  Nebulizers [facility] ESSENCE NEBULIZER) MISC Dispense one nebulizer and associated supplies for [facility] Essence 04/09/21   Robb Sibal K, PA-C  rosuvastatin (CRESTOR) 10 MG tablet Take 1 tablet (10 mg total) by mouth daily. 03/05/21   Hoyt Koch, MD  prochlorperazine (COMPAZINE) 10 MG tablet Take 1 tablet (10 mg total) by mouth every 6 (six) hours as needed (Nausea or vomiting). 10/02/19 12/11/19  Nicholas Lose, MD    Family History Family History  Problem Relation Age of Onset   Diabetes Brother        drug-induced    Social History Social History   Tobacco Use   Smoking status: Never   Smokeless tobacco: Never  Vaping Use   Vaping Use: Never used  Substance Use Topics   Alcohol use: No   Drug use: No     Allergies   Patient has no known allergies.   Review of Systems Review of Systems  Constitutional:  Positive for activity change and fatigue. Negative for appetite change and fever.  HENT:  Negative for congestion, sinus pressure, sneezing and sore throat.   Respiratory:  Positive for cough, chest tightness, shortness of breath and wheezing.   Cardiovascular:  Negative for chest pain.  Gastrointestinal:  Negative for abdominal pain, diarrhea, nausea and vomiting.  Neurological:  Negative for dizziness, light-headedness and headaches.    Physical Exam Triage Vital Signs ED Triage Vitals  Enc Vitals Group     BP 07/02/21 1539 130/86     Pulse Rate 07/02/21 1537 69     Resp 07/02/21 1537 20     Temp 07/02/21 1537 98.7 F (37.1  C)     Temp src --      SpO2 07/02/21 1537 94 %     Weight --      Height --      Head Circumference --      Peak Flow --      Pain Score 07/02/21 1536 0     Pain Loc --      Pain Edu? --      Excl. in [place]? --    No data found.  Updated Vital Signs BP 130/86   Pulse 69   Temp 98.7 F (37.1 C)   Resp 20   SpO2 94%   Visual Acuity Right Eye Distance:   Left Eye Distance:   Bilateral Distance:    Right Eye Near:   Left Eye Near:    Bilateral Near:     Physical Exam Vitals reviewed.  Constitutional:  General: She is awake. She is not in acute distress.    Appearance: Normal appearance. She is well-developed. She is not ill-appearing.     Comments: Very pleasant female appears stated age in no acute distress sitting comfortably in exam room  HENT:     Head: Normocephalic and atraumatic.     Right Ear: Tympanic membrane, ear canal and external ear normal. Tympanic membrane is not erythematous or bulging.     Left Ear: Tympanic membrane, ear canal and external ear normal. Tympanic membrane is not erythematous or bulging.     Nose:     Right Sinus: No maxillary sinus tenderness or frontal sinus tenderness.     Left Sinus: No maxillary sinus tenderness or frontal sinus tenderness.     Mouth/Throat:     Dentition: Has dentures.     Pharynx: Uvula midline. No oropharyngeal exudate or posterior oropharyngeal erythema.  Cardiovascular:     Rate and Rhythm: Normal rate and regular rhythm.     Heart sounds: Normal heart sounds, S1 normal and S2 normal. No murmur heard. Pulmonary:     Effort: Pulmonary effort is normal.     Breath sounds: Wheezing and rhonchi present. No rales.     Comments: Widespread rhonchi and wheezing. Lymphadenopathy:     Head:     Right side of head: No submental, submandibular or tonsillar adenopathy.     Left side of head: No submental, submandibular or tonsillar adenopathy.     Cervical: No cervical adenopathy.  Psychiatric:        Behavior:  Behavior is cooperative.     UC Treatments / Results  Labs (all labs ordered are listed, but only abnormal results are displayed) Labs Reviewed - No data to display  EKG   Radiology No results found.  Procedures Procedures (including critical care time)  Medications Ordered in UC Medications  methylPREDNISolone sodium succinate (SOLU-MEDROL) 125 mg/2 mL injection 60 mg (60 mg Intramuscular Given 07/02/21 1639)  albuterol (VENTOLIN HFA) 108 (90 Base) MCG/ACT inhaler 4 puff (4 puffs Inhalation Given 07/02/21 1641)    Initial Impression / Assessment and Plan / UC Course  I have reviewed the triage vital signs and the nursing notes.  Pertinent labs & imaging results that were available during my care of the patient were reviewed by me and considered in my medical decision making (see chart for details).      Vital signs and physical exam reassuring today; no indication for emergent evaluation or imaging.  Patient had significant improvement of symptoms with Solu-Medrol and albuterol in clinic today.  She was started on Symbicort with instruction to rinse her mouth following use of this medication to prevent thrush.  She was started on Augmentin given prolonged and worsening productive cough.  Prescribed Promethazine DM to manage cough with instruction not to drive or drink alcohol with this medication as drowsiness is a common side effect.  Recommended she use over-the-counter medications including Tylenol, Flonase, Mucinex for additional symptom relief.  Discussed that she is not having improving symptoms within 24 to 48 hours she should be reevaluated.  If at any point anything worsens she is to go to the emergency room; discussed alarm symptoms that warrant emergent evaluation.  Strict return precautions given to which she expressed understanding.  Final Clinical Impressions(s) / UC Diagnoses   Final diagnoses:  Sinobronchitis  Mild persistent asthma with exacerbation  Wheezing   Cough     Discharge Instructions      We gave  you an injection of steroids today.  We are starting you on an inhaled steroid known as Symbicort.  Please take this twice a day.  Make sure you rinse your mouth following use of this medication as you can develop thrush.  Continue using albuterol as needed for shortness of breath.  We start you on an antibiotic but this is Augmentin) that you will take twice a day for 7 days.  I have also called in an cough medication known as Promethazine DM.  This can make you sleepy do not drive or drink alcohol while taking it.  If your symptoms not significantly improved within 24 to 48 hours of starting these medications you should be reevaluated.  If at any point you have worsening symptoms including severe shortness of breath, chest pain, worsening cough, fever, nausea, vomiting you need to go to the emergency room.     ED Prescriptions     Medication Sig Dispense Auth. Provider   amoxicillin-clavulanate (AUGMENTIN) 875-125 MG tablet Take 1 tablet by mouth every 12 (twelve) hours. 14 tablet Tiyonna Sardinha K, PA-C   budesonide-formoterol (SYMBICORT) 160-4.5 MCG/ACT inhaler Inhale 2 puffs into the lungs in the morning and at bedtime. 10.2 g Carolynn Tuley K, PA-C   promethazine-dextromethorphan (PROMETHAZINE-DM) 6.25-15 MG/5ML syrup Take 5 mLs by mouth 2 (two) times daily as needed for cough. 118 mL Salene Mohamud K, PA-C      PDMP not reviewed this encounter.   Terrilee Croak, PA-C 07/02/21 1703

## 2021-07-02 NOTE — ED Triage Notes (Signed)
Pt is present today with a cough and chest congestion. Pt sx started one week ago.

## 2021-07-02 NOTE — Discharge Instructions (Addendum)
We gave you an injection of steroids today.  We are starting you on an inhaled steroid known as Symbicort.  Please take this twice a day.  Make sure you rinse your mouth following use of this medication as you can develop thrush.  Continue using albuterol as needed for shortness of breath.  We start you on an antibiotic but this is Augmentin) that you will take twice a day for 7 days.  I have also called in an cough medication known as Promethazine DM.  This can make you sleepy do not drive or drink alcohol while taking it.  If your symptoms not significantly improved within 24 to 48 hours of starting these medications you should be reevaluated.  If at any point you have worsening symptoms including severe shortness of breath, chest pain, worsening cough, fever, nausea, vomiting you need to go to the emergency room.

## 2021-07-05 ENCOUNTER — Ambulatory Visit: Payer: BC Managed Care – PPO | Admitting: Internal Medicine

## 2021-07-07 ENCOUNTER — Other Ambulatory Visit: Payer: Self-pay | Admitting: Internal Medicine

## 2021-07-07 DIAGNOSIS — J4520 Mild intermittent asthma, uncomplicated: Secondary | ICD-10-CM

## 2021-07-13 ENCOUNTER — Ambulatory Visit: Payer: BC Managed Care – PPO | Admitting: Internal Medicine

## 2021-07-13 ENCOUNTER — Other Ambulatory Visit: Payer: Self-pay

## 2021-07-13 ENCOUNTER — Encounter: Payer: Self-pay | Admitting: Internal Medicine

## 2021-07-13 VITALS — BP 132/74 | HR 57 | Temp 97.9°F | Resp 18 | Ht 64.0 in | Wt 239.0 lb

## 2021-07-13 DIAGNOSIS — I10 Essential (primary) hypertension: Secondary | ICD-10-CM

## 2021-07-13 DIAGNOSIS — E118 Type 2 diabetes mellitus with unspecified complications: Secondary | ICD-10-CM

## 2021-07-13 LAB — POCT GLYCOSYLATED HEMOGLOBIN (HGB A1C): Hemoglobin A1C: 7.5 % — AB (ref 4.0–5.6)

## 2021-07-13 NOTE — Patient Instructions (Addendum)
Your HgA1c is 7.5 today which is right where it should be.   The cough could be coming from the lisinopril so if this is bothering you let us know we could switch this.

## 2021-07-13 NOTE — Progress Notes (Signed)
   Subjective:   Patient ID: Veronica Velazquez, female    DOB: Mar 16, 1966, 55 y.o.   MRN: [id number]  HPI The patient is a 55 YO female coming in with daughter for follow up. She helps to provide history.   Review of Systems  Constitutional: Negative.   HENT: Negative.    Eyes: Negative.   Respiratory:  Negative for cough, chest tightness and shortness of breath.   Cardiovascular:  Negative for chest pain, palpitations and leg swelling.  Gastrointestinal:  Negative for abdominal distention, abdominal pain, constipation, diarrhea, nausea and vomiting.  Musculoskeletal: Negative.   Skin: Negative.   Neurological: Negative.   Psychiatric/Behavioral: Negative.     Objective:  Physical Exam Constitutional:      Appearance: She is well-developed.  HENT:     Head: Normocephalic and atraumatic.  Cardiovascular:     Rate and Rhythm: Normal rate and regular rhythm.  Pulmonary:     Effort: Pulmonary effort is normal. No respiratory distress.     Breath sounds: Normal breath sounds. No wheezing or rales.  Abdominal:     General: Bowel sounds are normal. There is no distension.     Palpations: Abdomen is soft.     Tenderness: There is no abdominal tenderness. There is no rebound.  Musculoskeletal:     Cervical back: Normal range of motion.  Skin:    General: Skin is warm and dry.  Neurological:     Mental Status: She is alert and oriented to person, place, and time.     Coordination: Coordination normal.    Vitals:   07/13/21 0921  BP: 132/74  Pulse: (!) 57  Resp: 18  Temp: 97.9 F (36.6 C)  TempSrc: Oral  SpO2: 95%  Weight: 239 lb (108.4 kg)  Height: 5\' 4"  (1.626 m)    This visit occurred during the SARS-CoV-2 public health emergency.  Safety protocols were in place, including screening questions prior to the visit, additional usage of staff PPE, and extensive cleaning of exam room while observing appropriate contact time as indicated for disinfecting solutions.    Assessment & Plan:

## 2021-07-16 NOTE — Assessment & Plan Note (Signed)
Having some cough could be related to lisinopril but not bothersome to her and declines need for change today.

## 2021-07-16 NOTE — Assessment & Plan Note (Signed)
POC HgA1c done today without indication for adding medication. She is controlled with diet and encouraged to continue with changes. She is taking lisinopril and statin.

## 2021-10-05 ENCOUNTER — Ambulatory Visit
Admission: RE | Admit: 2021-10-05 | Discharge: 2021-10-05 | Disposition: A | Discharge status: Home / Self Care | Payer: BC Managed Care – PPO | Source: Ambulatory Visit | Attending: Hematology and Oncology | Admitting: Hematology and Oncology

## 2021-10-05 DIAGNOSIS — Z171 Estrogen receptor negative status [ER-]: Secondary | ICD-10-CM

## 2021-10-05 DIAGNOSIS — R922 Inconclusive mammogram: Secondary | ICD-10-CM | POA: Diagnosis not present

## 2022-02-15 ENCOUNTER — Inpatient Hospital Stay: Payer: BC Managed Care – PPO | Attending: Hematology and Oncology | Admitting: Hematology and Oncology

## 2022-02-15 ENCOUNTER — Inpatient Hospital Stay: Payer: BC Managed Care – PPO

## 2022-02-15 ENCOUNTER — Ambulatory Visit: Payer: BC Managed Care – PPO | Admitting: Internal Medicine

## 2022-02-15 ENCOUNTER — Other Ambulatory Visit: Payer: Self-pay

## 2022-02-15 ENCOUNTER — Encounter: Payer: Self-pay | Admitting: Internal Medicine

## 2022-02-15 VITALS — BP 122/66 | HR 53 | Resp 18 | Ht 64.0 in | Wt 251.2 lb

## 2022-02-15 DIAGNOSIS — E1169 Type 2 diabetes mellitus with other specified complication: Secondary | ICD-10-CM | POA: Diagnosis not present

## 2022-02-15 DIAGNOSIS — Z79899 Other long term (current) drug therapy: Secondary | ICD-10-CM | POA: Diagnosis not present

## 2022-02-15 DIAGNOSIS — Z853 Personal history of malignant neoplasm of breast: Secondary | ICD-10-CM | POA: Insufficient documentation

## 2022-02-15 DIAGNOSIS — E118 Type 2 diabetes mellitus with unspecified complications: Secondary | ICD-10-CM | POA: Diagnosis not present

## 2022-02-15 DIAGNOSIS — Z0001 Encounter for general adult medical examination with abnormal findings: Secondary | ICD-10-CM | POA: Diagnosis not present

## 2022-02-15 DIAGNOSIS — I10 Essential (primary) hypertension: Secondary | ICD-10-CM

## 2022-02-15 DIAGNOSIS — D509 Iron deficiency anemia, unspecified: Secondary | ICD-10-CM

## 2022-02-15 DIAGNOSIS — C50412 Malignant neoplasm of upper-outer quadrant of left female breast: Secondary | ICD-10-CM | POA: Diagnosis not present

## 2022-02-15 DIAGNOSIS — Z9221 Personal history of antineoplastic chemotherapy: Secondary | ICD-10-CM | POA: Diagnosis not present

## 2022-02-15 DIAGNOSIS — Z171 Estrogen receptor negative status [ER-]: Secondary | ICD-10-CM | POA: Diagnosis not present

## 2022-02-15 DIAGNOSIS — R7989 Other specified abnormal findings of blood chemistry: Secondary | ICD-10-CM

## 2022-02-15 DIAGNOSIS — E039 Hypothyroidism, unspecified: Secondary | ICD-10-CM

## 2022-02-15 DIAGNOSIS — E785 Hyperlipidemia, unspecified: Secondary | ICD-10-CM

## 2022-02-15 DIAGNOSIS — Z923 Personal history of irradiation: Secondary | ICD-10-CM | POA: Diagnosis not present

## 2022-02-15 DIAGNOSIS — Z1211 Encounter for screening for malignant neoplasm of colon: Secondary | ICD-10-CM

## 2022-02-15 DIAGNOSIS — J4520 Mild intermittent asthma, uncomplicated: Secondary | ICD-10-CM

## 2022-02-15 DIAGNOSIS — D508 Other iron deficiency anemias: Secondary | ICD-10-CM | POA: Insufficient documentation

## 2022-02-15 LAB — CBC WITH DIFFERENTIAL (CANCER CENTER ONLY)
Abs Immature Granulocytes: 0.02 10*3/uL (ref 0.00–0.07)
Basophils Absolute: 0.1 10*3/uL (ref 0.0–0.1)
Basophils Relative: 1 %
Eosinophils Absolute: 0.4 10*3/uL (ref 0.0–0.5)
Eosinophils Relative: 5 %
HCT: 41.6 % (ref 36.0–46.0)
Hemoglobin: 13.3 g/dL (ref 12.0–15.0)
Immature Granulocytes: 0 %
Lymphocytes Relative: 23 %
Lymphs Abs: 1.8 10*3/uL (ref 0.7–4.0)
MCH: 24.9 pg — ABNORMAL LOW (ref 26.0–34.0)
MCHC: 32 g/dL (ref 30.0–36.0)
MCV: 77.9 fL — ABNORMAL LOW (ref 80.0–100.0)
Monocytes Absolute: 0.7 10*3/uL (ref 0.1–1.0)
Monocytes Relative: 9 %
Neutro Abs: 4.9 10*3/uL (ref 1.7–7.7)
Neutrophils Relative %: 62 %
Platelet Count: 349 10*3/uL (ref 150–400)
RBC: 5.34 MIL/uL — ABNORMAL HIGH (ref 3.87–5.11)
RDW: 14.3 % (ref 11.5–15.5)
WBC Count: 7.9 10*3/uL (ref 4.0–10.5)
nRBC: 0 % (ref 0.0–0.2)

## 2022-02-15 LAB — CMP (CANCER CENTER ONLY)
ALT: 14 U/L (ref 0–44)
AST: 16 U/L (ref 15–41)
Albumin: 3.6 g/dL (ref 3.5–5.0)
Alkaline Phosphatase: 80 U/L (ref 38–126)
Anion gap: 9 (ref 5–15)
BUN: 15 mg/dL (ref 6–20)
CO2: 24 mmol/L (ref 22–32)
Calcium: 9.6 mg/dL (ref 8.9–10.3)
Chloride: 103 mmol/L (ref 98–111)
Creatinine: 0.73 mg/dL (ref 0.44–1.00)
GFR, Estimated: >60 mL/min (ref >60)
Glucose, Bld: 145 mg/dL — ABNORMAL HIGH (ref 70–99)
Potassium: 4 mmol/L (ref 3.5–5.1)
Sodium: 136 mmol/L (ref 135–145)
Total Bilirubin: 0.6 mg/dL (ref 0.3–1.2)
Total Protein: 7.4 g/dL (ref 6.5–8.1)

## 2022-02-15 LAB — IRON AND IRON BINDING CAPACITY (CC-WL,HP ONLY)
Iron: 78 ug/dL (ref 28–170)
Saturation Ratios: 18 % (ref 10.4–31.8)
TIBC: 424 ug/dL (ref 250–450)
UIBC: 346 ug/dL

## 2022-02-15 LAB — FERRITIN: Ferritin: 30 ng/mL (ref 11–307)

## 2022-02-15 LAB — POCT GLYCOSYLATED HEMOGLOBIN (HGB A1C): Hemoglobin A1C: 7.6 % — AB (ref 4.0–5.6)

## 2022-02-15 MED ORDER — ROSUVASTATIN CALCIUM 10 MG PO TABS: 10.0000 mg | ORAL_TABLET | Freq: Every day | ORAL | 3 refills | Status: DC

## 2022-02-15 MED ORDER — ATENOLOL-CHLORTHALIDONE 100-25 MG PO TABS: 0.5000 | ORAL_TABLET | Freq: Every day | ORAL | 3 refills | Status: DC

## 2022-02-15 MED ORDER — LISINOPRIL 20 MG PO TABS: 20.0000 mg | ORAL_TABLET | Freq: Every day | ORAL | 3 refills | Status: DC

## 2022-02-15 MED ORDER — BUDESONIDE-FORMOTEROL FUMARATE 160-4.5 MCG/ACT IN AERO: 2.0000 | INHALATION_SPRAY | Freq: Two times a day (BID) | RESPIRATORY_TRACT | 0 refills | Status: DC

## 2022-02-15 MED ORDER — LEVOTHYROXINE SODIUM 100 MCG PO TABS: 100.0000 ug | ORAL_TABLET | Freq: Every day | ORAL | 3 refills | Status: AC

## 2022-02-15 MED ORDER — FERROUS GLUCONATE 324 (38 FE) MG PO TABS: 324.0000 mg | ORAL_TABLET | ORAL | 3 refills | Status: DC

## 2022-02-15 NOTE — Assessment & Plan Note (Signed)
Weight stable. Counseled about diet and exercise.  

## 2022-02-15 NOTE — Assessment & Plan Note (Signed)
Will check labs at next visit. Continue crestor 10 mg daily and was previously very close to goal <100 LDL ?

## 2022-02-15 NOTE — Progress Notes (Signed)
? ?  Subjective:  ? ?Patient ID: Veronica Velazquez, female    DOB: [birth year], 56 y.o.   MRN: [id number] ? ?HPI ?The patient is here for physical. Daughter provides interpretation.  ? ?PMH, [facility], social history reviewed and updated ? ?Review of Systems  ?Constitutional: Negative.   ?HENT: Negative.    ?Eyes: Negative.   ?Respiratory:  Negative for cough, chest tightness and shortness of breath.   ?Cardiovascular:  Negative for chest pain, palpitations and leg swelling.  ?Gastrointestinal:  Negative for abdominal distention, abdominal pain, constipation, diarrhea, nausea and vomiting.  ?Musculoskeletal: Negative.   ?Skin: Negative.   ?Neurological: Negative.   ?Psychiatric/Behavioral: Negative.    ? ?Objective:  ?Physical Exam ?Constitutional:   ?   Appearance: She is well-developed. She is obese.  ?HENT:  ?   Head: Normocephalic and atraumatic.  ?Cardiovascular:  ?   Rate and Rhythm: Normal rate and regular rhythm.  ?Pulmonary:  ?   Effort: Pulmonary effort is normal. No respiratory distress.  ?   Breath sounds: Normal breath sounds. No wheezing or rales.  ?Abdominal:  ?   General: Bowel sounds are normal. There is no distension.  ?   Palpations: Abdomen is soft.  ?   Tenderness: There is no abdominal tenderness. There is no rebound.  ?Musculoskeletal:  ?   Cervical back: Normal range of motion.  ?Skin: ?   General: Skin is warm and dry.  ?   Comments: Foot exam done  ?Neurological:  ?   Mental Status: She is alert and oriented to person, place, and time.  ?   Coordination: Coordination normal.  ? ? ?Vitals:  ? 02/15/22 1037  ?BP: 122/66  ?Pulse: (!) 53  ?Resp: 18  ?SpO2: 98%  ?Weight: 251 lb 3.2 oz (113.9 kg)  ?Height: '5\' 4"'$  (1.626 m)  ? ? ?This visit occurred during the SARS-CoV-2 public health emergency.  Safety protocols were in place, including screening questions prior to the visit, additional usage of staff PPE, and extensive cleaning of exam room while observing appropriate contact time as indicated for  disinfecting solutions.  ? ?Assessment & Plan:  ? ? ?

## 2022-02-15 NOTE — Assessment & Plan Note (Addendum)
09/30/2019:?Patient palpated left breast mass. Mammogram showed a 2.3cm mass at the 2 o'clock position, normal-appearing axillary lymph nodes. Biopsy showed IDC, grade 3, HER-2 + (3+), ER/PR -, Ki67 40%. ?T2 N0 stage IIa clinical stage ?? ?Treatment plan: ?1.??Neoadjuvant chemotherapy with Taxol-Herceptin weekly X?5?(neuropathy caused discontinuation of Taxol) followed by Herceptin maintenance for 1 year completed 07/29/2020 ?2.??Breast conserving surgery with sentinel lymph node biopsy?(Dr.Newman) 01/07/20: Path CR, 0/3 LN ?3.??Adjuvant radiation?started 02/18/2020-03/20/20 ?----------------------------------------------------------------------------------------------------------------------------------------------------- ?Plan: Surveillance. ??? ?Microcytic anemia: Secondary to iron deficiency anemia: Currently on oral iron therapy and tolerating it fairly well.  She noticed that her energy levels are improving. ?I encouraged her to take vitamin D and a multivitamin. ?We will recheck iron levels today. ?? ?Breast cancer surveillance:  ?1.  Breast exam 02/15/2022: Benign ?2. Mammogram 10/05/2021  benign breast density category B ?? ?Return to clinic in 1 year for follow-up. ?

## 2022-02-15 NOTE — Assessment & Plan Note (Signed)
Foot exam done and POC HgA1c 7.6 which is stable from prior. Diet controlled and taking ACE-I and statin. Reminded about low carbohydrates and eye exam. ?

## 2022-02-15 NOTE — Assessment & Plan Note (Signed)
Will check labs at next visit. Continue synthroid 100 mcg daily for now. No symptoms of clinical high or low thyroid. ?

## 2022-02-15 NOTE — Progress Notes (Addendum)
? ?Patient Care Team: ?Horald Pollen, MD as PCP - General (Internal Medicine) ?Mauro Kaufmann, RN as Oncology Nurse Navigator ?Rockwell Germany, RN as Oncology Nurse Navigator ?Nicholas Lose, MD as Consulting Physician (Hematology and Oncology) ?Eppie Gibson, MD as Attending Physician (Radiation Oncology) ?Alphonsa Overall, MD as Consulting Physician (General Surgery) ? ?DIAGNOSIS:  ?Encounter Diagnoses  ?Name Primary?  ? Malignant neoplasm of upper-outer quadrant of left breast in female, estrogen receptor negative ([place])   ? Essential hypertension, benign   ? ? ?SUMMARY OF ONCOLOGIC HISTORY: ?Oncology History  ?Malignant neoplasm of upper-outer quadrant of left breast in female, estrogen receptor negative ([place])  ?09/30/2019 Initial Diagnosis  ? Patient palpated left breast mass. Mammogram showed a 2.3cm mass at the 2 o'clock position, normal-appearing axillary lymph nodes. Biopsy showed IDC, grade 3, HER-2 + (3+), ER/PR -, Ki67 40%. ?  ?10/02/2019 Cancer Staging  ? Staging form: Breast, AJCC 8th Edition ?- Clinical stage from 10/02/2019: Stage IIA (cT2, cN0, cM0, G3, ER-, PR-, HER2+) - Signed by Nicholas Lose, MD on 10/02/2019 ? ?  ?10/16/2019 - 12/10/2019 Chemotherapy  ? Taxol Herceptin neoadjuvant chemotherapy x5 (stopped early due to neuropathy toxicity) ?  ? Genetic Testing  ? Negative genetic testing. No pathogenic variants identified on the [facility] Common Hereditary Cancers Panel. The report date is 11/26/2019. ? ?The Common Hereditary Cancers Panel offered by [facility] includes sequencing and/or deletion duplication testing of the following 48 genes: APC, ATM, AXIN2, BARD1, BMPR1A, BRCA1, BRCA2, BRIP1, CDH1, CDKN2A (p14ARF), CDKN2A (p16INK4a), CKD4, CHEK2, CTNNA1, DICER1, EPCAM (Deletion/duplication testing only), GREM1 (promoter region deletion/duplication testing only), KIT, MEN1, MLH1, MSH2, MSH3, MSH6, MUTYH, NBN, NF1, NHTL1, PALB2, PDGFRA, PMS2, POLD1, POLE, PTEN, RAD50, RAD51C, RAD51D, RNF43, SDHB,  SDHC, SDHD, SMAD4, SMARCA4. STK11, TP53, TSC1, TSC2, and VHL.  The following genes were evaluated for sequence changes only: SDHA and HOXB13 c.251G>A variant only. ?  ?12/11/2019 -  Chemotherapy  ? Herceptin maintenance ?  ?01/07/2020 Surgery  ? Left lumpectomy Lucia Gaskins): no residual carcinoma, clear margins, 3 left axillary lymph nodes negative. ?  ?02/18/2020 - 03/16/2020 Radiation Therapy  ? Adjuvant radiation ?  ? ? ?CHIEF COMPLIANT: Follow-up anemia and breast cancer ? ?INTERVAL HISTORY: Veronica Velazquez is a 56 y.o. with above-mentioned history of left breast cancer who completed neoadjuvant chemotherapy, underwent a left lumpectomy, radiation, and completed adjuvant Herceptin maintenance. She also has a history of iron deficiency anemia. She presents to the clinic for a follow-up. ?She states she has some itching in her breast. Overall she feels fine.  She does not have any problems tolerating iron pills ? ?ALLERGIES:  has No Known Allergies. ? ?MEDICATIONS:  ?Current Outpatient Medications  ?Medication Sig Dispense Refill  ? albuterol (PROVENTIL) (2.5 MG/3ML) 0.083% nebulizer solution Take 3 mLs (2.5 mg total) by nebulization every 8 (eight) hours. 75 mL 0  ? albuterol (VENTOLIN HFA) 108 (90 Base) MCG/ACT inhaler INHALE 1-2 PUFFS BY MOUTH EVERY 6 HOURS AS NEEDED FOR WHEEZE OR SHORTNESS OF BREATH 8.5 each 3  ? atenolol-chlorthalidone (TENORETIC) 100-25 MG tablet Take 0.5 tablets by mouth daily. 45 tablet 3  ? budesonide-formoterol (SYMBICORT) 160-4.5 MCG/ACT inhaler Inhale 2 puffs into the lungs in the morning and at bedtime. 10.2 g 0  ? cetirizine (ZYRTEC) 10 MG tablet Take 1 tablet (10 mg total) by mouth daily. 30 tablet 11  ? ferrous gluconate (FERGON) 324 MG tablet Take 1 tablet (324 mg total) by mouth every other day. 45 tablet 3  ? levothyroxine (SYNTHROID)  100 MCG tablet TAKE 1 TABLET BY MOUTH EVERY DAY 90 tablet 3  ? lisinopril (ZESTRIL) 20 MG tablet TAKE 1 TABLET BY MOUTH EVERY DAY 90 tablet 3  ?  Nebulizers (INNOSPIRE ESSENCE NEBULIZER) MISC Dispense one nebulizer and associated supplies for Innospire Essence 1 each 0  ? promethazine-dextromethorphan (PROMETHAZINE-DM) 6.25-15 MG/5ML syrup Take 5 mLs by mouth 2 (two) times daily as needed for cough. 118 mL 0  ? rosuvastatin (CRESTOR) 10 MG tablet Take 1 tablet (10 mg total) by mouth daily. 90 tablet 3  ? ?No current facility-administered medications for this visit.  ? ? ?PHYSICAL EXAMINATION: ?ECOG PERFORMANCE STATUS: 1 - Symptomatic but completely ambulatory ? ?Vitals:  ? 02/15/22 0916  ?BP: (!) 172/62  ?Pulse: 60  ?Resp: 18  ?Temp: (!) 97.5 ?F (36.4 ?C)  ?SpO2: 97%  ? ?Filed Weights  ? 02/15/22 0916  ?Weight: 251 lb 6.4 oz (114 kg)  ? ? ?BREAST: No palpable masses or nodules in either right or left breasts. No palpable axillary supraclavicular or infraclavicular adenopathy no breast tenderness or nipple discharge. (exam performed in the presence of a chaperone) ? ?LABORATORY DATA:  ?I have reviewed the data as listed ? ?  Latest Ref Rng & Units 02/15/2021  ? 11:39 AM 10/08/2020  ?  4:18 PM 08/10/2020  ?  2:40 PM  ?CMP  ?Glucose 70 - 99 mg/dL 145   127   121    ?BUN 6 - 20 mg/dL _0 ?Creatinine 0.44 - 1.00 mg/dL 0.73   0.67   0.80    ?Sodium 135 - 145 mmol/L 139   136   140    ?Potassium 3.5 - 5.1 mmol/L 3.8   4.1   4.3    ?Chloride 98 - 111 mmol/L 102   100   103    ?CO2 22 - 32 mmol/L _1 ?Calcium 8.9 - 10.3 mg/dL 9.5   9.7   9.6    ?Total Protein 6.5 - 8.1 g/dL 7.5   6.6     ?Total Bilirubin 0.3 - 1.2 mg/dL 0.3   <0.2     ?Alkaline Phos 38 - 126 U/L 107   109     ?AST 15 - 41 U/L 12   13     ?ALT 0 - 44 U/L 11   12     ? ? ?Lab Results  ?Component Value Date  ? WBC 7.9 02/15/2022  ? HGB 13.3 02/15/2022  ? HCT 41.6 02/15/2022  ? MCV 77.9 (L) 02/15/2022  ? PLT 349 02/15/2022  ? NEUTROABS 4.9 02/15/2022  ? ? ?ASSESSMENT & PLAN:  ?Malignant neoplasm of upper-outer quadrant of left breast in female, estrogen receptor negative  ([place]) ?09/30/2019: Patient palpated left breast mass. Mammogram showed a 2.3cm mass at the 2 o'clock position, normal-appearing axillary lymph nodes. Biopsy showed IDC, grade 3, HER-2 + (3+), ER/PR -, Ki67 40%. ?T2 N0 stage IIa clinical stage ?  ?Treatment plan: ?1.  Neoadjuvant chemotherapy with Taxol-Herceptin weekly X 5 (neuropathy caused discontinuation of Taxol) followed by Herceptin maintenance for 1 year completed 07/29/2020 ?2.  Breast conserving surgery with sentinel lymph node biopsy (Dr.Newman) 01/07/20: Path CR, 0/3 LN ?3.  Adjuvant radiation started 02/18/2020-03/20/20 ?----------------------------------------------------------------------------------------------------------------------------------------------------- ?Plan: Surveillance. ?   ?Microcytic anemia: Secondary to iron deficiency anemia: Currently on oral iron therapy and tolerating it fairly well.  She noticed that her energy levels are improving. ?  ?  We will recheck iron levels today.  I will call her with the result of this test. ?  ?Breast cancer surveillance:  ?1.  Breast exam 02/15/2022: Benign ?2. Mammogram 10/05/2021  benign breast density category B ?3.  I recommended [employer] blood test for surveillance of breast cancer. ? ?Return to clinic in 1 year for follow-up. ? ? ?No orders of the defined types were placed in this encounter. ? ?The patient has a good understanding of the overall plan. she agrees with it. she will call with any problems that may develop before the next visit here. ?Total time spent: 30 mins including face to face time and time spent for planning, charting and co-ordination of care ? ? Harriette Ohara, MD ?02/15/22 ? ? ? I Gardiner Coins am scribing for Dr. Lindi Adie ? ?I have reviewed the above documentation for accuracy and completeness, and I agree with the above. ?  ?

## 2022-02-15 NOTE — Patient Instructions (Addendum)
Your sugar level today is 7.6 which is good.  ? ?We will get the colon cancer screening sent in the mail ?

## 2022-02-15 NOTE — Assessment & Plan Note (Signed)
Needs refill of symbicort which she uses as needed. Also has albuterol to use as needed.  ?

## 2022-02-15 NOTE — Assessment & Plan Note (Signed)
Flu shot yearly. Covid-19 counseled. Shingrix does not qualify did not have chicken pox from [place]. Tetanus declines today. Cologuard ordered. Mammogram up to date, pap smear unsure if up to date. Counseled about sun safety and mole surveillance. Counseled about the dangers of distracted driving. Given 10 year screening recommendations.  ? ?

## 2022-02-15 NOTE — Assessment & Plan Note (Signed)
BP at goal on lisinopril 20 mg daily and atenolol/chlorthalidone 50/12.5 mg daily. BMP checked by oncology today will review once resulted and change as appropriate.  ?

## 2022-02-16 NOTE — Progress Notes (Signed)
Successfully faxed [employer] [phone] ?

## 2022-02-17 ENCOUNTER — Ambulatory Visit: Payer: BC Managed Care – PPO | Admitting: Hematology and Oncology

## 2022-03-16 ENCOUNTER — Other Ambulatory Visit: Payer: Self-pay | Admitting: Internal Medicine

## 2022-03-18 DIAGNOSIS — Z1211 Encounter for screening for malignant neoplasm of colon: Secondary | ICD-10-CM | POA: Diagnosis not present

## 2022-03-27 LAB — COLOGUARD: COLOGUARD: NEGATIVE

## 2022-06-06 ENCOUNTER — Other Ambulatory Visit: Payer: Self-pay | Admitting: Emergency Medicine

## 2022-08-11 ENCOUNTER — Other Ambulatory Visit: Payer: Self-pay | Admitting: Internal Medicine

## 2022-08-11 DIAGNOSIS — I10 Essential (primary) hypertension: Secondary | ICD-10-CM

## 2022-08-16 ENCOUNTER — Ambulatory Visit: Payer: BC Managed Care – PPO | Admitting: Internal Medicine

## 2022-08-23 ENCOUNTER — Encounter: Payer: Self-pay | Admitting: Internal Medicine

## 2022-08-23 ENCOUNTER — Ambulatory Visit: Payer: BC Managed Care – PPO | Admitting: Internal Medicine

## 2022-08-23 VITALS — BP 138/60 | HR 72 | Temp 97.7°F | Ht 64.0 in | Wt 259.0 lb

## 2022-08-23 DIAGNOSIS — E785 Hyperlipidemia, unspecified: Secondary | ICD-10-CM | POA: Diagnosis not present

## 2022-08-23 DIAGNOSIS — E118 Type 2 diabetes mellitus with unspecified complications: Secondary | ICD-10-CM

## 2022-08-23 DIAGNOSIS — I10 Essential (primary) hypertension: Secondary | ICD-10-CM

## 2022-08-23 DIAGNOSIS — E1169 Type 2 diabetes mellitus with other specified complication: Secondary | ICD-10-CM | POA: Diagnosis not present

## 2022-08-23 DIAGNOSIS — Z23 Encounter for immunization: Secondary | ICD-10-CM | POA: Diagnosis not present

## 2022-08-23 LAB — COMPREHENSIVE METABOLIC PANEL
ALT: 13 U/L (ref 0–35)
AST: 16 U/L (ref 0–37)
Albumin: 3.9 g/dL (ref 3.5–5.2)
Alkaline Phosphatase: 92 U/L (ref 39–117)
BUN: 11 mg/dL (ref 6–23)
CO2: 30 mEq/L (ref 19–32)
Calcium: 9.3 mg/dL (ref 8.4–10.5)
Chloride: 99 mEq/L (ref 96–112)
Creatinine, Ser: 0.61 mg/dL (ref 0.40–1.20)
GFR: 98.45 mL/min (ref >60.00)
Glucose, Bld: 147 mg/dL — ABNORMAL HIGH (ref 70–99)
Potassium: 3.7 mEq/L (ref 3.5–5.1)
Sodium: 135 mEq/L (ref 135–145)
Total Bilirubin: 0.4 mg/dL (ref 0.2–1.2)
Total Protein: 7.3 g/dL (ref 6.0–8.3)

## 2022-08-23 LAB — CBC
HCT: 40.8 % (ref 36.0–46.0)
Hemoglobin: 13.2 g/dL (ref 12.0–15.0)
MCHC: 32.3 g/dL (ref 30.0–36.0)
MCV: 77.4 fl — ABNORMAL LOW (ref 78.0–100.0)
Platelets: 339 10*3/uL (ref 150.0–400.0)
RBC: 5.27 Mil/uL — ABNORMAL HIGH (ref 3.87–5.11)
RDW: 15.6 % — ABNORMAL HIGH (ref 11.5–15.5)
WBC: 9.3 10*3/uL (ref 4.0–10.5)

## 2022-08-23 LAB — LIPID PANEL
Cholesterol: 223 mg/dL — ABNORMAL HIGH (ref 0–200)
HDL: 53.4 mg/dL (ref >39.00)
LDL Cholesterol: 131 mg/dL — ABNORMAL HIGH (ref 0–99)
NonHDL: 169.92
Total CHOL/HDL Ratio: 4
Triglycerides: 196 mg/dL — ABNORMAL HIGH (ref 0.0–149.0)
VLDL: 39.2 mg/dL (ref 0.0–40.0)

## 2022-08-23 LAB — HEMOGLOBIN A1C: Hgb A1c MFr Bld: 8.5 % — ABNORMAL HIGH (ref 4.6–6.5)

## 2022-08-23 LAB — MICROALBUMIN / CREATININE URINE RATIO
Creatinine,U: 113.6 mg/dL
Microalb Creat Ratio: 0.6 mg/g (ref 0.0–30.0)
Microalb, Ur: <0.7 mg/dL (ref 0.0–1.9)

## 2022-08-23 NOTE — Assessment & Plan Note (Signed)
Weight stable, reminded to exercise and work on diet to help.

## 2022-08-23 NOTE — Assessment & Plan Note (Signed)
Checking microalbumin to creatinine ratio and HgA1c and lipid panel. Adjust her meds as needed. Diet controlled and taking ACE-I and statin.

## 2022-08-23 NOTE — Assessment & Plan Note (Addendum)
BP originally elevated but on rest did go back to normal. She is likely deconditioned and talked with daughter and patient to work on exercise. We will keep atenolol/hclorthalidone and lisinopril dosing same at 100/25 and 20 mg daily respectively. Checking CMP and adjust as needed.

## 2022-08-23 NOTE — Patient Instructions (Signed)
We have given you the flu shot and will check the labs.  Work on exercising at least 5-10 minutes per day.

## 2022-08-23 NOTE — Assessment & Plan Note (Signed)
Taking crestor 10 mg daily and checking lipid panel. Adjust as needed for LDL <70 goal.

## 2022-08-23 NOTE — Progress Notes (Signed)
   Subjective:   Patient ID: Veronica Velazquez, female    DOB: 1966-08-19, 56 y.o.   MRN: [id number]  HPI The patient is a 56 YO female coming in for follow up with daughter as interpretor. Video interpretor services attempted but no interpretor answered call.   Review of Systems  Constitutional: Negative.   HENT: Negative.    Eyes: Negative.   Respiratory:  Positive for shortness of breath. Negative for cough and chest tightness.        On exertion, stable  Cardiovascular:  Negative for chest pain, palpitations and leg swelling.  Gastrointestinal:  Negative for abdominal distention, abdominal pain, constipation, diarrhea, nausea and vomiting.  Musculoskeletal: Negative.   Skin: Negative.   Neurological: Negative.   Psychiatric/Behavioral: Negative.      Objective:  Physical Exam Constitutional:      Appearance: She is well-developed. She is obese.  HENT:     Head: Normocephalic and atraumatic.  Cardiovascular:     Rate and Rhythm: Normal rate and regular rhythm.  Pulmonary:     Effort: Pulmonary effort is normal. No respiratory distress.     Breath sounds: Normal breath sounds. No wheezing or rales.  Abdominal:     General: Bowel sounds are normal. There is no distension.     Palpations: Abdomen is soft.     Tenderness: There is no abdominal tenderness. There is no rebound.  Musculoskeletal:     Cervical back: Normal range of motion.  Skin:    General: Skin is warm and dry.  Neurological:     Mental Status: She is alert and oriented to person, place, and time.     Coordination: Coordination normal.     Vitals:   08/23/22 0901 08/23/22 0907 08/23/22 1011 08/23/22 1017  BP: (!) 160/100 (!) 160/100 138/60 138/60  Pulse: 72     Temp: 97.7 F (36.5 C)     TempSrc: Oral     SpO2: 96%     Weight: 259 lb (117.5 kg)     Height: '5\' 4"'$  (1.626 m)       Assessment & Plan:  Flu shot given at visit

## 2022-08-25 ENCOUNTER — Other Ambulatory Visit: Payer: Self-pay | Admitting: Internal Medicine

## 2022-08-25 MED ORDER — METFORMIN HCL 500 MG PO TABS: 500.0000 mg | ORAL_TABLET | Freq: Every day | ORAL | 3 refills | Status: DC

## 2022-10-18 ENCOUNTER — Encounter: Payer: Self-pay | Admitting: *Deleted

## 2022-10-18 NOTE — Progress Notes (Signed)
Receive a message from [employer] representative stating patient did not respond to their call to schedule mobile phlebotomy draw.  Representative states patient will need to reach out to [employer] directly at [phone] option #1 ext [phone] to schedule if patient wishes to proceed.   

## 2022-12-08 ENCOUNTER — Other Ambulatory Visit: Payer: Self-pay | Admitting: Internal Medicine

## 2022-12-23 ENCOUNTER — Other Ambulatory Visit: Payer: Self-pay | Admitting: Hematology and Oncology

## 2022-12-23 DIAGNOSIS — Z853 Personal history of malignant neoplasm of breast: Secondary | ICD-10-CM

## 2023-01-05 ENCOUNTER — Ambulatory Visit: Payer: BC Managed Care – PPO | Admitting: Internal Medicine

## 2023-01-05 ENCOUNTER — Ambulatory Visit
Admission: RE | Admit: 2023-01-05 | Discharge: 2023-01-05 | Disposition: A | Discharge status: Home / Self Care | Payer: BC Managed Care – PPO | Source: Ambulatory Visit | Attending: Hematology and Oncology | Admitting: Hematology and Oncology

## 2023-01-05 DIAGNOSIS — Z853 Personal history of malignant neoplasm of breast: Secondary | ICD-10-CM | POA: Diagnosis not present

## 2023-01-05 DIAGNOSIS — R928 Other abnormal and inconclusive findings on diagnostic imaging of breast: Secondary | ICD-10-CM | POA: Diagnosis not present

## 2023-01-09 ENCOUNTER — Ambulatory Visit: Payer: BC Managed Care – PPO | Admitting: Internal Medicine

## 2023-01-16 ENCOUNTER — Ambulatory Visit: Payer: BC Managed Care – PPO | Admitting: Internal Medicine

## 2023-02-03 ENCOUNTER — Encounter: Payer: Self-pay | Admitting: Internal Medicine

## 2023-02-03 ENCOUNTER — Ambulatory Visit: Payer: BC Managed Care – PPO | Admitting: Internal Medicine

## 2023-02-03 VITALS — BP 130/82 | HR 70 | Temp 98.0°F | Ht 64.0 in | Wt 259.0 lb

## 2023-02-03 DIAGNOSIS — E118 Type 2 diabetes mellitus with unspecified complications: Secondary | ICD-10-CM | POA: Diagnosis not present

## 2023-02-03 DIAGNOSIS — Z7984 Long term (current) use of oral hypoglycemic drugs: Secondary | ICD-10-CM | POA: Diagnosis not present

## 2023-02-03 LAB — POCT GLYCOSYLATED HEMOGLOBIN (HGB A1C): Hemoglobin A1C: 8.5 % — AB (ref 4.0–5.6)

## 2023-02-03 MED ORDER — ALBUTEROL SULFATE HFA 108 (90 BASE) MCG/ACT IN AERS: 2.0000 | INHALATION_SPRAY | Freq: Four times a day (QID) | RESPIRATORY_TRACT | 0 refills | Status: DC | PRN

## 2023-02-03 MED ORDER — GLIMEPIRIDE 2 MG PO TABS
2.0000 mg | ORAL_TABLET | Freq: Every day | ORAL | 1 refills | Status: DC
Start: 2023-02-03 — End: 2023-08-25

## 2023-02-03 NOTE — Patient Instructions (Addendum)
We will start amaryl (glimepiride) to take 1 pill daily with first meal of day.  Take the zyrtec every day to help the cough.

## 2023-02-03 NOTE — Assessment & Plan Note (Signed)
Not up to date on eye exam counseled on importance with patient and daughter. POC HgA1c done and 8.5 today. She did not like metfomin so we will rx amaryl 2 mg daily. Follow up 2 months. Counseled about diet and exercise and daughter admits that she does not exercise and is resistant to trying to move more. Culturally there are carbs in the diet which cannot be changed and we talked about proportions of them with vegetables to maintain cultural norms with healthier eating. She is on statin and ACE-I.

## 2023-02-03 NOTE — Progress Notes (Signed)
   Subjective:   Patient ID: Veronica Velazquez, female    DOB: January 24, 1966, 57 y.o.   MRN: [id number]  HPI The patient is a 57 YO female coming in for follow up diabetes. Daughter translates she did decline Nurse, learning disability. Not taking metformin took it 1-2 days before stopping since last visit.   Review of Systems  Constitutional: Negative.   HENT: Negative.    Eyes: Negative.   Respiratory:  Positive for cough. Negative for chest tightness and shortness of breath.   Cardiovascular:  Negative for chest pain, palpitations and leg swelling.  Gastrointestinal:  Negative for abdominal distention, abdominal pain, constipation, diarrhea, nausea and vomiting.  Musculoskeletal: Negative.   Skin: Negative.   Neurological: Negative.   Psychiatric/Behavioral: Negative.      Objective:  Physical Exam Constitutional:      Appearance: She is well-developed.  HENT:     Head: Normocephalic and atraumatic.  Cardiovascular:     Rate and Rhythm: Normal rate and regular rhythm.  Pulmonary:     Effort: Pulmonary effort is normal. No respiratory distress.     Breath sounds: Normal breath sounds. No wheezing or rales.  Abdominal:     General: Bowel sounds are normal. There is no distension.     Palpations: Abdomen is soft.     Tenderness: There is no abdominal tenderness. There is no rebound.  Musculoskeletal:     Cervical back: Normal range of motion.  Skin:    General: Skin is warm and dry.  Neurological:     Mental Status: She is alert and oriented to person, place, and time.     Coordination: Coordination normal.     Vitals:   02/03/23 0936  BP: 130/82  Pulse: 70  Temp: 98 F (36.7 C)  TempSrc: Oral  SpO2: 96%  Weight: 259 lb (117.5 kg)  Height: 5\' 4"  (1.626 m)    Assessment & Plan:  Visit time 25 minutes in face to face communication with patient and coordination of care, additional 5 minutes spent in record review, coordination or care, ordering tests, communicating/referring  to other healthcare professionals, documenting in medical records all on the same day of the visit for total time 30 minutes spent on the visit.

## 2023-02-11 NOTE — Progress Notes (Signed)
Patient Care Team: Myrlene Broker, MD as PCP - General (Internal Medicine) Pershing Proud, RN as Oncology Nurse Navigator Donnelly Angelica, RN as Oncology Nurse Navigator Serena Croissant, MD as Consulting Physician (Hematology and Oncology) Lonie Peak, MD as Attending Physician (Radiation Oncology) Ovidio Kin, MD as Consulting Physician (General Surgery)  DIAGNOSIS: No diagnosis found.  SUMMARY OF ONCOLOGIC HISTORY: Oncology History  Malignant neoplasm of upper-outer quadrant of left breast in female, estrogen receptor negative ([facility])  09/30/2019 Initial Diagnosis   Patient palpated left breast mass. Mammogram showed a 2.3cm mass at the 2 o'clock position, normal-appearing axillary lymph nodes. Biopsy showed IDC, grade 3, HER-2 + (3+), ER/PR -, Ki67 40%.   10/02/2019 Cancer Staging   Staging form: Breast, AJCC 8th Edition - Clinical stage from 10/02/2019: Stage IIA (cT2, cN0, cM0, G3, ER-, PR-, HER2+) - Signed by Serena Croissant, MD on 10/02/2019   10/16/2019 - 12/10/2019 Chemotherapy   Taxol Herceptin neoadjuvant chemotherapy x5 (stopped early due to neuropathy toxicity)    Genetic Testing   Negative genetic testing. No pathogenic variants identified on the [facility] Common Hereditary Cancers Panel. The report date is 11/26/2019.  The Common Hereditary Cancers Panel offered by [facility] includes sequencing and/or deletion duplication testing of the following 48 genes: APC, ATM, AXIN2, BARD1, BMPR1A, BRCA1, BRCA2, BRIP1, CDH1, CDKN2A (p14ARF), CDKN2A (p16INK4a), CKD4, CHEK2, CTNNA1, DICER1, EPCAM (Deletion/duplication testing only), GREM1 (promoter region deletion/duplication testing only), KIT, MEN1, MLH1, MSH2, MSH3, MSH6, MUTYH, NBN, NF1, NHTL1, PALB2, PDGFRA, PMS2, POLD1, POLE, PTEN, RAD50, RAD51C, RAD51D, RNF43, SDHB, SDHC, SDHD, SMAD4, SMARCA4. STK11, TP53, TSC1, TSC2, and VHL.  The following genes were evaluated for sequence changes only: SDHA and HOXB13 c.251G>A variant only.    12/11/2019 -  Chemotherapy   Herceptin maintenance   01/07/2020 Surgery   Left lumpectomy Ezzard Standing): no residual carcinoma, clear margins, 3 left axillary lymph nodes negative.   02/18/2020 - 03/16/2020 Radiation Therapy   Adjuvant radiation     CHIEF COMPLIANT:   INTERVAL HISTORY: Veronica Velazquez is a   ALLERGIES:  has No Known Allergies.  MEDICATIONS:  Current Outpatient Medications  Medication Sig Dispense Refill   albuterol (PROVENTIL) (2.5 MG/3ML) 0.083% nebulizer solution Take 3 mLs (2.5 mg total) by nebulization every 8 (eight) hours. (Patient not taking: Reported on 08/23/2022) 75 mL 0   albuterol (VENTOLIN HFA) 108 (90 Base) MCG/ACT inhaler Inhale 2 puffs into the lungs every 6 (six) hours as needed for wheezing or shortness of breath. 8 g 0   atenolol-chlorthalidone (TENORETIC) 100-25 MG tablet TAKE 1/2 TABLET BY MOUTH EVERY DAY 45 tablet 4   budesonide-formoterol (SYMBICORT) 160-4.5 MCG/ACT inhaler INHALE 2 PUFFS INTO THE LUNGS IN THE MORNING AND AT BEDTIME 10.2 each 11   cetirizine (ZYRTEC) 10 MG tablet Take 1 tablet (10 mg total) by mouth daily. 30 tablet 11   ferrous gluconate (FERGON) 324 MG tablet Take 1 tablet (324 mg total) by mouth every other day. 45 tablet 3   glimepiride (AMARYL) 2 MG tablet Take 1 tablet (2 mg total) by mouth daily before breakfast. 90 tablet 1   levothyroxine (SYNTHROID) 100 MCG tablet Take 1 tablet (100 mcg total) by mouth daily. 90 tablet 3   lisinopril (ZESTRIL) 20 MG tablet Take 1 tablet (20 mg total) by mouth daily. 90 tablet 3   rosuvastatin (CRESTOR) 10 MG tablet Take 1 tablet (10 mg total) by mouth daily. 90 tablet 3   No current facility-administered medications for this visit.  PHYSICAL EXAMINATION: ECOG PERFORMANCE STATUS: {CHL ONC ECOG PS:[phone]}  There were no vitals filed for this visit. There were no vitals filed for this visit.  BREAST:*** No palpable masses or nodules in either right or left breasts. No  palpable axillary supraclavicular or infraclavicular adenopathy no breast tenderness or nipple discharge. (exam performed in the presence of a chaperone)  LABORATORY DATA:  I have reviewed the data as listed    Latest Ref Rng & Units 08/23/2022    9:37 AM 02/15/2022   10:09 AM 02/15/2021   11:39 AM  CMP  Glucose 70 - 99 mg/dL 981  191  478   BUN 6 - 23 mg/dL 11  15  13    Creatinine 0.40 - 1.20 mg/dL 2.95  6.21  3.08   Sodium 135 - 145 mEq/L 135  136  139   Potassium 3.5 - 5.1 mEq/L 3.7  4.0  3.8   Chloride 96 - 112 mEq/L 99  103  102   CO2 19 - 32 mEq/L 30  24  27    Calcium 8.4 - 10.5 mg/dL 9.3  9.6  9.5   Total Protein 6.0 - 8.3 g/dL 7.3  7.4  7.5   Total Bilirubin 0.2 - 1.2 mg/dL 0.4  0.6  0.3   Alkaline Phos 39 - 117 U/L 92  80  107   AST 0 - 37 U/L 16  16  12    ALT 0 - 35 U/L 13  14  11      Lab Results  Component Value Date   WBC 9.3 08/23/2022   HGB 13.2 08/23/2022   HCT 40.8 08/23/2022   MCV 77.4 (L) 08/23/2022   PLT 339.0 08/23/2022   NEUTROABS 4.9 02/15/2022    ASSESSMENT & PLAN:  No problem-specific Assessment & Plan notes found for this encounter.    No orders of the defined types were placed in this encounter.  The patient has a good understanding of the overall plan. she agrees with it. she will call with any problems that may develop before the next visit here. Total time spent: 30 mins including face to face time and time spent for planning, charting and co-ordination of care   Sherlyn Lick, CMA 02/11/23    I Janan Ridge am acting as a Neurosurgeon for [employer]  ***

## 2023-02-16 ENCOUNTER — Other Ambulatory Visit: Payer: Self-pay

## 2023-02-16 ENCOUNTER — Inpatient Hospital Stay: Payer: BC Managed Care – PPO

## 2023-02-16 ENCOUNTER — Inpatient Hospital Stay: Payer: BC Managed Care – PPO | Attending: Hematology and Oncology | Admitting: Hematology and Oncology

## 2023-02-16 VITALS — BP 149/79 | HR 76 | Temp 97.7°F | Resp 18 | Ht 64.0 in | Wt 256.3 lb

## 2023-02-16 DIAGNOSIS — I10 Essential (primary) hypertension: Secondary | ICD-10-CM | POA: Diagnosis not present

## 2023-02-16 DIAGNOSIS — Z79899 Other long term (current) drug therapy: Secondary | ICD-10-CM | POA: Insufficient documentation

## 2023-02-16 DIAGNOSIS — Z171 Estrogen receptor negative status [ER-]: Secondary | ICD-10-CM

## 2023-02-16 DIAGNOSIS — C50412 Malignant neoplasm of upper-outer quadrant of left female breast: Secondary | ICD-10-CM | POA: Diagnosis not present

## 2023-02-16 DIAGNOSIS — Z9221 Personal history of antineoplastic chemotherapy: Secondary | ICD-10-CM | POA: Diagnosis not present

## 2023-02-16 DIAGNOSIS — D509 Iron deficiency anemia, unspecified: Secondary | ICD-10-CM | POA: Diagnosis not present

## 2023-02-16 DIAGNOSIS — Z7951 Long term (current) use of inhaled steroids: Secondary | ICD-10-CM | POA: Insufficient documentation

## 2023-02-16 DIAGNOSIS — Z923 Personal history of irradiation: Secondary | ICD-10-CM | POA: Diagnosis not present

## 2023-02-16 DIAGNOSIS — Z7984 Long term (current) use of oral hypoglycemic drugs: Secondary | ICD-10-CM | POA: Diagnosis not present

## 2023-02-16 DIAGNOSIS — Z853 Personal history of malignant neoplasm of breast: Secondary | ICD-10-CM | POA: Insufficient documentation

## 2023-02-16 DIAGNOSIS — E119 Type 2 diabetes mellitus without complications: Secondary | ICD-10-CM | POA: Insufficient documentation

## 2023-02-16 LAB — CMP (CANCER CENTER ONLY)
ALT: 12 U/L (ref 0–44)
AST: 13 U/L — ABNORMAL LOW (ref 15–41)
Albumin: 4.2 g/dL (ref 3.5–5.0)
Alkaline Phosphatase: 99 U/L (ref 38–126)
Anion gap: 8 (ref 5–15)
BUN: 15 mg/dL (ref 6–20)
CO2: 30 mmol/L (ref 22–32)
Calcium: 10 mg/dL (ref 8.9–10.3)
Chloride: 100 mmol/L (ref 98–111)
Creatinine: 0.67 mg/dL (ref 0.44–1.00)
GFR, Estimated: >60 mL/min (ref >60)
Glucose, Bld: 167 mg/dL — ABNORMAL HIGH (ref 70–99)
Potassium: 3.6 mmol/L (ref 3.5–5.1)
Sodium: 138 mmol/L (ref 135–145)
Total Bilirubin: 0.5 mg/dL (ref 0.3–1.2)
Total Protein: 7.9 g/dL (ref 6.5–8.1)

## 2023-02-16 LAB — CBC WITH DIFFERENTIAL (CANCER CENTER ONLY)
Abs Immature Granulocytes: 0.02 10*3/uL (ref 0.00–0.07)
Basophils Absolute: 0.1 10*3/uL (ref 0.0–0.1)
Basophils Relative: 1 %
Eosinophils Absolute: 0.3 10*3/uL (ref 0.0–0.5)
Eosinophils Relative: 4 %
HCT: 43.9 % (ref 36.0–46.0)
Hemoglobin: 13.8 g/dL (ref 12.0–15.0)
Immature Granulocytes: 0 %
Lymphocytes Relative: 23 %
Lymphs Abs: 2 10*3/uL (ref 0.7–4.0)
MCH: 24.4 pg — ABNORMAL LOW (ref 26.0–34.0)
MCHC: 31.4 g/dL (ref 30.0–36.0)
MCV: 77.6 fL — ABNORMAL LOW (ref 80.0–100.0)
Monocytes Absolute: 0.6 10*3/uL (ref 0.1–1.0)
Monocytes Relative: 7 %
Neutro Abs: 5.5 10*3/uL (ref 1.7–7.7)
Neutrophils Relative %: 65 %
Platelet Count: 363 10*3/uL (ref 150–400)
RBC: 5.66 MIL/uL — ABNORMAL HIGH (ref 3.87–5.11)
RDW: 15.5 % (ref 11.5–15.5)
WBC Count: 8.4 10*3/uL (ref 4.0–10.5)
nRBC: 0 % (ref 0.0–0.2)

## 2023-02-16 LAB — FERRITIN: Ferritin: 25 ng/mL (ref 11–307)

## 2023-02-16 LAB — IRON AND IRON BINDING CAPACITY (CC-WL,HP ONLY)
Iron: 68 ug/dL (ref 28–170)
Saturation Ratios: 15 % (ref 10.4–31.8)
TIBC: 465 ug/dL — ABNORMAL HIGH (ref 250–450)
UIBC: 397 ug/dL (ref 148–442)

## 2023-02-16 MED ORDER — FERROUS GLUCONATE 324 (38 FE) MG PO TABS
324.0000 mg | ORAL_TABLET | Freq: Every day | ORAL | 3 refills | Status: DC
Start: 2023-02-16 — End: 2024-05-24

## 2023-02-16 NOTE — Assessment & Plan Note (Addendum)
09/30/2019: Patient palpated left breast mass. Mammogram showed a 2.3cm mass at the 2 o'clock position, normal-appearing axillary lymph nodes. Biopsy showed IDC, grade 3, HER-2 + (3+), ER/PR -, Ki67 40%. T2 N0 stage IIa clinical stage   Treatment plan: 1.  Neoadjuvant chemotherapy with Taxol-Herceptin weekly X 5 (neuropathy caused discontinuation of Taxol) followed by Herceptin maintenance for 1 year completed 07/29/2020 2.  Breast conserving surgery with sentinel lymph node biopsy (Dr.Newman) 01/07/20: Path CR, 0/3 LN 3.  Adjuvant radiation started 02/18/2020-03/20/20 ----------------------------------------------------------------------------------------------------------------------------------------------------- Plan: Surveillance.    Microcytic anemia: Secondary to iron deficiency anemia: Currently on oral iron therapy and tolerating it fairly well.  08/23/2022: Hemoglobin 13.2, MCV 77.4 I would like to repeat CBC iron studies CMP because she is feeling fatigued.  Uncontrolled diabetes: I discussed with her that it is extremely important to get her diabetes under control.  She has not been taking the medication prescribed by her primary care physician.  I told her that she needs to follow the instructions of her PCP and get the hemoglobin A1c below 7.  We also briefly talked about GLP-1 inhibitors.   Breast cancer surveillance:  Mammogram 01/05/2023 benign breast density category B   Return to clinic in 1 year for follow-up with labs.

## 2023-02-18 ENCOUNTER — Other Ambulatory Visit: Payer: Self-pay | Admitting: Internal Medicine

## 2023-02-18 DIAGNOSIS — E1169 Type 2 diabetes mellitus with other specified complication: Secondary | ICD-10-CM

## 2023-02-18 DIAGNOSIS — I10 Essential (primary) hypertension: Secondary | ICD-10-CM

## 2023-03-06 ENCOUNTER — Other Ambulatory Visit: Payer: Self-pay | Admitting: Hematology and Oncology

## 2023-03-06 DIAGNOSIS — I10 Essential (primary) hypertension: Secondary | ICD-10-CM

## 2023-08-25 ENCOUNTER — Other Ambulatory Visit: Payer: Self-pay | Admitting: Internal Medicine

## 2023-09-10 ENCOUNTER — Other Ambulatory Visit: Payer: Self-pay | Admitting: Internal Medicine

## 2023-09-10 DIAGNOSIS — I10 Essential (primary) hypertension: Secondary | ICD-10-CM

## 2023-10-10 ENCOUNTER — Other Ambulatory Visit: Payer: Self-pay | Admitting: Hematology and Oncology

## 2023-10-10 DIAGNOSIS — Z853 Personal history of malignant neoplasm of breast: Secondary | ICD-10-CM

## 2023-10-23 ENCOUNTER — Encounter: Payer: BC Managed Care – PPO | Admitting: Emergency Medicine

## 2023-11-28 ENCOUNTER — Other Ambulatory Visit: Payer: Self-pay | Admitting: Internal Medicine

## 2023-12-28 ENCOUNTER — Other Ambulatory Visit: Payer: Self-pay | Admitting: Internal Medicine

## 2024-02-02 ENCOUNTER — Other Ambulatory Visit: Payer: Self-pay | Admitting: Hematology and Oncology

## 2024-02-02 DIAGNOSIS — Z1231 Encounter for screening mammogram for malignant neoplasm of breast: Secondary | ICD-10-CM

## 2024-02-05 ENCOUNTER — Other Ambulatory Visit: Payer: Self-pay | Admitting: Internal Medicine

## 2024-02-19 ENCOUNTER — Other Ambulatory Visit: Payer: BC Managed Care – PPO

## 2024-02-19 ENCOUNTER — Ambulatory Visit: Payer: BC Managed Care – PPO | Admitting: Hematology and Oncology

## 2024-02-23 ENCOUNTER — Ambulatory Visit
Admission: RE | Admit: 2024-02-23 | Discharge: 2024-02-23 | Disposition: A | Discharge status: Home / Self Care | Source: Ambulatory Visit | Attending: Hematology and Oncology | Admitting: Hematology and Oncology

## 2024-02-23 DIAGNOSIS — Z1231 Encounter for screening mammogram for malignant neoplasm of breast: Secondary | ICD-10-CM | POA: Diagnosis not present

## 2024-02-26 ENCOUNTER — Encounter: Admitting: Emergency Medicine

## 2024-02-29 ENCOUNTER — Other Ambulatory Visit: Payer: Self-pay | Admitting: Hematology and Oncology

## 2024-02-29 DIAGNOSIS — N6489 Other specified disorders of breast: Secondary | ICD-10-CM

## 2024-03-05 ENCOUNTER — Ambulatory Visit
Admission: RE | Admit: 2024-03-05 | Discharge: 2024-03-05 | Discharge status: Home / Self Care | Source: Ambulatory Visit | Attending: Hematology and Oncology

## 2024-03-05 ENCOUNTER — Ambulatory Visit
Admission: RE | Admit: 2024-03-05 | Discharge: 2024-03-05 | Disposition: A | Discharge status: Home / Self Care | Source: Ambulatory Visit | Attending: Hematology and Oncology | Admitting: Hematology and Oncology

## 2024-03-05 ENCOUNTER — Other Ambulatory Visit: Payer: Self-pay | Admitting: Hematology and Oncology

## 2024-03-05 DIAGNOSIS — N6320 Unspecified lump in the left breast, unspecified quadrant: Secondary | ICD-10-CM

## 2024-03-05 DIAGNOSIS — N6489 Other specified disorders of breast: Secondary | ICD-10-CM

## 2024-03-05 DIAGNOSIS — N6325 Unspecified lump in the left breast, overlapping quadrants: Secondary | ICD-10-CM | POA: Diagnosis not present

## 2024-03-05 DIAGNOSIS — Z853 Personal history of malignant neoplasm of breast: Secondary | ICD-10-CM | POA: Diagnosis not present

## 2024-03-05 DIAGNOSIS — R928 Other abnormal and inconclusive findings on diagnostic imaging of breast: Secondary | ICD-10-CM | POA: Diagnosis not present

## 2024-03-08 ENCOUNTER — Other Ambulatory Visit: Payer: Self-pay | Admitting: *Deleted

## 2024-03-08 ENCOUNTER — Ambulatory Visit
Admission: RE | Admit: 2024-03-08 | Discharge: 2024-03-08 | Disposition: A | Discharge status: Home / Self Care | Source: Ambulatory Visit | Attending: Hematology and Oncology | Admitting: Hematology and Oncology

## 2024-03-08 DIAGNOSIS — N6032 Fibrosclerosis of left breast: Secondary | ICD-10-CM | POA: Diagnosis not present

## 2024-03-08 DIAGNOSIS — N6320 Unspecified lump in the left breast, unspecified quadrant: Secondary | ICD-10-CM

## 2024-03-08 DIAGNOSIS — N6325 Unspecified lump in the left breast, overlapping quadrants: Secondary | ICD-10-CM | POA: Diagnosis not present

## 2024-03-08 DIAGNOSIS — Z171 Estrogen receptor negative status [ER-]: Secondary | ICD-10-CM

## 2024-03-08 DIAGNOSIS — R92322 Mammographic fibroglandular density, left breast: Secondary | ICD-10-CM | POA: Diagnosis not present

## 2024-03-08 DIAGNOSIS — Z853 Personal history of malignant neoplasm of breast: Secondary | ICD-10-CM | POA: Diagnosis not present

## 2024-03-08 HISTORY — PX: BREAST BIOPSY: SHX20

## 2024-03-11 ENCOUNTER — Inpatient Hospital Stay: Admitting: Hematology and Oncology

## 2024-03-11 ENCOUNTER — Telehealth: Payer: Self-pay

## 2024-03-11 ENCOUNTER — Inpatient Hospital Stay

## 2024-03-11 LAB — SURGICAL PATHOLOGY

## 2024-03-11 NOTE — Telephone Encounter (Signed)
 Placed call to pt's daughter Rexene Catching, regarding pts appt for today. Bx results are not back yet. Pt r/s 03/19/24. [place] is aware and will make pt aware.

## 2024-03-11 NOTE — Assessment & Plan Note (Deleted)
 09/30/2019: Patient palpated left breast mass. Mammogram showed a 2.3cm mass at the 2 o'clock position, normal-appearing axillary lymph nodes. Biopsy showed IDC, grade 3, HER-2 + (3+), ER/PR -, Ki67 40%. T2 N0 stage IIa clinical stage   Treatment plan: 1.  Neoadjuvant chemotherapy with Taxol -Herceptin  weekly X 5 (neuropathy caused discontinuation of Taxol ) followed by Herceptin  maintenance for 1 year completed 07/29/2020 2.  Breast conserving surgery with sentinel lymph node biopsy (Dr.Newman) 01/07/20: Path CR, 0/3 LN 3.  Adjuvant radiation started 02/18/2020-03/20/20 ----------------------------------------------------------------------------------------------------------------------------------------------------- Plan: Surveillance.    Microcytic anemia: Secondary to iron deficiency anemia: Currently on oral iron therapy and tolerating it fairly well.  08/23/2022: Hemoglobin 13.2, MCV 77.4 I would like to repeat CBC iron studies CMP because she is feeling fatigued.   Uncontrolled diabetes: I discussed with her that it is extremely important to get her diabetes under control.  She has not been taking the medication prescribed by her primary care physician.  I told her that she needs to follow the instructions of her PCP and get the hemoglobin A1c below 7.  We also briefly talked about GLP-1 inhibitors.   Breast cancer surveillance:  Mammogram 02/29/2024: Possible asymmetry left breast 03/03/2024: Ultrasound: Left breast 5 mm irregular hypoechoic mass at 12 o'clock position, no abnormal axillary lymph nodes Left breast biopsy:   Return to clinic in 1 year for follow-up with labs.

## 2024-03-15 ENCOUNTER — Other Ambulatory Visit

## 2024-03-18 ENCOUNTER — Encounter: Payer: Self-pay | Admitting: Internal Medicine

## 2024-03-18 ENCOUNTER — Ambulatory Visit (INDEPENDENT_AMBULATORY_CARE_PROVIDER_SITE_OTHER): Admitting: Internal Medicine

## 2024-03-18 VITALS — BP 138/88 | HR 79 | Temp 98.4°F | Ht 62.0 in | Wt 252.0 lb

## 2024-03-18 DIAGNOSIS — E039 Hypothyroidism, unspecified: Secondary | ICD-10-CM | POA: Diagnosis not present

## 2024-03-18 DIAGNOSIS — E118 Type 2 diabetes mellitus with unspecified complications: Secondary | ICD-10-CM

## 2024-03-18 DIAGNOSIS — Z0001 Encounter for general adult medical examination with abnormal findings: Secondary | ICD-10-CM

## 2024-03-18 DIAGNOSIS — E1169 Type 2 diabetes mellitus with other specified complication: Secondary | ICD-10-CM

## 2024-03-18 DIAGNOSIS — E785 Hyperlipidemia, unspecified: Secondary | ICD-10-CM | POA: Diagnosis not present

## 2024-03-18 DIAGNOSIS — J4520 Mild intermittent asthma, uncomplicated: Secondary | ICD-10-CM

## 2024-03-18 DIAGNOSIS — I10 Essential (primary) hypertension: Secondary | ICD-10-CM | POA: Diagnosis not present

## 2024-03-18 LAB — CBC
HCT: 38.3 % (ref 36.0–46.0)
Hemoglobin: 12.5 g/dL (ref 12.0–15.0)
MCHC: 32.6 g/dL (ref 30.0–36.0)
MCV: 74.1 fl — ABNORMAL LOW (ref 78.0–100.0)
Platelets: 319 10*3/uL (ref 150.0–400.0)
RBC: 5.17 Mil/uL — ABNORMAL HIGH (ref 3.87–5.11)
RDW: 15.4 % (ref 11.5–15.5)
WBC: 6.3 10*3/uL (ref 4.0–10.5)

## 2024-03-18 LAB — COMPREHENSIVE METABOLIC PANEL WITH GFR
ALT: 11 U/L (ref 0–35)
AST: 10 U/L (ref 0–37)
Albumin: 4 g/dL (ref 3.5–5.2)
Alkaline Phosphatase: 94 U/L (ref 39–117)
BUN: 12 mg/dL (ref 6–23)
CO2: 30 meq/L (ref 19–32)
Calcium: 9.7 mg/dL (ref 8.4–10.5)
Chloride: 98 meq/L (ref 96–112)
Creatinine, Ser: 0.54 mg/dL (ref 0.40–1.20)
GFR: 100.28 mL/min (ref >60.00)
Glucose, Bld: 205 mg/dL — ABNORMAL HIGH (ref 70–99)
Potassium: 3.8 meq/L (ref 3.5–5.1)
Sodium: 135 meq/L (ref 135–145)
Total Bilirubin: 0.3 mg/dL (ref 0.2–1.2)
Total Protein: 7.5 g/dL (ref 6.0–8.3)

## 2024-03-18 LAB — LIPID PANEL
Cholesterol: 228 mg/dL — ABNORMAL HIGH (ref 0–200)
HDL: 53 mg/dL (ref >39.00)
LDL Cholesterol: 145 mg/dL — ABNORMAL HIGH (ref 0–99)
NonHDL: 175.18
Total CHOL/HDL Ratio: 4
Triglycerides: 150 mg/dL — ABNORMAL HIGH (ref 0.0–149.0)
VLDL: 30 mg/dL (ref 0.0–40.0)

## 2024-03-18 NOTE — Progress Notes (Signed)
   Subjective:   Patient ID: Veronica Velazquez, female    DOB: 05/31/1966, 58 y.o.   MRN: [id number]  HPI The patient is a 58 YO female coming in for physical.  PMH, FMH, social history reviewed and updated  Review of Systems  Objective:  Physical Exam  Vitals:   03/18/24 1548  BP: (!) 140/100  Pulse: 79  Temp: 98.4 F (36.9 C)  TempSrc: Oral  SpO2: 98%  Weight: 252 lb (114.3 kg)  Height: 5\' 4"  (1.626 m)    Assessment & Plan:

## 2024-03-19 ENCOUNTER — Inpatient Hospital Stay: Admitting: Hematology and Oncology

## 2024-03-19 ENCOUNTER — Inpatient Hospital Stay

## 2024-03-19 LAB — HEMOGLOBIN A1C: Hgb A1c MFr Bld: 9.7 % — ABNORMAL HIGH (ref 4.6–6.5)

## 2024-03-19 LAB — MICROALBUMIN / CREATININE URINE RATIO
Creatinine,U: 132 mg/dL
Microalb Creat Ratio: 8.2 mg/g (ref 0.0–30.0)
Microalb, Ur: 1.1 mg/dL (ref 0.0–1.9)

## 2024-03-19 NOTE — Assessment & Plan Note (Signed)
 No flare today but she likely has exercise induced symptoms. Advised her and daughter she should use albuterol  prior to exercise to help increase endurance.

## 2024-03-21 LAB — TSH: TSH: 7.06 u[IU]/mL — ABNORMAL HIGH (ref 0.35–5.50)

## 2024-03-21 LAB — T4, FREE: Free T4: 1.06 ng/dL (ref 0.60–1.60)

## 2024-03-22 ENCOUNTER — Ambulatory Visit: Payer: Self-pay | Admitting: Internal Medicine

## 2024-03-22 ENCOUNTER — Encounter: Payer: Self-pay | Admitting: Internal Medicine

## 2024-03-22 NOTE — Assessment & Plan Note (Signed)
Taking crestor and checking lipid panel and adjust as needed.  

## 2024-03-22 NOTE — Assessment & Plan Note (Signed)
 We discussed her poorly controlled diabetes and need to help. She is taking amaryl  and never returned for recheck. Unclear control. Checking UACR, Hga1c, lipid panel and CMP. Counseled patient and daughter that we would want to see every 3 months until controlled and then likely every 6 months.

## 2024-03-22 NOTE — Assessment & Plan Note (Signed)
 Flu shot declines. Pneumonia declines. Shingrix declines need. Tetanus declines. Cologuard upto date. Mammogram up to date, pap smear counseled due. Counseled about sun safety and mole surveillance. Counseled about the dangers of distracted driving. Given 10 year screening recommendations.

## 2024-03-22 NOTE — Assessment & Plan Note (Signed)
 It is unlikely she is taking thyroid  medication as it is not refilled in some time. Checking TSH and free T4 and will verify with pharmacy they are not filling. If not will remove from list.

## 2024-03-22 NOTE — Assessment & Plan Note (Signed)
 BMI >40 and counseled about diet and exercise to help.

## 2024-03-22 NOTE — Assessment & Plan Note (Signed)
BP at goal on atenolol/chlorthalidone and lisinopril. Checking CMP and adjust as needed.

## 2024-04-10 NOTE — Assessment & Plan Note (Signed)
 09/30/2019: Patient palpated left breast mass. Mammogram showed a 2.3cm mass at the 2 o'clock position, normal-appearing axillary lymph nodes. Biopsy showed IDC, grade 3, HER-2 + (3+), ER/PR -, Ki67 40%. T2 N0 stage IIa clinical stage   Treatment plan: 1.  Neoadjuvant chemotherapy with Taxol -Herceptin  weekly X 5 (neuropathy caused discontinuation of Taxol ) followed by Herceptin  maintenance for 1 year completed 07/29/2020 2.  Breast conserving surgery with sentinel lymph node biopsy (Dr.Newman) 01/07/20: Path CR, 0/3 LN 3.  Adjuvant radiation started 02/18/2020-03/20/20 ----------------------------------------------------------------------------------------------------------------------------------------------------- Plan: Surveillance.    Microcytic anemia: Secondary to iron deficiency anemia: Currently on oral iron therapy and tolerating it fairly well.  08/23/2022: Hemoglobin 13.2, MCV 77.4 I would like to repeat CBC iron studies CMP because she is feeling fatigued.   Uncontrolled diabetes:  Breast cancer surveillance:  Mammogram 01/05/2023 benign breast density category B   Return to clinic in 1 year for follow-up with labs.

## 2024-04-11 ENCOUNTER — Telehealth: Payer: Self-pay | Admitting: Internal Medicine

## 2024-04-11 ENCOUNTER — Inpatient Hospital Stay: Attending: Hematology and Oncology

## 2024-04-11 ENCOUNTER — Inpatient Hospital Stay: Admitting: Hematology and Oncology

## 2024-04-11 VITALS — BP 140/86 | HR 82 | Temp 98.0°F | Resp 16 | Ht 62.0 in | Wt 250.6 lb

## 2024-04-11 DIAGNOSIS — Z171 Estrogen receptor negative status [ER-]: Secondary | ICD-10-CM | POA: Diagnosis not present

## 2024-04-11 DIAGNOSIS — Z923 Personal history of irradiation: Secondary | ICD-10-CM | POA: Insufficient documentation

## 2024-04-11 DIAGNOSIS — Z853 Personal history of malignant neoplasm of breast: Secondary | ICD-10-CM | POA: Insufficient documentation

## 2024-04-11 DIAGNOSIS — D509 Iron deficiency anemia, unspecified: Secondary | ICD-10-CM | POA: Diagnosis not present

## 2024-04-11 DIAGNOSIS — Z9221 Personal history of antineoplastic chemotherapy: Secondary | ICD-10-CM | POA: Diagnosis not present

## 2024-04-11 DIAGNOSIS — Z79899 Other long term (current) drug therapy: Secondary | ICD-10-CM | POA: Diagnosis not present

## 2024-04-11 DIAGNOSIS — C50412 Malignant neoplasm of upper-outer quadrant of left female breast: Secondary | ICD-10-CM

## 2024-04-11 LAB — CMP (CANCER CENTER ONLY)
ALT: 10 U/L (ref 0–44)
AST: 11 U/L — ABNORMAL LOW (ref 15–41)
Albumin: 3.8 g/dL (ref 3.5–5.0)
Alkaline Phosphatase: 91 U/L (ref 38–126)
Anion gap: 7 (ref 5–15)
BUN: 9 mg/dL (ref 6–20)
CO2: 27 mmol/L (ref 22–32)
Calcium: 9.6 mg/dL (ref 8.9–10.3)
Chloride: 103 mmol/L (ref 98–111)
Creatinine: 0.53 mg/dL (ref 0.44–1.00)
GFR, Estimated: >60 mL/min (ref >60)
Glucose, Bld: 188 mg/dL — ABNORMAL HIGH (ref 70–99)
Potassium: 3.8 mmol/L (ref 3.5–5.1)
Sodium: 137 mmol/L (ref 135–145)
Total Bilirubin: 0.5 mg/dL (ref 0.0–1.2)
Total Protein: 7.2 g/dL (ref 6.5–8.1)

## 2024-04-11 LAB — CBC WITH DIFFERENTIAL (CANCER CENTER ONLY)
Abs Immature Granulocytes: 0.02 10*3/uL (ref 0.00–0.07)
Basophils Absolute: 0 10*3/uL (ref 0.0–0.1)
Basophils Relative: 1 %
Eosinophils Absolute: 0.4 10*3/uL (ref 0.0–0.5)
Eosinophils Relative: 5 %
HCT: 39.2 % (ref 36.0–46.0)
Hemoglobin: 12.5 g/dL (ref 12.0–15.0)
Immature Granulocytes: 0 %
Lymphocytes Relative: 23 %
Lymphs Abs: 1.8 10*3/uL (ref 0.7–4.0)
MCH: 24.1 pg — ABNORMAL LOW (ref 26.0–34.0)
MCHC: 31.9 g/dL (ref 30.0–36.0)
MCV: 75.5 fL — ABNORMAL LOW (ref 80.0–100.0)
Monocytes Absolute: 0.6 10*3/uL (ref 0.1–1.0)
Monocytes Relative: 7 %
Neutro Abs: 5.2 10*3/uL (ref 1.7–7.7)
Neutrophils Relative %: 64 %
Platelet Count: 343 10*3/uL (ref 150–400)
RBC: 5.19 MIL/uL — ABNORMAL HIGH (ref 3.87–5.11)
RDW: 14.6 % (ref 11.5–15.5)
WBC Count: 8 10*3/uL (ref 4.0–10.5)
nRBC: 0 % (ref 0.0–0.2)

## 2024-04-11 LAB — IRON AND IRON BINDING CAPACITY (CC-WL,HP ONLY)
Iron: 58 ug/dL (ref 28–170)
Saturation Ratios: 13 % (ref 10.4–31.8)
TIBC: 433 ug/dL (ref 250–450)
UIBC: 375 ug/dL (ref 148–442)

## 2024-04-11 LAB — FERRITIN: Ferritin: 28 ng/mL (ref 11–307)

## 2024-04-11 NOTE — Progress Notes (Addendum)
 Patient Care Team: Rollene Almarie LABOR, MD as PCP - General (Internal Medicine) Glean Stephane BROCKS, RN (Inactive) as Oncology Nurse Navigator Tyree Nanetta SAILOR, RN as Oncology Nurse Navigator Odean Potts, MD as Consulting Physician (Hematology and Oncology) Izell Domino, MD as Attending Physician (Radiation Oncology) Ethyl Lenis, MD as Consulting Physician (General Surgery)  DIAGNOSIS:  Encounter Diagnosis  Name Primary?   Malignant neoplasm of upper-outer quadrant of left breast in female, estrogen receptor negative (HCC) Yes    SUMMARY OF ONCOLOGIC HISTORY: Oncology History  Malignant neoplasm of upper-outer quadrant of left breast in female, estrogen receptor negative (HCC)  09/30/2019 Initial Diagnosis   Patient palpated left breast mass. Mammogram showed a 2.3cm mass at the 2 o'clock position, normal-appearing axillary lymph nodes. Biopsy showed IDC, grade 3, HER-2 + (3+), ER/PR -, Ki67 40%.   10/02/2019 Cancer Staging   Staging form: Breast, AJCC 8th Edition - Clinical stage from 10/02/2019: Stage IIA (cT2, cN0, cM0, G3, ER-, PR-, HER2+) - Signed by Odean Potts, MD on 10/02/2019   10/16/2019 - 12/10/2019 Chemotherapy   Taxol  Herceptin  neoadjuvant chemotherapy x5 (stopped early due to neuropathy toxicity)    Genetic Testing   Negative genetic testing. No pathogenic variants identified on the [facility] Common Hereditary Cancers Panel. The report date is 11/26/2019.  The Common Hereditary Cancers Panel offered by [facility] includes sequencing and/or deletion duplication testing of the following 48 genes: APC, ATM, AXIN2, BARD1, BMPR1A, BRCA1, BRCA2, BRIP1, CDH1, CDKN2A (p14ARF), CDKN2A (p16INK4a), CKD4, CHEK2, CTNNA1, DICER1, EPCAM (Deletion/duplication testing only), GREM1 (promoter region deletion/duplication testing only), KIT, MEN1, MLH1, MSH2, MSH3, MSH6, MUTYH, NBN, NF1, NHTL1, PALB2, PDGFRA, PMS2, POLD1, POLE, PTEN, RAD50, RAD51C, RAD51D, RNF43, SDHB, SDHC, SDHD, SMAD4,  SMARCA4. STK11, TP53, TSC1, TSC2, and VHL.  The following genes were evaluated for sequence changes only: SDHA and HOXB13 c.251G>A variant only.   12/11/2019 -  Chemotherapy   Herceptin  maintenance   01/07/2020 Surgery   Left lumpectomy Jeoffrey): no residual carcinoma, clear margins, 3 left axillary lymph nodes negative.   02/18/2020 - 03/16/2020 Radiation Therapy   Adjuvant radiation     CHIEF COMPLIANT: Surveillance of breast cancer  HISTORY OF PRESENT ILLNESS: Ms. Gillott 58 year old with above-mentioned history of breast cancer who is on surveillance.  She is doing quite well she had a recent mammogram which showed an abnormality which led to ultrasound and a biopsy that showed fat necrosis. She denies any lumps or nodules in the breast. Her daughter reports that her blood sugars are out of control and she is not following any of her diet and not taking any of her medications.  ALLERGIES:  has no known allergies.  MEDICATIONS:  Current Outpatient Medications  Medication Sig Dispense Refill   albuterol  (PROVENTIL ) (2.5 MG/3ML) 0.083% nebulizer solution Take 3 mLs (2.5 mg total) by nebulization every 8 (eight) hours. (Patient not taking: Reported on 03/18/2024) 75 mL 0   albuterol  (VENTOLIN  HFA) 108 (90 Base) MCG/ACT inhaler TAKE 2 PUFFS BY MOUTH EVERY 6 HOURS AS NEEDED FOR WHEEZE OR SHORTNESS OF BREATH 6.7 each 0   atenolol -chlorthalidone  (TENORETIC ) 100-25 MG tablet TAKE 1/2 TABLET BY MOUTH DAILY 45 tablet 4   budesonide -formoterol  (SYMBICORT ) 160-4.5 MCG/ACT inhaler INHALE 2 PUFFS INTO THE LUNGS IN THE MORNING AND AT BEDTIME 10.2 each 11   cetirizine  (ZYRTEC ) 10 MG tablet Take 1 tablet (10 mg total) by mouth daily. 30 tablet 11   ferrous gluconate  (FERGON) 324 MG tablet Take 1 tablet (324 mg total) by mouth daily with  breakfast. 90 tablet 3   glimepiride  (AMARYL ) 2 MG tablet TAKE 1 TABLET BY MOUTH DAILY BEFORE BREAKFAST. 90 tablet 1   levothyroxine  (SYNTHROID ) 100 MCG tablet Take 1  tablet (100 mcg total) by mouth daily. 90 tablet 3   lisinopril  (ZESTRIL ) 20 MG tablet TAKE 1 TABLET BY MOUTH EVERY DAY 90 tablet 3   rosuvastatin  (CRESTOR ) 10 MG tablet TAKE 1 TABLET BY MOUTH EVERY DAY 90 tablet 3   No current facility-administered medications for this visit.    PHYSICAL EXAMINATION: ECOG PERFORMANCE STATUS: 1 - Symptomatic but completely ambulatory  Vitals:   04/11/24 0941  BP: (!) 140/86  Pulse: 82  Resp: 16  Temp: 98 F (36.7 C)  SpO2: 100%   Filed Weights   04/11/24 0941  Weight: 250 lb 9.6 oz (113.7 kg)     LABORATORY DATA:  I have reviewed the data as listed    Latest Ref Rng & Units 04/11/2024    9:27 AM 03/18/2024    4:26 PM 02/16/2023    9:42 AM  CMP  Glucose 70 - 99 mg/dL 811  794  832   BUN 6 - 20 mg/dL 9  12  15    Creatinine 0.44 - 1.00 mg/dL 9.46  9.45  9.32   Sodium 135 - 145 mmol/L 137  135  138   Potassium 3.5 - 5.1 mmol/L 3.8  3.8  3.6   Chloride 98 - 111 mmol/L 103  98  100   CO2 22 - 32 mmol/L 27  30  30    Calcium  8.9 - 10.3 mg/dL 9.6  9.7  89.9   Total Protein 6.5 - 8.1 g/dL 7.2  7.5  7.9   Total Bilirubin 0.0 - 1.2 mg/dL 0.5  0.3  0.5   Alkaline Phos 38 - 126 U/L 91  94  99   AST 15 - 41 U/L 11  10  13    ALT 0 - 44 U/L 10  11  12      Lab Results  Component Value Date   WBC 8.0 04/11/2024   HGB 12.5 04/11/2024   HCT 39.2 04/11/2024   MCV 75.5 (L) 04/11/2024   PLT 343 04/11/2024   NEUTROABS 5.2 04/11/2024    ASSESSMENT & PLAN:  Malignant neoplasm of upper-outer quadrant of left breast in female, estrogen receptor negative (HCC) 09/30/2019: Patient palpated left breast mass. Mammogram showed a 2.3cm mass at the 2 o'clock position, normal-appearing axillary lymph nodes. Biopsy showed IDC, grade 3, HER-2 + (3+), ER/PR -, Ki67 40%. T2 N0 stage IIa clinical stage   Treatment plan: 1.  Neoadjuvant chemotherapy with Taxol -Herceptin  weekly X 5 (neuropathy caused discontinuation of Taxol ) followed by Herceptin  maintenance for 1  year completed 07/29/2020 2.  Breast conserving surgery with sentinel lymph node biopsy (Dr.Newman) 01/07/20: Path CR, 0/3 LN 3.  Adjuvant radiation started 02/18/2020-03/20/20 ----------------------------------------------------------------------------------------------------------------------------------------------------- Plan: Surveillance.    Microcytic anemia: Secondary to iron deficiency anemia: Currently on oral iron therapy and tolerating it fairly well.  08/23/2022: Hemoglobin 13.2, MCV 77.4 I would like to repeat CBC iron studies CMP because she is feeling fatigued.   Uncontrolled diabetes: Once again stressed the importance of diet exercise and discussed with primary care physician about option of using a GLP-1 medications for weight loss and for diabetic control.  Her last A1c was 9.6.  She is extremely noncompliant with her pills.  Breast cancer surveillance:  Mammogram 01/05/2023 benign breast density category B   Since she completed 5 years of follow-up  she can return back to see us  on an as-needed basis.     No orders of the defined types were placed in this encounter.  The patient has a good understanding of the overall plan. she agrees with it. she will call with any problems that may develop before the next visit here. Total time spent: 30 mins including face to face time and time spent for planning, charting and co-ordination of care   Viinay K Calianna Kim, MD 04/11/24

## 2024-04-11 NOTE — Telephone Encounter (Signed)
 Copied from CRM [id number]. Topic: Clinical - Medical Advice >> Apr 11, 2024 12:15 PM Drema MATSU wrote: Reason for CRM: Patient daughter wants to know if mom can try Mounjaro to control her diabetes. She is also requesting a callback.

## 2024-04-17 NOTE — Telephone Encounter (Signed)
**Note De-identified  [place] Obfuscation** Please advise 

## 2024-04-17 NOTE — Telephone Encounter (Signed)
 Copied from CRM [id number]. Topic: Clinical - Medical Advice >> Apr 17, 2024  9:12 AM Silvana PARAS wrote: Reason for CRM: Patient daughter wants to know if mom can try Mounjaro to control her diabetes. Callback number is [phone].

## 2024-04-19 MED ORDER — TIRZEPATIDE 7.5 MG/0.5ML ~~LOC~~ SOAJ: 7.5000 mg | SUBCUTANEOUS | 0 refills | Status: DC

## 2024-04-19 MED ORDER — TIRZEPATIDE 2.5 MG/0.5ML ~~LOC~~ SOAJ: 2.5000 mg | SUBCUTANEOUS | 0 refills | Status: DC

## 2024-04-19 MED ORDER — TIRZEPATIDE 5 MG/0.5ML ~~LOC~~ SOAJ: 5.0000 mg | SUBCUTANEOUS | 0 refills | Status: DC

## 2024-04-19 NOTE — Telephone Encounter (Signed)
 Patient daughter wanted to know should mom start checking her sugars at home and also if she should be taking the Atenol and lisinopril  together.

## 2024-04-19 NOTE — Telephone Encounter (Signed)
 Yes she can we have sent in first 3 months. Month 1 take 2.5 mg weekly, month 2 take 5 mg weekly, month 3 take 7.5 mg weekly then follow up

## 2024-04-20 ENCOUNTER — Other Ambulatory Visit: Payer: Self-pay | Admitting: Internal Medicine

## 2024-04-20 DIAGNOSIS — E1169 Type 2 diabetes mellitus with other specified complication: Secondary | ICD-10-CM

## 2024-04-22 ENCOUNTER — Other Ambulatory Visit: Payer: Self-pay | Admitting: Internal Medicine

## 2024-04-22 DIAGNOSIS — I10 Essential (primary) hypertension: Secondary | ICD-10-CM

## 2024-04-22 NOTE — Telephone Encounter (Signed)
 If she wants to it would be okay for her to check sugars daily but not needed. Yes she should take both of her medications atenolol  and lisinopril .

## 2024-04-23 NOTE — Telephone Encounter (Signed)
 Called the daughter back and went over questions and concerns. Patient Is also coming in to have a teaching done on injection her monjouro

## 2024-04-23 NOTE — Telephone Encounter (Signed)
 Copied from CRM [id number]. Topic: Clinical - Medication Question >> Apr 23, 2024  2:10 PM Chiquita SQUIBB wrote: Reason for CRM: Patients daughter is calling in for an update from the nurse regarding the patients medication questions.

## 2024-04-26 ENCOUNTER — Ambulatory Visit

## 2024-04-26 NOTE — Telephone Encounter (Signed)
 Patient's daughter calls in to inform the office that at the point of patient taking labs, she was not regularly taking her glimepiride .   Patient's daughter wants to make sure that she should have the glimepiride  and the mounjaro  on medication regiment?  Also okay to send prescription order in for glucometer kit?

## 2024-04-30 MED ORDER — LANCETS MISC. MISC
1.0000 | Freq: Three times a day (TID) | 0 refills | Status: AC
Start: 2024-04-30 — End: 2024-05-30

## 2024-04-30 MED ORDER — BLOOD GLUCOSE TEST VI STRP: 1.0000 | ORAL_STRIP | Freq: Three times a day (TID) | 0 refills | Status: DC

## 2024-04-30 MED ORDER — LANCET DEVICE MISC: 1.0000 | Freq: Three times a day (TID) | 0 refills | Status: AC

## 2024-04-30 MED ORDER — BLOOD GLUCOSE MONITORING SUPPL DEVI: 1.0000 | Freq: Three times a day (TID) | 0 refills | Status: AC

## 2024-04-30 NOTE — Telephone Encounter (Signed)
 Kit has been sent in and Patient daughter has been advised

## 2024-04-30 NOTE — Addendum Note (Signed)
 Addended by: ROSALVA LEX RAMAN on: 04/30/2024 11:13 AM   Modules accepted: Orders

## 2024-05-22 ENCOUNTER — Other Ambulatory Visit: Payer: Self-pay | Admitting: Hematology and Oncology

## 2024-05-22 DIAGNOSIS — I10 Essential (primary) hypertension: Secondary | ICD-10-CM

## 2024-06-21 ENCOUNTER — Ambulatory Visit: Admitting: Internal Medicine

## 2024-07-19 ENCOUNTER — Ambulatory Visit: Admitting: Internal Medicine

## 2024-07-22 ENCOUNTER — Ambulatory Visit: Admitting: Internal Medicine

## 2024-07-22 ENCOUNTER — Encounter: Payer: Self-pay | Admitting: Internal Medicine

## 2024-07-22 VITALS — BP 122/84 | HR 77 | Temp 97.6°F | Ht 62.0 in | Wt 245.0 lb

## 2024-07-22 DIAGNOSIS — I10 Essential (primary) hypertension: Secondary | ICD-10-CM | POA: Diagnosis not present

## 2024-07-22 DIAGNOSIS — Z7984 Long term (current) use of oral hypoglycemic drugs: Secondary | ICD-10-CM

## 2024-07-22 DIAGNOSIS — E118 Type 2 diabetes mellitus with unspecified complications: Secondary | ICD-10-CM

## 2024-07-22 LAB — POCT GLYCOSYLATED HEMOGLOBIN (HGB A1C): Hemoglobin A1C: 7.5 % — AB (ref 4.0–5.6)

## 2024-07-22 NOTE — Patient Instructions (Signed)
 The HgA1c is 7.5

## 2024-07-22 NOTE — Progress Notes (Unsigned)
 Subjective:   Patient ID: Veronica Velazquez, female    DOB: 02/19/1966, 58 y.o.   MRN: [id number]  Discussed the use of AI scribe software for clinical note transcription with the patient, who gave verbal consent to proceed. Translator was declined and patient's daughter translated during the visit and was present throughout.   History of Present Illness Veronica Velazquez is a 59 year old female with type 2 diabetes mellitus who presents for a follow-up visit to evaluate her blood sugar control.  She reports improved blood sugar control since her last visit, with her A1c decreasing from 9.7% to 7.5%. She is currently taking glimepiride  daily in the morning. She did not start the injectable medication previously discussed due to confusion about potential interactions with her current medication. She has lost approximately 7 pounds since June, now weighing 245 pounds, down from 252 pounds. She attributes this weight loss to dietary changes and increased physical activity, focusing on reducing sugar and carbohydrate intake.  She has a history of hypertension and is currently taking atenolol -chlorthalidone  and lisinopril .  Her brother passed away in 05-Aug-2014, possibly from a heart attack, and she had a cardiac workup at that time.  She acknowledges limited physical activity due to shortness of breath and difficulty standing for long periods.  Review of Systems  Constitutional: Negative.   HENT: Negative.    Eyes: Negative.   Respiratory:  Negative for cough, chest tightness and shortness of breath.   Cardiovascular:  Negative for chest pain, palpitations and leg swelling.  Gastrointestinal:  Negative for abdominal distention, abdominal pain, constipation, diarrhea, nausea and vomiting.  Musculoskeletal: Negative.   Skin: Negative.   Neurological: Negative.   Psychiatric/Behavioral: Negative.      Objective:  Physical Exam Constitutional:      Appearance: She is well-developed.  She is obese.  HENT:     Head: Normocephalic and atraumatic.  Cardiovascular:     Rate and Rhythm: Normal rate and regular rhythm.  Pulmonary:     Effort: Pulmonary effort is normal. No respiratory distress.     Breath sounds: Normal breath sounds. No wheezing or rales.  Abdominal:     General: Bowel sounds are normal. There is no distension.     Palpations: Abdomen is soft.     Tenderness: There is no abdominal tenderness.  Musculoskeletal:     Cervical back: Normal range of motion.  Skin:    General: Skin is warm and dry.  Neurological:     Mental Status: She is alert and oriented to person, place, and time.     Coordination: Coordination normal.     Vitals:   07/22/24 1600  BP: 122/84  Pulse: 77  Temp: 97.6 F (36.4 C)  TempSrc: Temporal  SpO2: 96%  Weight: 245 lb (111.1 kg)  Height: 5' 2 (1.575 m)    Assessment and Plan Assessment & Plan Type 2 diabetes mellitus   Glycemic control has improved with Hemoglobin A1c reduced to 7.5%, managed with glimepiride . Weight loss is noted, likely due to lifestyle changes. Continue glimepiride  as prescribed. Encourage dietary modifications reducing sugars and carbohydrates, and regular physical activity for weight management and glycemic control. Advise monitoring blood glucose if symptomatic of hypoglycemia or hyperglycemia.  Hypertension   Blood pressure is well-controlled at 122/84 mmHg with atenolol /chlorthalidone  and lisinopril , which also provides renal protection. Continue both medications as prescribed. Monitor for symptoms of hypotension such as dizziness or lightheadedness.  Morbid Obesity   She has lost 7 pounds due  to dietary changes and increased activity. Continued weight loss is encouraged to reduce obesity-related risks. Encourage further weight loss through dietary modifications and increased physical activity. Discuss a gradual increase in physical activity starting with short durations.

## 2024-07-24 NOTE — Assessment & Plan Note (Signed)
 Blood pressure is well-controlled at 122/84 mmHg with atenolol /chlorthalidone  and lisinopril , which also provides renal protection. Continue both medications as prescribed. Monitor for symptoms of hypotension such as dizziness or lightheadedness.

## 2024-07-24 NOTE — Assessment & Plan Note (Signed)
 Glycemic control has improved with Hemoglobin A1c reduced to 7.5%, managed with glimepiride . Weight loss is noted, likely due to lifestyle changes. Continue glimepiride  as prescribed. Encourage dietary modifications reducing sugars and carbohydrates, and regular physical activity for weight management and glycemic control. Advise monitoring blood glucose if symptomatic of hypoglycemia or hyperglycemia.

## 2024-07-24 NOTE — Assessment & Plan Note (Signed)
 She has lost 7 pounds due to dietary changes and increased activity. Continued weight loss is encouraged to reduce obesity-related risks. Encourage further weight loss through dietary modifications and increased physical activity. Discuss a gradual increase in physical activity starting with short durations.

## 2024-08-03 ENCOUNTER — Other Ambulatory Visit: Payer: Self-pay | Admitting: Internal Medicine

## 2024-08-03 DIAGNOSIS — E118 Type 2 diabetes mellitus with unspecified complications: Secondary | ICD-10-CM

## 2024-08-14 ENCOUNTER — Other Ambulatory Visit: Payer: Self-pay | Admitting: Internal Medicine

## 2024-09-19 ENCOUNTER — Other Ambulatory Visit: Payer: Self-pay | Admitting: Hematology and Oncology

## 2024-09-19 DIAGNOSIS — M7989 Other specified soft tissue disorders: Secondary | ICD-10-CM

## 2024-10-08 ENCOUNTER — Encounter: Payer: Self-pay | Admitting: Internal Medicine

## 2024-10-08 ENCOUNTER — Ambulatory Visit: Admitting: Internal Medicine

## 2024-10-08 VITALS — BP 130/80 | HR 58 | Temp 98.1°F | Ht 62.0 in | Wt 246.8 lb

## 2024-10-08 DIAGNOSIS — J069 Acute upper respiratory infection, unspecified: Secondary | ICD-10-CM

## 2024-10-08 DIAGNOSIS — E119 Type 2 diabetes mellitus without complications: Secondary | ICD-10-CM | POA: Diagnosis not present

## 2024-10-08 DIAGNOSIS — E118 Type 2 diabetes mellitus with unspecified complications: Secondary | ICD-10-CM | POA: Diagnosis not present

## 2024-10-08 LAB — POCT GLYCOSYLATED HEMOGLOBIN (HGB A1C): Hemoglobin A1C: 7.7 % — AB (ref 4.0–5.6)

## 2024-10-08 LAB — POC COVID19 BINAXNOW: SARS Coronavirus 2 Ag: NEGATIVE

## 2024-10-08 LAB — POCT INFLUENZA A/B
Influenza A, POC: NEGATIVE
Influenza B, POC: NEGATIVE

## 2024-10-08 MED ORDER — PROMETHAZINE-DM 6.25-15 MG/5ML PO SYRP: 5.0000 mL | ORAL_SOLUTION | Freq: Four times a day (QID) | ORAL | 0 refills | Status: AC | PRN

## 2024-10-08 NOTE — Progress Notes (Signed)
 "  Subjective:   Patient ID: Veronica Velazquez, female    DOB: Oct 21, 1965, 58 y.o.   MRN: [id number]  Discussed the use of AI scribe software for clinical note transcription with the patient, who gave verbal consent to proceed.  History of Present Illness Veronica Velazquez is a 58 year old female with diabetes who presents for diabetes management and recent cold symptoms. She did decline interpretor and used daughter as interpretor who was present throughout the visit and helped provide history as well.   She has been experiencing cold symptoms for the past two to three days. Many people around her have been sick. No new chest pains, tightness, or pressure, and her breathing remains unchanged. No new stomach issues, diarrhea, or constipation.  Her diabetes management is ongoing, with her hemoglobin A1c currently at 7.7, slightly up from 7.5 previously. Her morning blood sugar levels are targeted to be around 150 mg/dL. Her weight has remained stable, with a slight increase of two pounds since the last visit.  Review of Systems  Constitutional:  Positive for activity change, appetite change and chills. Negative for fatigue, fever and unexpected weight change.  HENT:  Positive for congestion, postnasal drip, rhinorrhea and sinus pressure. Negative for ear discharge, ear pain, sinus pain, sneezing, sore throat, tinnitus, trouble swallowing and voice change.   Eyes: Negative.   Respiratory:  Positive for cough. Negative for chest tightness, shortness of breath and wheezing.   Cardiovascular: Negative.   Gastrointestinal: Negative.   Musculoskeletal:  Positive for myalgias.  Neurological: Negative.     Objective:  Physical Exam Constitutional:      Appearance: She is well-developed. She is obese.  HENT:     Head: Normocephalic and atraumatic.  Cardiovascular:     Rate and Rhythm: Normal rate and regular rhythm.  Pulmonary:     Effort: Pulmonary effort is normal. No respiratory  distress.     Breath sounds: Normal breath sounds. No wheezing or rales.  Abdominal:     General: Bowel sounds are normal. There is no distension.     Palpations: Abdomen is soft.     Tenderness: There is no abdominal tenderness.  Musculoskeletal:     Cervical back: Normal range of motion.  Skin:    General: Skin is warm and dry.  Neurological:     Mental Status: She is alert and oriented to person, place, and time.     Coordination: Coordination normal.     Vitals:   10/08/24 0901  BP: 130/80  Pulse: (!) 58  Temp: 98.1 F (36.7 C)  TempSrc: Oral  SpO2: 98%  Weight: 246 lb 12.8 oz (111.9 kg)  Height: 5' 2 (1.575 m)    Assessment and Plan Assessment & Plan Type 2 diabetes mellitus   Hemoglobin A1c is 7.7, consistent with the previous 7.5, indicating well-managed diabetes. POC HgA1c was done today slightly early due to inconvenience of returning for labs and some concerns from family with dietary changes and lapse in control. The goal is to maintain A1c around 7.5 with a current fasting blood glucose target of 150 mg/dL. Discussed dietary and exercise modifications to lower blood glucose, emphasizing lifestyle changes despite low motivation. Provided educational materials on diet and exercise for diabetes management. Advised checking blood glucose a couple of times a week in the morning.  Viral URI Flu and covid-19 POC testing done as symptoms started 2-3 days ago and sick exposure. These are negative. Rx promethazine /dm cough syrup and counseled on expected  timeline for recovery.   Morbid obesity   Weight increased by two pounds since the last visit, likely due to the holiday season, although previous weight loss was noted. Discussed the importance of weight management in conjunction with diabetes control and encouraged weight management strategies. Given and counseled about dietary information.   "

## 2024-10-09 ENCOUNTER — Encounter

## 2024-10-09 ENCOUNTER — Other Ambulatory Visit

## 2024-10-17 LAB — OPHTHALMOLOGY REPORT-SCANNED

## 2024-11-07 ENCOUNTER — Encounter: Payer: Self-pay | Admitting: *Deleted

## 2024-11-07 NOTE — Progress Notes (Signed)
 Veronica Velazquez                                          MRN: [id number]   11/07/2024   The [facility] Quality Team Specialist reviewed this patient medical record for the purposes of chart review for care gap closure. The following were reviewed: abstraction for care gap closure-kidney health evaluation for diabetes:eGFR  and uACR.    [facility] Quality Team

## 2024-11-09 ENCOUNTER — Other Ambulatory Visit: Payer: Self-pay | Admitting: Internal Medicine
